# Patient Record
Sex: Male | Born: 1935 | Race: White | Hispanic: No | Smoking: Never smoker
Health system: Southern US, Community
[De-identification: ages and names within clinical notes are randomized; demographics above are authoritative.]

## PROBLEM LIST (undated history)

## (undated) DIAGNOSIS — E785 Hyperlipidemia, unspecified: Secondary | ICD-10-CM

## (undated) DIAGNOSIS — I5033 Acute on chronic diastolic (congestive) heart failure: Secondary | ICD-10-CM

## (undated) DIAGNOSIS — I5032 Chronic diastolic (congestive) heart failure: Secondary | ICD-10-CM

## (undated) DIAGNOSIS — C4490 Unspecified malignant neoplasm of skin, unspecified: Secondary | ICD-10-CM

## (undated) DIAGNOSIS — J90 Pleural effusion, not elsewhere classified: Secondary | ICD-10-CM

## (undated) DIAGNOSIS — I4891 Unspecified atrial fibrillation: Secondary | ICD-10-CM

## (undated) DIAGNOSIS — I341 Nonrheumatic mitral (valve) prolapse: Secondary | ICD-10-CM

## (undated) DIAGNOSIS — Z951 Presence of aortocoronary bypass graft: Secondary | ICD-10-CM

## (undated) DIAGNOSIS — I2510 Atherosclerotic heart disease of native coronary artery without angina pectoris: Secondary | ICD-10-CM

## (undated) DIAGNOSIS — I639 Cerebral infarction, unspecified: Secondary | ICD-10-CM

## (undated) DIAGNOSIS — Z9889 Other specified postprocedural states: Secondary | ICD-10-CM

## (undated) DIAGNOSIS — I340 Nonrheumatic mitral (valve) insufficiency: Secondary | ICD-10-CM

## (undated) HISTORY — DX: Unspecified atrial fibrillation: I48.91

## (undated) HISTORY — DX: Hyperlipidemia, unspecified: E78.5

## (undated) HISTORY — DX: Unspecified malignant neoplasm of skin, unspecified: C44.90

## (undated) HISTORY — PX: SKIN CANCER EXCISION: SHX779

## (undated) HISTORY — DX: Nonrheumatic mitral (valve) prolapse: I34.1

## (undated) HISTORY — DX: Atherosclerotic heart disease of native coronary artery without angina pectoris: I25.10

## (undated) HISTORY — DX: Presence of aortocoronary bypass graft: Z95.1

## (undated) HISTORY — DX: Cerebral infarction, unspecified: I63.9

## (undated) HISTORY — PX: LITHOTRIPSY: SUR834

## (undated) HISTORY — DX: Pleural effusion, not elsewhere classified: J90

---

## 1999-05-23 ENCOUNTER — Encounter: Payer: Self-pay | Admitting: Urology

## 1999-05-23 ENCOUNTER — Ambulatory Visit (HOSPITAL_COMMUNITY): Admission: RE | Admit: 1999-05-23 | Discharge: 1999-05-23 | Payer: Self-pay | Admitting: Urology

## 1999-06-27 ENCOUNTER — Ambulatory Visit (HOSPITAL_COMMUNITY): Admission: RE | Admit: 1999-06-27 | Discharge: 1999-06-27 | Payer: Self-pay | Admitting: Gastroenterology

## 2001-07-09 ENCOUNTER — Ambulatory Visit (HOSPITAL_COMMUNITY): Admission: RE | Admit: 2001-07-09 | Discharge: 2001-07-09 | Payer: Self-pay | Admitting: Emergency Medicine

## 2004-05-31 ENCOUNTER — Ambulatory Visit (HOSPITAL_COMMUNITY): Admission: RE | Admit: 2004-05-31 | Discharge: 2004-05-31 | Payer: Self-pay | Admitting: Gastroenterology

## 2005-09-20 ENCOUNTER — Encounter: Admission: RE | Admit: 2005-09-20 | Discharge: 2005-09-20 | Payer: Self-pay | Admitting: Emergency Medicine

## 2005-09-28 ENCOUNTER — Encounter: Admission: RE | Admit: 2005-09-28 | Discharge: 2005-09-28 | Payer: Self-pay | Admitting: Emergency Medicine

## 2006-09-28 ENCOUNTER — Encounter: Admission: RE | Admit: 2006-09-28 | Discharge: 2006-09-28 | Payer: Self-pay | Admitting: Emergency Medicine

## 2008-06-23 ENCOUNTER — Encounter: Admission: RE | Admit: 2008-06-23 | Discharge: 2008-06-23 | Payer: Self-pay | Admitting: Emergency Medicine

## 2009-07-21 ENCOUNTER — Ambulatory Visit: Payer: Self-pay | Admitting: Vascular Surgery

## 2009-12-25 HISTORY — PX: CORONARY STENT PLACEMENT: SHX1402

## 2010-07-07 ENCOUNTER — Encounter (INDEPENDENT_AMBULATORY_CARE_PROVIDER_SITE_OTHER): Payer: Self-pay | Admitting: Emergency Medicine

## 2010-07-07 ENCOUNTER — Ambulatory Visit (HOSPITAL_COMMUNITY): Admission: RE | Admit: 2010-07-07 | Discharge: 2010-07-07 | Payer: Self-pay | Admitting: Emergency Medicine

## 2010-07-26 ENCOUNTER — Encounter: Payer: Self-pay | Admitting: Cardiovascular Disease

## 2010-07-29 ENCOUNTER — Encounter: Payer: Self-pay | Admitting: Cardiovascular Disease

## 2010-07-29 ENCOUNTER — Encounter: Admission: RE | Admit: 2010-07-29 | Discharge: 2010-07-29 | Payer: Self-pay | Admitting: Cardiology

## 2010-08-02 ENCOUNTER — Ambulatory Visit: Admission: RE | Admit: 2010-08-02 | Payer: Self-pay | Source: Home / Self Care | Admitting: Cardiology

## 2010-08-04 ENCOUNTER — Ambulatory Visit: Payer: Self-pay | Admitting: Cardiovascular Disease

## 2010-08-04 DIAGNOSIS — I2510 Atherosclerotic heart disease of native coronary artery without angina pectoris: Secondary | ICD-10-CM

## 2010-08-04 DIAGNOSIS — I25810 Atherosclerosis of coronary artery bypass graft(s) without angina pectoris: Secondary | ICD-10-CM

## 2010-08-04 HISTORY — DX: Atherosclerotic heart disease of native coronary artery without angina pectoris: I25.10

## 2010-08-04 HISTORY — DX: Atherosclerosis of coronary artery bypass graft(s) without angina pectoris: I25.810

## 2010-08-09 ENCOUNTER — Ambulatory Visit: Payer: Self-pay | Admitting: Cardiovascular Disease

## 2010-08-09 ENCOUNTER — Ambulatory Visit (HOSPITAL_COMMUNITY): Admission: RE | Admit: 2010-08-09 | Discharge: 2010-08-10 | Payer: Self-pay | Admitting: Cardiovascular Disease

## 2011-01-14 ENCOUNTER — Encounter: Payer: Self-pay | Admitting: Emergency Medicine

## 2011-01-26 NOTE — Letter (Signed)
Summary: Cardiac Catheterization Instructions- Main Lab  [employer], Main Office  [address]   [place], [place] [address]   Phone: [phone]  Fax: [phone]     08/04/2010 MRN: [id number]  [facility] [address] [place], [place]  [address]  Dear Mr. Jaffe,   You are scheduled for Cardiac Catheterization on Tuesday August 09, 2010             with Dr. Excell Seltzer.  Please arrive at the [facility] at 7:00      a.m. on the day of your procedure.  1. DIET     _X___ Nothing to eat or drink after midnight except your medications with a sip of water.  2. MAKE SURE YOU TAKE YOUR ASPIRIN AND PLAVIX.  3. _X___ YOU MAY TAKE ALL of your remaining medications with a small amount of water.       4. Plan for one night stay - bring personal belongings (i.e. toothpaste, toothbrush, etc.)  5. Bring a current list of your medications and current insurance cards.  6. Must have a responsible person to drive you home.   7. Someone must be with you for the first 24 hours after you arrive home.  8. Please wear clothes that are easy to get on and off and wear slip-on shoes.  *Special note: Every effort is made to have your procedure done on time.  Occasionally there are emergencies that present themselves at the hospital that may cause delays.  Please be patient if a delay does occur.  If you have any questions after you get home, please call the office at the number listed above.  Julieta Gutting, RN, BSN

## 2011-01-26 NOTE — Letter (Signed)
Summary: [facility]s Office visit Note  [facility] Office visit Note   Imported By: Roderic Ovens 08/30/2010 16:30:59  _____________________________________________________________________  External Attachment:    Type:   Image     Comment:   External Document

## 2011-01-26 NOTE — Cardiovascular Report (Signed)
Summary: Pre-Cath Orders   Pre-Cath Orders   Imported By: Marylou Mccoy 10/05/2010 07:40:29  _____________________________________________________________________  External Attachment:    Type:   Image     Comment:   External Document

## 2011-01-26 NOTE — Assessment & Plan Note (Signed)
Summary: discuss angioplasty   Visit Type:  Initial Consult Referring Hans Rusher:  Viann Fish, M.D. Primary Sky Primo:  Leslee Home, M.D.  CC:  Discuss angioplasty.  History of Present Illness: This is a 77 year-old gentleman referred by Dr Donnie Aho for consideration of PCI. The patient has stable exertional angina, and was recently evaluated by Dr Donnie Aho after an abnormal LV wall motion of a 2D echocardiogram. He underwent exercise stress testing demonstrating anterior ST elevation and inferior ST depression. He also had angina on the treadmill. Diagnostic catheterization was performed and this showed severe stenosis of the proximal LAD/diagonal bifurcation, with the most significant disease in the LAD.  The patient otherwise had nonobstructive disease of the LCx and RCA.  The patient reports exertional chest tightness, which he initially minimized, but now clearly admits to. His symptoms feel like chest tightness and resolve with rest. He denies dyspnea, palitations, lightheadedness, or syncope. He complains of mild leg swelling, worse with the hot temperature.   He has no history of bleeding abnormality or thromboembolism. He does have a longstanding history of mitral valve prolapse but no other past cardiac problems.  Current Medications (verified): 1)  Actonel 35 Mg Tabs (Risedronate Sodium) .... Once A Week 2)  Aspirin 81 Mg Tbec (Aspirin) .... Take 1 Tablet By Mouth Two Times A Day 3)  Multivitamins  Tabs (Multiple Vitamin) .... Take 1 Tablet By Mouth Once A Day 4)  Vitamin D3 1000 Unit Tabs (Cholecalciferol) .... Take 1 Tablet By Mouth Once A Day 5)  Metoprolol Tartrate 25 Mg Tabs (Metoprolol Tartrate) .... Take One Tablet By Mouth Twice A Day 6)  Caltrate 600+d Plus 600-400 Mg-Unit Tabs (Calcium Carbonate-Vit D-Min) .... Take 1 Tablet By Mouth Two Times A Day 7)  Singulair 10 Mg Tabs (Montelukast Sodium) .... Take 1 Tablet By Mouth Once A Day 8)  Crestor Dose ? Marland Kitchen... Take One Tablet  By Mouth Daily. 9)  Plavix 75 Mg Tabs (Clopidogrel Bisulfate) .... Take One Tablet By Mouth Daily  Allergies (verified): No Known Drug Allergies  Past History:  Past medical, surgical, family and social histories (including risk factors) reviewed, and no changes noted (except as noted below).  Past Medical History: CAD with stable angina, severe LAD stenosis at cath August 2011 MVP with MR Hypercholesterolemia Osteopenia    Past Surgical History: Lithotripsy Skin cancer removal  Family History: Reviewed history from 08/03/2010 and no changes required.   Negative for premature atherosclerotic disease.   Social History: Reviewed history from 08/03/2010 and no changes required.  He is widowed.  He has 2 children.  He is retired from the [employer].  He does not smoke cigarettes. Social Etoh. He reports regular exercise.   Review of Systems       Negative except as per HPI   Vital Signs:  Patient profile:   77 year old male Height:      70 inches Weight:      161 pounds BMI:     23.18 Pulse rate:   68 / minute Pulse rhythm:   regular Resp:     18 per minute BP sitting:   118 / 64  (left arm) Cuff size:   large  Vitals Entered By: Vikki Ports (August 04, 2010 3:41 PM)  Physical Exam  General:  Pt is well-developed, alert and oriented, no acute distress HEENT: normal Neck: no thyromegaly           JVP normal, carotid upstrokes normal without bruits Lungs: CTA Chest:  equal expansion  CV: Apical impulse nondisplaced, RRR with 2/6 late systolic murmur at apex Abd: soft, NT, positive BS, no HSM, no bruit Back: no CVA tenderness Ext: trace bilateral pretibial edema        femoral pulses 2+ without bruits        pedal pulses 2+ and equal        telangectasias/varicosities both legs Skin: warm, dry, no rash Neuro: CNII-XII intact,strength 5/5 = b/l    Impression & Recommendations:  Problem # 1:  CORONARY ATHEROSCLEROSIS NATIVE CORONARY ARTERY  (ICD-414.01) I reviewed Mr Holle's catheterization films in detail and discussed his case with Dr Donnie Aho. With exertional angina and significant exercise-induced ischemic EKG changes I think he has a strong indication for revascularization. With single vessel CAD and preserved LVEF in a nondiabetic patient, PCI is certainly appropriate. He does carry a higher risk of periprocedural infarct because of the bifurcational nature of his CAD and he understands this. His diagonal stenosis is not severe and I suspect it will remain patent even with stenting across it.  Will plan on a drug-eluting stent platform and I discussed the need for long-term (at least 12 months) dual antiplatelet therapy with ASA and Plavix. Dr Donnie Aho has already started the patient on all appropriate therapies for his CAD and these will be continued through his interventional procedure. Risks, indication, and alternatives to PCI were reviewed with the patient in detail and he agrees to proceed. His procedure will be scheduled for next week.  Will plan on a transradial approach.  His updated medication list for this problem includes:    Aspirin 81 Mg Tbec (Aspirin) .Marland Kitchen... Take 1 tablet by mouth two times a day    Metoprolol Tartrate 25 Mg Tabs (Metoprolol tartrate) .Marland Kitchen... Take one tablet by mouth twice a day    Plavix 75 Mg Tabs (Clopidogrel bisulfate) .Marland Kitchen... Take one tablet by mouth daily  Orders: Cardiac Catheterization (Cardiac Cath)  Patient Instructions: 1)  Your physician has requested that you have a cardiac catheterization.  Cardiac catheterization is used to diagnose and/or treat various heart conditions. Doctors may recommend this procedure for a number of different reasons. The most common reason is to evaluate chest pain. Chest pain can be a symptom of coronary artery disease (CAD), and cardiac catheterization can show whether plaque is narrowing or blocking your heart's arteries. This procedure is also used to evaluate the  valves, as well as measure the blood flow and oxygen levels in different parts of your heart.  For further information please visit [email or web address]  Please follow instruction sheet, as given.

## 2011-01-26 NOTE — Cardiovascular Report (Signed)
Summary: Pre-Cath Orders  Pre-Cath Orders   Imported By: Marylou Mccoy 10/05/2010 07:44:42  _____________________________________________________________________  External Attachment:    Type:   Image     Comment:   External Document

## 2011-03-10 LAB — BASIC METABOLIC PANEL
BUN: 15 mg/dL (ref 6–23)
CO2: 29 mEq/L (ref 19–32)
GFR calc Af Amer: >60 mL/min (ref >60)
Glucose, Bld: 82 mg/dL (ref 70–99)

## 2011-03-10 LAB — BASIC METABOLIC PANEL WITH GFR
BUN: 19 mg/dL (ref 6–23)
CO2: 30 meq/L (ref 19–32)
Calcium: 9.3 mg/dL (ref 8.4–10.5)
Chloride: 105 meq/L (ref 96–112)
Creatinine, Ser: 1.02 mg/dL (ref 0.4–1.5)
GFR calc non Af Amer: >60 mL/min
Glucose, Bld: 87 mg/dL (ref 70–99)
Potassium: 4 meq/L (ref 3.5–5.1)
Sodium: 141 meq/L (ref 135–145)

## 2011-03-10 LAB — CBC
HCT: 39.6 % (ref 39.0–52.0)
Hemoglobin: 11.7 g/dL — ABNORMAL LOW (ref 13.0–17.0)
Hemoglobin: 11.7 g/dL — ABNORMAL LOW (ref 13.0–17.0)
MCH: 30.8 pg (ref 26.0–34.0)
MCHC: 32.1 g/dL (ref 30.0–36.0)
MCHC: 32.3 g/dL (ref 30.0–36.0)
MCV: 96.1 fL (ref 78.0–100.0)
MCV: 96.1 fL (ref 78.0–100.0)
Platelets: 143 10*3/uL — ABNORMAL LOW (ref 150–400)
Platelets: 145 10*3/uL — ABNORMAL LOW (ref 150–400)
RBC: 3.8 MIL/uL — ABNORMAL LOW (ref 4.22–5.81)
RBC: 4.12 MIL/uL — ABNORMAL LOW (ref 4.22–5.81)
RDW: 12.3 % (ref 11.5–15.5)
RDW: 12.5 % (ref 11.5–15.5)
WBC: 5.5 10*3/uL (ref 4.0–10.5)
WBC: 5.6 10*3/uL (ref 4.0–10.5)
WBC: 6.2 10*3/uL (ref 4.0–10.5)

## 2011-03-10 LAB — PROTIME-INR: Prothrombin Time: 14.1 seconds (ref 11.6–15.2)

## 2011-05-09 NOTE — Procedures (Signed)
LOWER EXTREMITY VENOUS REFLUX EXAM   INDICATION:  Bilateral lower extremity spider veins.   EXAM:  Using color-flow imaging and pulse Doppler spectral analysis, the  right and left common femoral, superficial femoral, popliteal, posterior  tibial, greater and lesser saphenous veins are evaluated.  There is no  evidence suggesting deep venous insufficiency in the right and left  lower extremity.   The right and left saphenofemoral junctions are competent.  The right  and left GSVs are competent with the caliber as described below.   The right and left proximal short saphenous veins demonstrates  competency.   GSV Diameter (used if found to be incompetent only)                                            Right    Left  Proximal Greater Saphenous Vein           cm       cm  Proximal-to-mid-thigh                     cm       cm  Mid thigh                                 cm       cm  Mid-distal thigh                          cm       cm  Distal thigh                              cm       cm  Knee                                      cm       cm   IMPRESSION:  1. The right and left greater saphenous veins demonstrate competency.  2. The right and left greater saphenous veins are not aneurysmal.  3. The right and left greater saphenous veins are not tortuous.  4. The deep venous system is competent.  5. The right and left lesser saphenous veins are competent.  6. There is no evidence of deep vein thrombosis in lower extremity.   ___________________________________________  Larina Earthly, M.D.   AC/MEDQ  D:  07/21/2009  T:  07/22/2009  Job:  [id number]

## 2011-05-09 NOTE — Consult Note (Signed)
NEW PATIENT CONSULTATION   Jack James, Jack James  DOB:  March 05, 1934                                       07/21/2009  [id number]#:[id number]   The patient presents today for evaluation of venous pathology.  He is a  very pleasant, healthy 77 year old gentleman concerned regarding  telangiectasia and reticular veins in his lower extremities.  He reports  a chronic aching sensation in his calves which can occur with exercise  and with rest and was concerned that these may be related.  He does not  have any claudication-type symptoms.  He does not have any history of  deep venous thrombosis.   His history is negative for hypertension, elevated cholesterol or  cardiac disease.   FAMILY HISTORY:  Negative for premature atherosclerotic disease.   SOCIAL HISTORY:  He is widowed.  He has 2 children.  He is retired.  He  does not smoke or drink alcohol.   REVIEW OF SYSTEMS:  His weight is 155 pounds.  He is 5 feet 11 inches  tall.  He does have history of a heart murmur and leg discomfort.  Otherwise, review of systems is totally negative.   MEDICATION ALLERGIES:  None.   MEDICATIONS:  Actonel 35 mg weekly, and he is on a daily aspirin and  vitamin supplements.   PHYSICAL EXAMINATION:  His blood pressure is 140/74, pulse 64,  respirations 18.  His radial and posterior tibial pulses are 2+  bilaterally.  He does have multiple scattered telangiectasia over his  medial and lateral thighs and medial and lateral calves bilaterally.  On  the left he does have some reticular veins over the medial calf  extending to his knee.  He does not have any swelling or changes of  venous hypertension.   He underwent noninvasive vascular laboratory studies in our office, and  this reveals totally normal deep system.  Superficial system has no  evidence of valvular incompetence in his great or small saphenous veins.   I discussed this at length with the patient.  I explained that he should  not need to curtail his activity and I do not feel that he has any  venous or arterial pathology that could explain his calf soreness.  He  is concerned regarding the appearance of his telangiectasia, and I did  discuss a treatment option with sclerotherapy and the estimated out-of-  pocket expense related to this.  He will consider this and notify us if  he wishes to proceed with treatment.  Otherwise, he will see me on an as-  needed basis.   Larina Earthly, M.D.  Electronically Signed   TFE/MEDQ  D:  07/21/2009  T:  07/22/2009  Job:  [id number]   cc:   Reuben Likes, M.D.

## 2011-05-12 NOTE — Op Note (Signed)
NAME:  Jack James, Jack James                         ACCOUNT NO.:  [id number]   MEDICAL RECORD NO.:  [id number]                   PATIENT TYPE:  AMB   LOCATION:  ENDO                                 FACILITY:  [facility]   PHYSICIAN:  Petra Kuba, M.D.                 DATE OF BIRTH:  May 09, 1934   DATE OF PROCEDURE:  05/31/2004  DATE OF DISCHARGE:                                 OPERATIVE REPORT   PROCEDURE:  Colonoscopy.   INDICATION:  The patient with a history of colon polyps due for repeat  screening.  Consent was signed after risks, benefits, methods, options  thoroughly discussed multiple times in the past.   MEDICINES USED:  1. Demerol 70.  2. Versed 7.   DESCRIPTION OF PROCEDURE:  Rectal inspection was pertinent for external  hemorrhoids, small.  Digital exam was negative.  Video colonoscope was  inserted, fairly easily advanced around the colon to the cecum.  This did  require some abdominal pressure but no position changes.  No abnormality was  seen on insertion.  The cecum was identified by the appendiceal orifice and  the ileocecal valve.  The prep was adequate.  There was some liquid stool  that required washing and suctioning.  On slow withdrawal through the colon,  no polyps, tumors, masses, or even signs of diverticula were seen as we  slowly withdrew back to the rectum.  Anorectal pull-through and retroflexion  confirmed some small hemorrhoids.  The scope was reinserted a short ways up  the left side of the colon; air was suctioned, scope removed.  The patient  tolerated the procedure well.  There was no obvious immediate complication.   ENDOSCOPIC DIAGNOSES:  1. Internal-external small hemorrhoids.  2. Otherwise, within normal limits to the cecum.   PLAN:  1. Happy to see back p.r.n..  2. Recheck colonoscopy in 5 years if doing well medically.  3. Yearly rectals and guaiacs per Dr. Lorenz Coaster.                                               Petra Kuba, M.D.    MEM/MEDQ  D:  05/31/2004  T:  05/31/2004  Job:  [id number]   cc:   Reuben Likes, M.D.  [address]  [place]  [place] [address]  Fax: [phone]

## 2013-04-21 ENCOUNTER — Encounter (INDEPENDENT_AMBULATORY_CARE_PROVIDER_SITE_OTHER): Payer: Medicare Other | Admitting: *Deleted

## 2013-04-21 DIAGNOSIS — R609 Edema, unspecified: Secondary | ICD-10-CM

## 2013-06-19 ENCOUNTER — Other Ambulatory Visit: Payer: Self-pay | Admitting: Family Medicine

## 2013-06-19 DIAGNOSIS — E049 Nontoxic goiter, unspecified: Secondary | ICD-10-CM

## 2013-06-30 ENCOUNTER — Other Ambulatory Visit (HOSPITAL_COMMUNITY): Payer: Self-pay | Admitting: Family Medicine

## 2013-06-30 DIAGNOSIS — R011 Cardiac murmur, unspecified: Secondary | ICD-10-CM

## 2013-07-01 ENCOUNTER — Ambulatory Visit (HOSPITAL_COMMUNITY): Payer: Medicare Other | Attending: Cardiology | Admitting: Radiology

## 2013-07-01 ENCOUNTER — Ambulatory Visit
Admission: RE | Admit: 2013-07-01 | Discharge: 2013-07-01 | Disposition: A | Discharge status: Home / Self Care | Payer: Medicare Other | Source: Ambulatory Visit | Attending: Family Medicine | Admitting: Family Medicine

## 2013-07-01 DIAGNOSIS — I2510 Atherosclerotic heart disease of native coronary artery without angina pectoris: Secondary | ICD-10-CM | POA: Insufficient documentation

## 2013-07-01 DIAGNOSIS — E049 Nontoxic goiter, unspecified: Secondary | ICD-10-CM

## 2013-07-01 DIAGNOSIS — R011 Cardiac murmur, unspecified: Secondary | ICD-10-CM | POA: Insufficient documentation

## 2013-07-01 DIAGNOSIS — I059 Rheumatic mitral valve disease, unspecified: Secondary | ICD-10-CM | POA: Insufficient documentation

## 2013-07-01 NOTE — Progress Notes (Signed)
Echocardiogram performed.  

## 2013-07-02 ENCOUNTER — Other Ambulatory Visit: Payer: Self-pay | Admitting: Family Medicine

## 2013-07-02 DIAGNOSIS — E041 Nontoxic single thyroid nodule: Secondary | ICD-10-CM

## 2013-07-08 ENCOUNTER — Other Ambulatory Visit: Payer: Medicare Other

## 2013-07-09 ENCOUNTER — Telehealth: Payer: Self-pay | Admitting: Cardiovascular Disease

## 2013-07-09 NOTE — Telephone Encounter (Deleted)
error 

## 2013-07-15 ENCOUNTER — Other Ambulatory Visit (HOSPITAL_COMMUNITY)
Admission: RE | Admit: 2013-07-15 | Discharge: 2013-07-15 | Disposition: A | Discharge status: Home / Self Care | Payer: Medicare Other | Source: Ambulatory Visit | Attending: Interventional Radiology | Admitting: Interventional Radiology

## 2013-07-15 ENCOUNTER — Ambulatory Visit
Admission: RE | Admit: 2013-07-15 | Discharge: 2013-07-15 | Disposition: A | Discharge status: Home / Self Care | Payer: Medicare Other | Source: Ambulatory Visit | Attending: Family Medicine | Admitting: Family Medicine

## 2013-07-15 DIAGNOSIS — E041 Nontoxic single thyroid nodule: Secondary | ICD-10-CM

## 2013-07-15 DIAGNOSIS — E049 Nontoxic goiter, unspecified: Secondary | ICD-10-CM | POA: Insufficient documentation

## 2014-01-26 ENCOUNTER — Other Ambulatory Visit (HOSPITAL_COMMUNITY): Payer: Self-pay | Admitting: Family Medicine

## 2014-01-26 ENCOUNTER — Ambulatory Visit (HOSPITAL_COMMUNITY)
Admission: RE | Admit: 2014-01-26 | Discharge: 2014-01-26 | Disposition: A | Discharge status: Home / Self Care | Payer: Medicare Other | Source: Ambulatory Visit | Attending: Surgery | Admitting: Surgery

## 2014-01-26 DIAGNOSIS — M7989 Other specified soft tissue disorders: Secondary | ICD-10-CM

## 2014-01-26 DIAGNOSIS — M7120 Synovial cyst of popliteal space [Baker], unspecified knee: Secondary | ICD-10-CM | POA: Insufficient documentation

## 2014-01-27 ENCOUNTER — Other Ambulatory Visit: Payer: Self-pay | Admitting: Family Medicine

## 2014-01-27 ENCOUNTER — Other Ambulatory Visit (HOSPITAL_COMMUNITY): Payer: Self-pay | Admitting: Interventional Radiology

## 2014-01-27 DIAGNOSIS — I872 Venous insufficiency (chronic) (peripheral): Secondary | ICD-10-CM

## 2014-02-04 ENCOUNTER — Ambulatory Visit
Admission: RE | Admit: 2014-02-04 | Discharge: 2014-02-04 | Disposition: A | Discharge status: Home / Self Care | Payer: Medicare Other | Source: Ambulatory Visit | Attending: Family Medicine | Admitting: Family Medicine

## 2014-02-04 DIAGNOSIS — I872 Venous insufficiency (chronic) (peripheral): Secondary | ICD-10-CM

## 2014-02-11 ENCOUNTER — Other Ambulatory Visit (HOSPITAL_COMMUNITY): Payer: Self-pay | Admitting: Interventional Radiology

## 2014-02-11 ENCOUNTER — Other Ambulatory Visit: Payer: Self-pay | Admitting: Family Medicine

## 2014-02-11 DIAGNOSIS — M7120 Synovial cyst of popliteal space [Baker], unspecified knee: Secondary | ICD-10-CM

## 2014-02-11 DIAGNOSIS — I83812 Varicose veins of left lower extremities with pain: Secondary | ICD-10-CM

## 2014-02-26 ENCOUNTER — Ambulatory Visit
Admission: RE | Admit: 2014-02-26 | Discharge: 2014-02-26 | Disposition: A | Discharge status: Home / Self Care | Payer: Medicare Other | Source: Ambulatory Visit | Attending: Family Medicine | Admitting: Family Medicine

## 2014-02-26 ENCOUNTER — Ambulatory Visit
Admission: RE | Admit: 2014-02-26 | Discharge: 2014-02-26 | Disposition: A | Discharge status: Home / Self Care | Payer: Medicare Other | Source: Ambulatory Visit | Attending: Interventional Radiology | Admitting: Interventional Radiology

## 2014-02-26 ENCOUNTER — Other Ambulatory Visit (HOSPITAL_COMMUNITY): Payer: Self-pay | Admitting: Interventional Radiology

## 2014-02-26 DIAGNOSIS — I83812 Varicose veins of left lower extremities with pain: Secondary | ICD-10-CM

## 2014-02-26 DIAGNOSIS — M7120 Synovial cyst of popliteal space [Baker], unspecified knee: Secondary | ICD-10-CM

## 2014-04-07 ENCOUNTER — Ambulatory Visit
Admission: RE | Admit: 2014-04-07 | Discharge: 2014-04-07 | Disposition: A | Discharge status: Home / Self Care | Payer: Medicare Other | Source: Ambulatory Visit | Attending: Interventional Radiology | Admitting: Interventional Radiology

## 2014-04-07 ENCOUNTER — Other Ambulatory Visit (HOSPITAL_COMMUNITY): Payer: Self-pay | Admitting: Interventional Radiology

## 2014-04-07 DIAGNOSIS — I83812 Varicose veins of left lower extremities with pain: Secondary | ICD-10-CM

## 2014-04-29 ENCOUNTER — Ambulatory Visit
Admission: RE | Admit: 2014-04-29 | Discharge: 2014-04-29 | Disposition: A | Discharge status: Home / Self Care | Payer: Medicare Other | Source: Ambulatory Visit | Attending: Interventional Radiology | Admitting: Interventional Radiology

## 2014-04-29 ENCOUNTER — Other Ambulatory Visit: Payer: Self-pay | Admitting: Family Medicine

## 2014-04-29 ENCOUNTER — Other Ambulatory Visit (HOSPITAL_COMMUNITY): Payer: Self-pay | Admitting: Interventional Radiology

## 2014-04-29 DIAGNOSIS — M25569 Pain in unspecified knee: Secondary | ICD-10-CM

## 2014-04-29 DIAGNOSIS — I83812 Varicose veins of left lower extremities with pain: Secondary | ICD-10-CM

## 2014-06-24 ENCOUNTER — Other Ambulatory Visit: Payer: Self-pay | Admitting: Family Medicine

## 2014-06-24 DIAGNOSIS — E042 Nontoxic multinodular goiter: Secondary | ICD-10-CM

## 2014-07-08 ENCOUNTER — Other Ambulatory Visit: Payer: Self-pay | Admitting: Family Medicine

## 2014-07-08 DIAGNOSIS — M7122 Synovial cyst of popliteal space [Baker], left knee: Secondary | ICD-10-CM

## 2014-07-14 ENCOUNTER — Other Ambulatory Visit: Payer: Medicare Other

## 2014-07-21 ENCOUNTER — Ambulatory Visit
Admission: RE | Admit: 2014-07-21 | Discharge: 2014-07-21 | Disposition: A | Discharge status: Home / Self Care | Payer: Medicare Other | Source: Ambulatory Visit | Attending: Family Medicine | Admitting: Family Medicine

## 2014-07-21 DIAGNOSIS — E042 Nontoxic multinodular goiter: Secondary | ICD-10-CM

## 2014-07-21 DIAGNOSIS — M7122 Synovial cyst of popliteal space [Baker], left knee: Secondary | ICD-10-CM

## 2014-08-05 ENCOUNTER — Other Ambulatory Visit: Payer: Self-pay | Admitting: Gastroenterology

## 2015-11-18 DIAGNOSIS — J90 Pleural effusion, not elsewhere classified: Secondary | ICD-10-CM

## 2015-11-18 HISTORY — DX: Pleural effusion, not elsewhere classified: J90

## 2015-11-19 ENCOUNTER — Encounter (HOSPITAL_COMMUNITY)
Admission: EM | Disposition: A | Discharge status: Home / Self Care | Payer: Medicare Other | Source: Home / Self Care | Attending: Cardiovascular Disease

## 2015-11-19 ENCOUNTER — Telehealth: Payer: Self-pay | Admitting: Student

## 2015-11-19 ENCOUNTER — Inpatient Hospital Stay (HOSPITAL_COMMUNITY)
Admission: EM | Admit: 2015-11-19 | Discharge: 2015-11-24 | DRG: 286 | Disposition: A | Discharge status: Home / Self Care | Payer: Medicare Other | Attending: Cardiology | Admitting: Cardiology

## 2015-11-19 ENCOUNTER — Encounter (HOSPITAL_COMMUNITY): Payer: Self-pay | Admitting: Emergency Medicine

## 2015-11-19 ENCOUNTER — Emergency Department (HOSPITAL_COMMUNITY): Payer: Medicare Other

## 2015-11-19 DIAGNOSIS — I2510 Atherosclerotic heart disease of native coronary artery without angina pectoris: Secondary | ICD-10-CM | POA: Diagnosis present

## 2015-11-19 DIAGNOSIS — I25110 Atherosclerotic heart disease of native coronary artery with unstable angina pectoris: Secondary | ICD-10-CM

## 2015-11-19 DIAGNOSIS — I110 Hypertensive heart disease with heart failure: Secondary | ICD-10-CM | POA: Diagnosis not present

## 2015-11-19 DIAGNOSIS — I25810 Atherosclerosis of coronary artery bypass graft(s) without angina pectoris: Secondary | ICD-10-CM | POA: Diagnosis present

## 2015-11-19 DIAGNOSIS — D649 Anemia, unspecified: Secondary | ICD-10-CM | POA: Diagnosis present

## 2015-11-19 DIAGNOSIS — I200 Unstable angina: Secondary | ICD-10-CM

## 2015-11-19 DIAGNOSIS — I341 Nonrheumatic mitral (valve) prolapse: Secondary | ICD-10-CM

## 2015-11-19 DIAGNOSIS — Z7982 Long term (current) use of aspirin: Secondary | ICD-10-CM

## 2015-11-19 DIAGNOSIS — I272 Other secondary pulmonary hypertension: Secondary | ICD-10-CM | POA: Diagnosis present

## 2015-11-19 DIAGNOSIS — I5033 Acute on chronic diastolic (congestive) heart failure: Secondary | ICD-10-CM

## 2015-11-19 DIAGNOSIS — I340 Nonrheumatic mitral (valve) insufficiency: Secondary | ICD-10-CM | POA: Insufficient documentation

## 2015-11-19 DIAGNOSIS — E785 Hyperlipidemia, unspecified: Secondary | ICD-10-CM | POA: Diagnosis present

## 2015-11-19 DIAGNOSIS — J449 Chronic obstructive pulmonary disease, unspecified: Secondary | ICD-10-CM | POA: Diagnosis present

## 2015-11-19 DIAGNOSIS — I872 Venous insufficiency (chronic) (peripheral): Secondary | ICD-10-CM | POA: Diagnosis present

## 2015-11-19 DIAGNOSIS — I5032 Chronic diastolic (congestive) heart failure: Secondary | ICD-10-CM | POA: Diagnosis present

## 2015-11-19 DIAGNOSIS — I511 Rupture of chordae tendineae, not elsewhere classified: Secondary | ICD-10-CM | POA: Diagnosis present

## 2015-11-19 DIAGNOSIS — I2584 Coronary atherosclerosis due to calcified coronary lesion: Secondary | ICD-10-CM | POA: Diagnosis present

## 2015-11-19 DIAGNOSIS — Z8249 Family history of ischemic heart disease and other diseases of the circulatory system: Secondary | ICD-10-CM

## 2015-11-19 DIAGNOSIS — Z955 Presence of coronary angioplasty implant and graft: Secondary | ICD-10-CM

## 2015-11-19 DIAGNOSIS — R072 Precordial pain: Secondary | ICD-10-CM | POA: Diagnosis not present

## 2015-11-19 DIAGNOSIS — I509 Heart failure, unspecified: Secondary | ICD-10-CM

## 2015-11-19 HISTORY — DX: Hyperlipidemia, unspecified: E78.5

## 2015-11-19 HISTORY — DX: Nonrheumatic mitral (valve) prolapse: I34.1

## 2015-11-19 HISTORY — DX: Nonrheumatic mitral (valve) insufficiency: I34.0

## 2015-11-19 HISTORY — DX: Acute on chronic diastolic (congestive) heart failure: I50.33

## 2015-11-19 HISTORY — DX: Unstable angina: I20.0

## 2015-11-19 HISTORY — DX: Atherosclerotic heart disease of native coronary artery without angina pectoris: I25.10

## 2015-11-19 HISTORY — PX: CARDIAC CATHETERIZATION: SHX172

## 2015-11-19 HISTORY — DX: Chronic diastolic (congestive) heart failure: I50.32

## 2015-11-19 LAB — CBC
HEMATOCRIT: 40.5 % (ref 39.0–52.0)
HEMOGLOBIN: 12.8 g/dL — AB (ref 13.0–17.0)
MCH: 30.9 pg (ref 26.0–34.0)
MCHC: 31.6 g/dL (ref 30.0–36.0)
MCV: 97.8 fL (ref 78.0–100.0)
Platelets: 178 10*3/uL (ref 150–400)
RBC: 4.14 MIL/uL — ABNORMAL LOW (ref 4.22–5.81)
RDW: 13 % (ref 11.5–15.5)
WBC: 8.5 10*3/uL (ref 4.0–10.5)

## 2015-11-19 LAB — PROTIME-INR
INR: 1.25 (ref 0.00–1.49)
PROTHROMBIN TIME: 15.9 s — AB (ref 11.6–15.2)

## 2015-11-19 LAB — BASIC METABOLIC PANEL
ANION GAP: 5 (ref 5–15)
BUN: 24 mg/dL — AB (ref 6–20)
CHLORIDE: 107 mmol/L (ref 101–111)
CO2: 25 mmol/L (ref 22–32)
Calcium: 8.6 mg/dL — ABNORMAL LOW (ref 8.9–10.3)
Creatinine, Ser: 1.03 mg/dL (ref 0.61–1.24)
GFR calc Af Amer: >60 mL/min (ref >60)
GLUCOSE: 119 mg/dL — AB (ref 65–99)
POTASSIUM: 3.9 mmol/L (ref 3.5–5.1)
Sodium: 137 mmol/L (ref 135–145)

## 2015-11-19 LAB — TROPONIN I
Troponin I: <0.03 ng/mL (ref <0.031)
Troponin I: 0.03 ng/mL (ref <0.031)

## 2015-11-19 LAB — I-STAT TROPONIN, ED: Troponin i, poc: 0.02 ng/mL (ref 0.00–0.08)

## 2015-11-19 LAB — BRAIN NATRIURETIC PEPTIDE: B NATRIURETIC PEPTIDE 5: 188.4 pg/mL — AB (ref 0.0–100.0)

## 2015-11-19 SURGERY — LEFT HEART CATH AND CORONARY ANGIOGRAPHY
Anesthesia: LOCAL

## 2015-11-19 MED ORDER — HEPARIN (PORCINE) IN NACL 100-0.45 UNIT/ML-% IJ SOLN
850.0000 [IU]/h | INTRAMUSCULAR | Status: DC
  Administered 2015-11-19: 850 [IU]/h via INTRAVENOUS
  Filled 2015-11-19: qty 250

## 2015-11-19 MED ORDER — SODIUM CHLORIDE 0.9 % IJ SOLN
3.0000 mL | Freq: Two times a day (BID) | INTRAMUSCULAR | Status: DC
  Administered 2015-11-19 – 2015-11-24 (×10): 3 mL via INTRAVENOUS

## 2015-11-19 MED ORDER — NITROGLYCERIN 0.4 MG SL SUBL
0.4000 mg | SUBLINGUAL_TABLET | SUBLINGUAL | Status: DC | PRN
  Administered 2015-11-19 (×2): 0.4 mg via SUBLINGUAL
  Filled 2015-11-19: qty 1

## 2015-11-19 MED ORDER — MIDAZOLAM HCL 2 MG/2ML IJ SOLN
INTRAMUSCULAR | Status: DC | PRN
  Administered 2015-11-19 (×2): 1 mg via INTRAVENOUS

## 2015-11-19 MED ORDER — ACETAMINOPHEN 325 MG PO TABS
650.0000 mg | ORAL_TABLET | ORAL | Status: DC | PRN
Start: 2015-11-19 — End: 2015-11-24
  Administered 2015-11-19 – 2015-11-22 (×2): 650 mg via ORAL
  Filled 2015-11-19 (×2): qty 2

## 2015-11-19 MED ORDER — LIDOCAINE HCL (PF) 1 % IJ SOLN
INTRAMUSCULAR | Status: DC | PRN
  Administered 2015-11-19: 15:00:00

## 2015-11-19 MED ORDER — HEPARIN SODIUM (PORCINE) 1000 UNIT/ML IJ SOLN
INTRAMUSCULAR | Status: AC
  Filled 2015-11-19: qty 1

## 2015-11-19 MED ORDER — FENTANYL CITRATE (PF) 100 MCG/2ML IJ SOLN
INTRAMUSCULAR | Status: DC | PRN
  Administered 2015-11-19 (×2): 25 ug via INTRAVENOUS

## 2015-11-19 MED ORDER — SODIUM CHLORIDE 0.9 % IV SOLN
INTRAVENOUS | Status: AC
  Administered 2015-11-19: 16:00:00 via INTRAVENOUS

## 2015-11-19 MED ORDER — LIDOCAINE HCL (PF) 1 % IJ SOLN
INTRAMUSCULAR | Status: AC
  Filled 2015-11-19: qty 30

## 2015-11-19 MED ORDER — FENTANYL CITRATE (PF) 100 MCG/2ML IJ SOLN
INTRAMUSCULAR | Status: AC
  Filled 2015-11-19: qty 2

## 2015-11-19 MED ORDER — FAMOTIDINE 20 MG PO TABS: 10.0000 mg | ORAL_TABLET | Freq: Two times a day (BID) | ORAL | Status: DC | PRN

## 2015-11-19 MED ORDER — SODIUM CHLORIDE 0.9 % IJ SOLN: 3.0000 mL | INTRAMUSCULAR | Status: DC | PRN

## 2015-11-19 MED ORDER — MONTELUKAST SODIUM 10 MG PO TABS
10.0000 mg | ORAL_TABLET | Freq: Every day | ORAL | Status: DC
  Administered 2015-11-19 – 2015-11-23 (×5): 10 mg via ORAL
  Filled 2015-11-19 (×5): qty 1

## 2015-11-19 MED ORDER — ASPIRIN EC 81 MG PO TBEC
81.0000 mg | DELAYED_RELEASE_TABLET | Freq: Every day | ORAL | Status: DC
  Administered 2015-11-20 – 2015-11-24 (×4): 81 mg via ORAL
  Filled 2015-11-19 (×4): qty 1

## 2015-11-19 MED ORDER — VERAPAMIL HCL 2.5 MG/ML IV SOLN
INTRAVENOUS | Status: AC
Start: 2015-11-19 — End: 2015-11-19
  Filled 2015-11-19: qty 2

## 2015-11-19 MED ORDER — IOHEXOL 350 MG/ML SOLN
INTRAVENOUS | Status: DC | PRN
  Administered 2015-11-19: 60 mL via INTRA_ARTERIAL

## 2015-11-19 MED ORDER — SODIUM CHLORIDE 0.9 % IV SOLN: Freq: Once | INTRAVENOUS | Status: DC

## 2015-11-19 MED ORDER — FUROSEMIDE 10 MG/ML IJ SOLN
20.0000 mg | Freq: Once | INTRAMUSCULAR | Status: AC
  Administered 2015-11-19: 20 mg via INTRAVENOUS
  Filled 2015-11-19: qty 2

## 2015-11-19 MED ORDER — ONDANSETRON HCL 4 MG/2ML IJ SOLN: 4.0000 mg | Freq: Four times a day (QID) | INTRAMUSCULAR | Status: DC | PRN

## 2015-11-19 MED ORDER — HEPARIN SODIUM (PORCINE) 1000 UNIT/ML IJ SOLN
INTRAMUSCULAR | Status: DC | PRN
  Administered 2015-11-19: 3500 [IU] via INTRAVENOUS

## 2015-11-19 MED ORDER — ATORVASTATIN CALCIUM 20 MG PO TABS
20.0000 mg | ORAL_TABLET | Freq: Every day | ORAL | Status: DC
  Administered 2015-11-19 – 2015-11-23 (×5): 20 mg via ORAL
  Filled 2015-11-19 (×5): qty 1

## 2015-11-19 MED ORDER — SODIUM CHLORIDE 0.9 % IV SOLN: 250.0000 mL | INTRAVENOUS | Status: DC | PRN

## 2015-11-19 MED ORDER — MIDAZOLAM HCL 2 MG/2ML IJ SOLN
INTRAMUSCULAR | Status: AC
  Filled 2015-11-19: qty 2

## 2015-11-19 MED ORDER — ASPIRIN 81 MG PO CHEW
324.0000 mg | CHEWABLE_TABLET | Freq: Once | ORAL | Status: AC
  Administered 2015-11-19: 162 mg via ORAL
  Filled 2015-11-19: qty 4

## 2015-11-19 MED ORDER — HEPARIN BOLUS VIA INFUSION
4000.0000 [IU] | Freq: Once | INTRAVENOUS | Status: AC
  Administered 2015-11-19: 4000 [IU] via INTRAVENOUS
  Filled 2015-11-19: qty 4000

## 2015-11-19 MED ORDER — NITROGLYCERIN 0.4 MG SL SUBL: 0.4000 mg | SUBLINGUAL_TABLET | SUBLINGUAL | Status: DC | PRN

## 2015-11-19 MED ORDER — SODIUM CHLORIDE 0.9 % IJ SOLN: 3.0000 mL | Freq: Two times a day (BID) | INTRAMUSCULAR | Status: DC

## 2015-11-19 MED ORDER — HEPARIN (PORCINE) IN NACL 2-0.9 UNIT/ML-% IJ SOLN
INTRAMUSCULAR | Status: AC
  Filled 2015-11-19: qty 1000

## 2015-11-19 SURGICAL SUPPLY — 13 items
CATH INFINITI 5 FR JL3.5 (CATHETERS) ×2 IMPLANT
CATH INFINITI 5FR ANG PIGTAIL (CATHETERS) ×2 IMPLANT
CATH INFINITI JR4 5F (CATHETERS) ×2 IMPLANT
CATH VISTA GUIDE 6FR XBLAD3.5 (CATHETERS) ×2 IMPLANT
DEVICE RAD COMP TR BAND LRG (VASCULAR PRODUCTS) ×2 IMPLANT
GLIDESHEATH SLEND SS 6F .021 (SHEATH) ×2 IMPLANT
KIT HEART LEFT (KITS) ×2 IMPLANT
PACK CARDIAC CATHETERIZATION (CUSTOM PROCEDURE TRAY) ×2 IMPLANT
SYR MEDRAD MARK V 150ML (SYRINGE) ×2 IMPLANT
TRANSDUCER W/STOPCOCK (MISCELLANEOUS) ×2 IMPLANT
TUBING CIL FLEX 10 FLL-RA (TUBING) ×2 IMPLANT
WIRE HI TORQ VERSACORE-J 145CM (WIRE) ×2 IMPLANT
WIRE SAFE-T 1.5MM-J .035X260CM (WIRE) ×2 IMPLANT

## 2015-11-19 NOTE — ED Notes (Signed)
Pt.; stated, I started having chest pain and SOB on Wednesday and just waiting. It feels the sam.  I have a stent.  I had a Nitrostat this morning and a ASA.  I couldn't tell any difference.

## 2015-11-19 NOTE — ED Provider Notes (Signed)
CSN: [id number]     Arrival date & time 11/19/15  0804 History   First MD Initiated Contact with Patient 11/19/15 [phone]     Chief Complaint  Patient presents with  . Chest Pain  . Shortness of Breath     (Consider location/radiation/quality/duration/timing/severity/associated sxs/prior Treatment) HPI Comments: Wednesday chest tightness SOB with activity Constant tightness, waxes and wanes, in center of chest, pain the bilateral shoulders Tightness increases a little with exertion No nausea/diaphoresis Dry mouth Had stent placed in past but no CP prior to that, Aug 2011, Dr. Wynonia Lawman Cardiologist Tried old nitro from 2011, however it did not help CAD and cholesterol, borderline htn   Patient is a 81 y.o. male presenting with chest pain and shortness of breath.  Chest Pain Associated symptoms: fatigue, headache (on and off) and shortness of breath   Associated symptoms: no abdominal pain, no back pain, no fever, no nausea and not vomiting   Shortness of Breath Associated symptoms: chest pain and headaches (on and off)   Associated symptoms: no abdominal pain, no fever, no rash, no sore throat and no vomiting     Past Medical History  Diagnosis Date  . Coronary artery disease     a. 2011: DES to proximal LAD  . HLD (hyperlipidemia)   . Mitral valve prolapse   . Chronic diastolic CHF (congestive heart failure) [facility])    Past Surgical History  Procedure Laterality Date  . Coronary stent placement  2011    DES to proximal LAD   Family History  Problem Relation Age of Onset  . Heart failure Mother   . Stroke Sister    Social History  Substance Use Topics  . Smoking status: Never Smoker   . Smokeless tobacco: Never Used  . Alcohol Use: 2.5 - 3.0 oz/week    5-6 Standard drinks or equivalent per week     Comment: wine    Review of Systems  Constitutional: Positive for fatigue. Negative for fever.  HENT: Negative for sore throat.   Eyes: Negative for visual disturbance.   Respiratory: Positive for shortness of breath.   Cardiovascular: Positive for chest pain.  Gastrointestinal: Negative for nausea, vomiting, abdominal pain, diarrhea and constipation.  Genitourinary: Negative for difficulty urinating.  Musculoskeletal: Negative for back pain and neck stiffness.  Skin: Negative for rash.  Neurological: Positive for headaches (on and off). Negative for syncope.      Allergies  Review of patient's allergies indicates not on file.  Home Medications   Prior to Admission medications   Medication Sig Start Date End Date Taking? Authorizing Provider  aspirin EC 81 MG tablet Take 81 mg by mouth daily.   Yes Historical Provider, MD  atorvastatin (LIPITOR) 20 MG tablet Take 20 mg by mouth daily.   Yes Historical Provider, MD  cholecalciferol (VITAMIN D) 1000 UNITS tablet Take 2,000 Units by mouth daily.   Yes Historical Provider, MD  famotidine (PEPCID) 10 MG tablet Take 10 mg by mouth as needed for heartburn or indigestion.   Yes Historical Provider, MD  hyoscyamine (LEVSIN, ANASPAZ) 0.125 MG tablet Take 0.125 mg by mouth every 4 (four) hours as needed for cramping.   Yes Historical Provider, MD  ibuprofen (ADVIL,MOTRIN) 200 MG tablet Take 200 mg by mouth every 6 (six) hours as needed (pain).   Yes Historical Provider, MD  montelukast (SINGULAIR) 10 MG tablet Take 10 mg by mouth at bedtime.   Yes Historical Provider, MD  nitroGLYCERIN (NITROSTAT) 0.4 MG SL tablet Place  0.4 mg under the tongue every 5 (five) minutes as needed for chest pain.   Yes Historical Provider, MD   BP 114/76 mmHg  Pulse 95  Temp(Src) 98.1 F (36.7 C) (Oral)  Resp 18  Ht 5\' 11"  (1.803 m)  Wt 160 lb 8 oz (72.802 kg)  BMI 22.40 kg/m2  SpO2 95% Physical Exam  Constitutional: He is oriented to person, place, and time. He appears well-developed and well-nourished. No distress.  HENT:  Head: Normocephalic and atraumatic.  Eyes: Conjunctivae and EOM are normal.  Neck: Normal range of  motion.  Cardiovascular: Normal rate, regular rhythm and intact distal pulses.  Exam reveals no gallop and no friction rub.   Murmur heard. Pulmonary/Chest: Effort normal and breath sounds normal. No respiratory distress. He has no wheezes. He has no rales.  Abdominal: Soft. He exhibits no distension. There is no tenderness. There is no guarding.  Musculoskeletal: He exhibits no edema.  Neurological: He is alert and oriented to person, place, and time.  Skin: Skin is warm and dry. He is not diaphoretic.  Nursing note and vitals reviewed.   ED Course  Procedures (including critical care time) Labs Review Labs Reviewed  BASIC METABOLIC PANEL - Abnormal; Notable for the following:    Glucose, Bld 119 (*)    BUN 24 (*)    Calcium 8.6 (*)    All other components within normal limits  CBC - Abnormal; Notable for the following:    RBC 4.14 (*)    Hemoglobin 12.8 (*)    All other components within normal limits  BRAIN NATRIURETIC PEPTIDE - Abnormal; Notable for the following:    B Natriuretic Peptide 188.4 (*)    All other components within normal limits  PROTIME-INR - Abnormal; Notable for the following:    Prothrombin Time 15.9 (*)    All other components within normal limits  TROPONIN I  TROPONIN I  TROPONIN I  I-STAT TROPOININ, ED    Imaging Review Dg Chest 2 View  11/19/2015  CLINICAL DATA:  Chest pain and shortness of breath. EXAM: CHEST  2 VIEW COMPARISON:  07/29/2010 FINDINGS: The patient has not moderate bilateral pleural effusions and interstitial pulmonary edema as well as pulmonary vascular congestion superimposed on emphysema. Chronic bilateral apical pleural thickening. Overall heart size is normal. Chronic slight accentuation of the thoracic kyphosis. IMPRESSION: Congestive heart failure superimposed on COPD. Interstitial pulmonary edema and moderate effusions. Electronically Signed   By: Lorriane Shire M.D.   On: 11/19/2015 08:42   I have personally reviewed and  evaluated these images and lab results as part of my medical decision-making.   EKG Interpretation   Date/Time:  Friday November 19 2015 08:10:58 EST Ventricular Rate:  105 PR Interval:  181 QRS Duration: 87 QT Interval:  347 QTC Calculation: 459 R Axis:   82 Text Interpretation:  Sinus tachycardia Borderline right axis deviation  Nonspecific TW changes Other than increase in heart rate, no significant  changes since prior ECG Confirmed by [facility] MD, Watts ([id number]) on  11/19/2015 8:16:41 AM      MDM   Final diagnoses:  Unstable angina ([place])   81 year old male with history CAD, hlpd presents with concern of chest pain. Differential diagnosis for chest pain includes pulmonary embolus, dissection, pneumothorax, pneumonia, ACS, myocarditis, pericarditis.  EKG was done and evaluate by me and showed no acute ST changes and no signs of pericarditis. Chest x-ray was done and evaluated by me and radiology and showed no sign of  pneumonia or pneumothorax however signs of CHF.  Troponin negative.  Low suspicion for PE, dissection, by history and physical exam and feel history of chest tightness worse with exertion most concerning for angina or unstable angina.  [facility]. Pt given ASA/nitro with improvement in pain.  Will be admitted to Cardiology for concern of unstable angina and CHF.     Gareth Morgan, MD 11/19/15 [phone]

## 2015-11-19 NOTE — Interval H&P Note (Signed)
History and Physical Interval Note:  11/19/2015 2:37 PM  Jack James  has presented today for cardiac cath with the diagnosis of unstable angina. The various methods of treatment have been discussed with the patient and family. After consideration of risks, benefits and other options for treatment, the patient has consented to  Procedure(s): Left Heart Cath and Coronary Angiography (N/A) as a surgical intervention .  The patient's history has been reviewed, patient examined, no change in status, stable for surgery.  I have reviewed the patient's chart and labs.  Questions were answered to the patient's satisfaction.    Cath Lab Visit (complete for each Cath Lab visit)  Clinical Evaluation Leading to the Procedure:   ACS: No.  Non-ACS:    Anginal Classification: CCS III  Anti-ischemic medical therapy: No Therapy  Non-Invasive Test Results: No non-invasive testing performed  Prior CABG: No previous CABG        Jack James

## 2015-11-19 NOTE — Plan of Care (Signed)
Problem: Pain Managment: Goal: General experience of comfort will improve Outcome: Completed/Met Date Met:  11/19/15 Pt educated on pain scale and interventions. Pt verbalized understanding. No complaints of pain at this time.

## 2015-11-19 NOTE — ED Notes (Signed)
Pt has history of one cardiac stent and states chest pain started on Wednesday.  Describes as constant and tight.  Pt reports pain 8/10 now.  NSR on monitor.  Took ASA this am.

## 2015-11-19 NOTE — Progress Notes (Signed)
TR band removed at 17:15. No bruising or hematoma present. VSS. No complaints of pain at this time. Will continue to monitor.   Ruben Reason, RN

## 2015-11-19 NOTE — H&P (Addendum)
Patient ID: Jack James MRN: [id number], DOB/AGE: [birth year]   Admit date: 11/19/2015   Primary Physician: Marylene Land, MD Primary Cardiologist: Dr. Wynonia Lawman   [id number] B Hill is a 81 y.o. male with past medical history of CAD (s/p DES to proximal LAD in 2011), chronic diastolic CHF, MVP, and HLD who presents to [facility] ED on 11/19/2015 for evaluation of chest pain.  The patient reports he has been experiencing episodes of sternal chest pressure since Wednesday (11/17/2015), which is worse with exertion. He reports it is accompanied by shortness of breath and radiating pain to his left shoulder. He reports the dyspnea comes on with walking short distances, such as to his mail box, which did not produce symptoms prior to Wednesday. He also endorses having a dry mouth. Denies any nausea, vomiting, or diaphoresis.   He took expired SL NTG this morning and says it provided minimal relief. He has since received ASA and SL NTG while in the ED and says his pain has improved.   His initial troponin value is negative. BNP is elevated to 188. Hgb at 12.8 (history of chronic anemia). His EKG shows NSR without acute ST or T-wave changes. His CXR shows CHF superimposed on COPD along with interstitial pulmonary edema and moderate effusions. He denies having ever been told he has COPD, yet "Severe COPD" was noted on a CXR in 2011.  He underwent previous cardiac catheterization by Dr. Wynonia Lawman in August 2011 following an abnormal stress test which showed 95% stenosis in the mid-LAD and 40-50% stenosis in the circumflex. He later underwent intervention by Dr. Burt Knack with placement of Ion DES to the proximal LAD later that month.  His last echocardiogram was in 06/2013 and showed an EF of 55-60%. He had Grade 2 Diastolic Dysfunction noted at that time as well.  Home Medications Prior to Admission medications   Medication Sig Start Date End Date Taking? Authorizing Provider  aspirin EC 81 MG  tablet Take 81 mg by mouth daily.   Yes Historical Provider, MD  atorvastatin (LIPITOR) 20 MG tablet Take 20 mg by mouth daily.   Yes Historical Provider, MD  cholecalciferol (VITAMIN D) 1000 UNITS tablet Take 2,000 Units by mouth daily.   Yes Historical Provider, MD  famotidine (PEPCID) 10 MG tablet Take 10 mg by mouth as needed for heartburn or indigestion.   Yes Historical Provider, MD  hyoscyamine (LEVSIN, ANASPAZ) 0.125 MG tablet Take 0.125 mg by mouth every 4 (four) hours as needed for cramping.   Yes Historical Provider, MD  ibuprofen (ADVIL,MOTRIN) 200 MG tablet Take 200 mg by mouth every 6 (six) hours as needed (pain).   Yes Historical Provider, MD  montelukast (SINGULAIR) 10 MG tablet Take 10 mg by mouth at bedtime.   Yes Historical Provider, MD  nitroGLYCERIN (NITROSTAT) 0.4 MG SL tablet Place 0.4 mg under the tongue every 5 (five) minutes as needed for chest pain.   Yes Historical Provider, MD    Allergies Not on File  Past Medical History Past Medical History  Diagnosis Date  . Coronary artery disease     a. 2011: DES to proximal LAD  . HLD (hyperlipidemia)      Surgical History Past Surgical History  Procedure Laterality Date  . Coronary stent placement  2011    DES to proximal LAD     Family History Family History  Problem Relation Age of Onset  . Heart failure Mother   . Stroke Sister  Social History Social History   Social History  . Marital Status: Widowed    Spouse Name: N/A  . Number of Children: N/A  . Years of Education: N/A   Occupational History  . Not on file.   Social History Main Topics  . Smoking status: Never Smoker   . Smokeless tobacco: Never Used  . Alcohol Use: 2.5 - 3.0 oz/week    5-6 Standard drinks or equivalent per week     Comment: wine  . Drug Use: No  . Sexual Activity: Not on file   Other Topics Concern  . Not on file   Social History Narrative     Review of Systems General:  No chills, fever, night sweats or  weight changes.  Cardiovascular:  No edema, orthopnea, palpitations, paroxysmal nocturnal dyspnea. Positive for chest pain and dyspnea on exertion. Dermatological: No rash, lesions/masses Respiratory: No cough, Positive for dyspnea Urologic: No hematuria, dysuria Abdominal:   No nausea, vomiting, diarrhea, bright red blood per rectum, melena, or hematemesis Neurologic:  No visual changes, wkns, changes in mental status. All other systems reviewed and are otherwise negative except as noted above.   Physical Exam: Blood pressure 114/65, pulse 81, temperature 97.7 F (36.5 C), temperature source Oral, resp. rate 22, height 5\' 11"  (1.803 m), weight 155 lb (70.308 kg), SpO2 92 %.  General: Well developed, well nourished,male in no acute distress. Head: Normocephalic, atraumatic, sclera non-icteric, no xanthomas, nares are without discharge. Dentition:  Neck: No carotid bruits. JVD not elevated.  Lungs: Respirations regular and unlabored, without wheezes or rales.  Heart: Regular rate and rhythm. No S3 or S4.  3/6 holosystolic murmur at the apex, no rubs, or gallops appreciated. Abdomen: Soft, non-tender, non-distended with normoactive bowel sounds. No hepatomegaly. No rebound/guarding. No obvious abdominal masses. Msk:  Strength and tone appear normal for age. No joint deformities or effusions. Extremities: No clubbing or cyanosis. No edema.  Distal pedal pulses are 2+ bilaterally. Neuro: Alert and oriented X 3. Moves all extremities spontaneously. No focal deficits noted. Psych:  Responds to questions appropriately with a normal affect. Skin: No rashes or lesions noted   Labs Lab Results  Component Value Date   WBC 8.5 11/19/2015   HGB 12.8* 11/19/2015   HCT 40.5 11/19/2015   MCV 97.8 11/19/2015   PLT 178 11/19/2015     Recent Labs Lab 11/19/15 0832  NA 137  K 3.9  CL 107  CO2 25  BUN 24*  CREATININE 1.03  CALCIUM 8.6*  GLUCOSE 119*   Troponin (Point of Care  Test)  Recent Labs  11/19/15 0833  TROPIPOC 0.02    B NATRIURETIC PEPTIDE  Date/Time Value Ref Range Status  11/19/2015 08:32 AM 188.4* 0.0 - 100.0 pg/mL Final    ECG: Sinus tachycardiac with rate at 105. No ST or T-wave changes noted.  Echo: 06/2013 Study Conclusions - Left ventricle: The cavity size was normal. Wall thickness was normal. Systolic function was normal. The estimated ejection fraction was in the range of 55% to 60%. Wall motion was normal; there were no regional wall motion abnormalities. Features are consistent with a pseudonormal left ventricular filling pattern, with concomitant abnormal relaxation and increased filling pressure (grade 2 diastolic dysfunction). - Mitral valve: Moderate prolapse, involving the anterior leaflet and the posterior leaflet. Mild to moderate regurgitation directed posteriorly. - Left atrium: The atrium was mildly dilated.   Radiology/Studies: Dg Chest 2 View: 11/19/2015  CLINICAL DATA:  Chest pain and shortness of breath. EXAM:  CHEST  2 VIEW COMPARISON:  07/29/2010 FINDINGS: The patient has not moderate bilateral pleural effusions and interstitial pulmonary edema as well as pulmonary vascular congestion superimposed on emphysema. Chronic bilateral apical pleural thickening. Overall heart size is normal. Chronic slight accentuation of the thoracic kyphosis. IMPRESSION: Congestive heart failure superimposed on COPD. Interstitial pulmonary edema and moderate effusions. Electronically Signed   By: Lorriane Shire M.D.   On: 11/19/2015 08:42   Cardiac Catheterization: 07/2010   ASSESSMENT AND PLAN:   1. Unstable Angina - having sternal chest pressure for two days, accompanied by dyspnea on exertion and radiating pain to his left shoulder. Relieved with SL NTG. - initial troponin negative. Will cycle cardiac enzymes.  - will add-on for LHC today with Dr. Angelena Form. Had donut this morning at 0630. Nothing since. Will  keep NPO until after procedure. - continue ASA and statin. Will start heparin. Consider addition of ACE-I and BB following cath results.  2. History of CAD - s/p DES to proximal LAD in 2011 - continue ASA and statin  3. HLD (hyperlipidemia) - will check lipid panel - continue statin  4. Acute Diastolic HF - Echo in [id number] showed EF of 55-60% with Grade 2 Diastolic Dysfunction - BNP elevated to 188. - give lasix 20 mg IV x 1 - Check 2D echo to assess for change in LV function or mitral valve pathology  5. Mitral Valve Prolapse - will obtain repeat echocardiogram  Signed, Erma Heritage, PA-C 11/19/2015, 10:45 AM Pager: [phone]  Patient seen, examined. Available data reviewed. Agree with findings, assessment, and plan as outlined by Bernerd Pho, PA-C. The patient is independently interviewed and examined. Changes made above where appropriate. He is known to me from a previous PCI procedure in 2011. Exam reveals an alert, oriented, somewhat anxious male in no distress. Lung fields are clear. Heart is regular rate and rhythm with a 3/6 holosystolic murmur at the apex. Abdomen is soft and nontender. There is no peripheral edema. Chest x-ray shows changes consistent with congestive heart failure with interstitial edema and bilateral effusions. Initial cardiac markers are negative, but BNP is elevated.  The patient's symptoms are primarily consistent with unstable angina pectoris. With his known CAD and crescendo pattern of symptoms, I have recommended cardiac catheterization and possible PCI. I have reviewed the risks, indications, and alternatives to cardiac catheterization with the patient. Risks include but are not limited to bleeding, infection, vascular injury, stroke, myocardial infection, arrhythmia, kidney injury, radiation-related injury in the case of prolonged fluoroscopy use, emergency cardiac surgery, and death. The patient understands the risks of serious complication  is low (123456).   The patient's elevated BNP and findings on chest x-ray are concerning for acute on chronic diastolic heart failure. 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 His murmur is impressive on examination. It's possible that he has has had chordal rupture or some other mechanism for acutely worsening mitral regurgitation leading to these findings. Will repeat an echocardiogram and give him a dose of IV Lasix.  Further disposition pending the results of the patient's heart catheterization and echocardiogram.  Sherren Mocha, M.D. 11/19/2015 11:21 AM

## 2015-11-19 NOTE — Progress Notes (Signed)
ANTICOAGULATION CONSULT NOTE - Initial Consult  Pharmacy Consult for heparin Indication: chest pain/ACS  Not on File  Patient Measurements: Height: 5\' 11"  (180.3 cm) Weight: 155 lb (70.308 kg) IBW/kg (Calculated) : 75.3 Heparin Dosing Weight: 70.3kg  Vital Signs: Temp: 97.7 F (36.5 C) (11/25 0812) Temp Source: Oral (11/25 0812) BP: 117/67 mmHg (11/25 1100) Pulse Rate: 92 (11/25 1100)  Labs:  Recent Labs  11/19/15 0832  HGB 12.8*  HCT 40.5  PLT 178  CREATININE 1.03    Estimated Creatinine Clearance: 58.8 mL/min (by C-G formula based on Cr of 1.03).   Medical History: Past Medical History  Diagnosis Date  . Coronary artery disease     a. 2011: DES to proximal LAD  . HLD (hyperlipidemia)   . Mitral valve prolapse   . Chronic diastolic CHF (congestive heart failure) (HCC)     Assessment: 81 yom with CAD hx s/p DES in 2011 presents CP/SOB x 1 day on admit. Pharmacy consulted to dose heparin for CP/ACS (no anticoag pta). Hg 12.8, plt wnl. No bleed documented. LHC this afternoon  Goal of Therapy:  Heparin level 0.3-0.7 units/ml Monitor platelets by anticoagulation protocol: Yes   Plan:  Heparin 4000 unit bolus Heparin @ 850 units/h 8h HL Daily HL/CBC Mon s/sx bleeding Cath this afternoon  Elicia Lamp, PharmD Clinical Pharmacist Pager [phone] 11/19/2015 11:27 AM

## 2015-11-19 NOTE — ED Notes (Signed)
Patient transported to X-ray 

## 2015-11-19 NOTE — Plan of Care (Signed)
Problem: Safety: Goal: Ability to remain free from injury will improve Outcome: Completed/Met Date Met:  11/19/15 Pt educated on safety measures put into place. Pt verbalized understanding. Call bell within reach.

## 2015-11-19 NOTE — Telephone Encounter (Addendum)
Patient called saying he has been having episodes of chest pressure since Wednesday which have increased in severity. Associated with dyspnea on exertion and accompanied by pain radiating to his left shoulder.  Is a patient of Dr. Wynonia Lawman and says he has a cardiac history with most recent stent placed in 2011.   Patient's daughter Claiborne Billings) is at home with him and she will bring him to [facility] ED this morning. They live in [place] and will be here within the next hour.  He is not having active pain at this time and his daughter feels comfortable driving him. Instructed if his pain represents itself or his symptoms become worse, then they need to consider calling EMS for transport. Has SL NTG and instructed him to take it (every 5 minutes up to 3 times) if his pain comes back.   Signed, Erma Heritage, PA-C 11/19/2015, 6:49 AM Pager: [phone]

## 2015-11-20 ENCOUNTER — Observation Stay (HOSPITAL_BASED_OUTPATIENT_CLINIC_OR_DEPARTMENT_OTHER): Payer: Medicare Other

## 2015-11-20 ENCOUNTER — Observation Stay (HOSPITAL_COMMUNITY): Payer: Medicare Other

## 2015-11-20 DIAGNOSIS — I2510 Atherosclerotic heart disease of native coronary artery without angina pectoris: Secondary | ICD-10-CM

## 2015-11-20 DIAGNOSIS — E785 Hyperlipidemia, unspecified: Secondary | ICD-10-CM | POA: Diagnosis present

## 2015-11-20 DIAGNOSIS — I340 Nonrheumatic mitral (valve) insufficiency: Secondary | ICD-10-CM

## 2015-11-20 DIAGNOSIS — I511 Rupture of chordae tendineae, not elsewhere classified: Secondary | ICD-10-CM | POA: Diagnosis present

## 2015-11-20 DIAGNOSIS — I5032 Chronic diastolic (congestive) heart failure: Secondary | ICD-10-CM | POA: Diagnosis not present

## 2015-11-20 DIAGNOSIS — I272 Other secondary pulmonary hypertension: Secondary | ICD-10-CM | POA: Diagnosis present

## 2015-11-20 DIAGNOSIS — I5033 Acute on chronic diastolic (congestive) heart failure: Secondary | ICD-10-CM | POA: Diagnosis present

## 2015-11-20 DIAGNOSIS — Z7982 Long term (current) use of aspirin: Secondary | ICD-10-CM | POA: Diagnosis not present

## 2015-11-20 DIAGNOSIS — D649 Anemia, unspecified: Secondary | ICD-10-CM | POA: Diagnosis present

## 2015-11-20 DIAGNOSIS — J449 Chronic obstructive pulmonary disease, unspecified: Secondary | ICD-10-CM | POA: Diagnosis present

## 2015-11-20 DIAGNOSIS — Z955 Presence of coronary angioplasty implant and graft: Secondary | ICD-10-CM | POA: Diagnosis not present

## 2015-11-20 DIAGNOSIS — I2584 Coronary atherosclerosis due to calcified coronary lesion: Secondary | ICD-10-CM | POA: Diagnosis present

## 2015-11-20 DIAGNOSIS — R072 Precordial pain: Secondary | ICD-10-CM | POA: Diagnosis present

## 2015-11-20 DIAGNOSIS — I341 Nonrheumatic mitral (valve) prolapse: Secondary | ICD-10-CM

## 2015-11-20 DIAGNOSIS — Z8249 Family history of ischemic heart disease and other diseases of the circulatory system: Secondary | ICD-10-CM | POA: Diagnosis not present

## 2015-11-20 DIAGNOSIS — I351 Nonrheumatic aortic (valve) insufficiency: Secondary | ICD-10-CM | POA: Diagnosis not present

## 2015-11-20 DIAGNOSIS — I110 Hypertensive heart disease with heart failure: Secondary | ICD-10-CM | POA: Diagnosis present

## 2015-11-20 DIAGNOSIS — I872 Venous insufficiency (chronic) (peripheral): Secondary | ICD-10-CM | POA: Diagnosis present

## 2015-11-20 DIAGNOSIS — I25110 Atherosclerotic heart disease of native coronary artery with unstable angina pectoris: Secondary | ICD-10-CM | POA: Diagnosis present

## 2015-11-20 LAB — BASIC METABOLIC PANEL
ANION GAP: 6 (ref 5–15)
BUN: 23 mg/dL — ABNORMAL HIGH (ref 6–20)
CALCIUM: 8.1 mg/dL — AB (ref 8.9–10.3)
CO2: 25 mmol/L (ref 22–32)
Chloride: 107 mmol/L (ref 101–111)
Creatinine, Ser: 1.15 mg/dL (ref 0.61–1.24)
GFR, EST NON AFRICAN AMERICAN: 59 mL/min — AB (ref >60)
GLUCOSE: 101 mg/dL — AB (ref 65–99)
Potassium: 3.8 mmol/L (ref 3.5–5.1)
Sodium: 138 mmol/L (ref 135–145)

## 2015-11-20 LAB — CBC
HEMATOCRIT: 37.3 % — AB (ref 39.0–52.0)
Hemoglobin: 11.9 g/dL — ABNORMAL LOW (ref 13.0–17.0)
MCH: 30.9 pg (ref 26.0–34.0)
MCHC: 31.9 g/dL (ref 30.0–36.0)
MCV: 96.9 fL (ref 78.0–100.0)
Platelets: 184 10*3/uL (ref 150–400)
RBC: 3.85 MIL/uL — ABNORMAL LOW (ref 4.22–5.81)
RDW: 13 % (ref 11.5–15.5)
WBC: 7 10*3/uL (ref 4.0–10.5)

## 2015-11-20 LAB — LIPID PANEL
Cholesterol: 102 mg/dL (ref 0–200)
HDL: 48 mg/dL (ref >40)
LDL Cholesterol: 47 mg/dL (ref 0–99)
Total CHOL/HDL Ratio: 2.1 RATIO
Triglycerides: 36 mg/dL (ref <150)
VLDL: 7 mg/dL (ref 0–40)

## 2015-11-20 LAB — TROPONIN I: TROPONIN I: 0.06 ng/mL — AB (ref <0.031)

## 2015-11-20 MED ORDER — SODIUM CHLORIDE 0.9 % IV SOLN: INTRAVENOUS | Status: DC

## 2015-11-20 NOTE — Progress Notes (Signed)
  Echocardiogram 2D Echocardiogram has been performed.  Jack James 11/20/2015, 11:45 AM

## 2015-11-20 NOTE — Progress Notes (Signed)
Patient ID: Jack James, male   DOB: [birth year], 81 y.o.   MRN: [id number]    Subjective:  Still with dyspnea and tightness in chest   Objective:  Filed Vitals:   11/19/15 1706 11/19/15 1735 11/19/15 2100 11/20/15 0500  BP: 99/82 106/60 132/67 109/62  Pulse: 106 95 106 95  Temp:   97.7 F (36.5 C) 99.8 F (37.7 C)  TempSrc:   Oral Oral  Resp: 24 15 20 18   Height:      Weight:    71.986 kg (158 lb 11.2 oz)  SpO2: 92% 94% 94% 90%    Intake/Output from previous day:  Intake/Output Summary (Last 24 hours) at 11/20/15 [id number] Last data filed at 11/20/15 [id number]  Gross per 24 hour  Intake 142.56 ml  Output   1440 ml  Net -1297.44 ml    Physical Exam: Affect appropriate Healthy:  appears stated age HEENT: normal Neck supple with no adenopathy JVP normal bilateral carotid  bruits no thyromegaly Lungs clear with no wheezing and good diaphragmatic motion Heart:  S1/S2 loud MR  murmur, no rub, gallop or click PMI normal Abdomen: benighn, BS positve, no tenderness, no AAA no bruit.  No HSM or HJR Distal pulses intact with no bruits No edema Neuro non-focal Skin warm and dry No muscular weakness   Lab Results: Basic Metabolic Panel:  Recent Labs  11/19/15 0832 11/20/15 0030  NA 137 138  K 3.9 3.8  CL 107 107  CO2 25 25  GLUCOSE 119* 101*  BUN 24* 23*  CREATININE 1.03 1.15  CALCIUM 8.6* 8.1*   CBC:  Recent Labs  11/19/15 0832 11/20/15 0030  WBC 8.5 7.0  HGB 12.8* 11.9*  HCT 40.5 37.3*  MCV 97.8 96.9  PLT 178 184   Cardiac Enzymes:  Recent Labs  11/19/15 1230 11/19/15 1827 11/20/15 0030  TROPONINI <0.03 0.03 0.06*   Fasting Lipid Panel:  Recent Labs  11/20/15 0030  CHOL 102  HDL 48  LDLCALC 47  TRIG 36  CHOLHDL 2.1    Imaging: Dg Chest 2 View  11/19/2015  CLINICAL DATA:  Chest pain and shortness of breath. EXAM: CHEST  2 VIEW COMPARISON:  07/29/2010 FINDINGS: The patient has not moderate bilateral pleural effusions and interstitial  pulmonary edema as well as pulmonary vascular congestion superimposed on emphysema. Chronic bilateral apical pleural thickening. Overall heart size is normal. Chronic slight accentuation of the thoracic kyphosis. IMPRESSION: Congestive heart failure superimposed on COPD. Interstitial pulmonary edema and moderate effusions. Electronically Signed   By: Lorriane Shire M.D.   On: 11/19/2015 08:42    Cardiac Studies:  ECG:  Orders placed or performed during the hospital encounter of 11/19/15  . EKG 12-Lead  . EKG 12-Lead  . EKG 12-lead  . EKG 12-Lead  . EKG 12-Lead     Telemetry:  NSR 11/20/2015   Echo: pending  Medications:   . aspirin EC  81 mg Oral Daily  . atorvastatin  20 mg Oral q1800  . montelukast  10 mg Oral QHS  . sodium chloride  3 mL Intravenous Q12H       Assessment/Plan:  CAD:  Patent stent to LAD by cath yesterday with no new obstructive disease.  R/O  Continue ASA MR:  Loud murmur with signs of CHF  Lasix x1 today echo pending if severe will need TEE MOnday Chol:  On statin    Jenkins Rouge 11/20/2015, 9:26 AM

## 2015-11-20 NOTE — Progress Notes (Signed)
Patient ID: Jack James, male   DOB: [birth year], 81 y.o.   MRN: [id number] Reviewed TTE Has likely flail segment to posterior leaflet With CHF presentation will likely need MV repair Have left message with Dr Roxy Manns Set up TEE for Monday with Dr. Debara Pickett Discussed at length with patient and daughter They are in agreement  Jenkins Rouge

## 2015-11-21 LAB — CBC
HEMATOCRIT: 38.8 % — AB (ref 39.0–52.0)
HEMOGLOBIN: 12.7 g/dL — AB (ref 13.0–17.0)
MCH: 31.8 pg (ref 26.0–34.0)
MCHC: 32.7 g/dL (ref 30.0–36.0)
MCV: 97 fL (ref 78.0–100.0)
Platelets: 191 10*3/uL (ref 150–400)
RBC: 4 MIL/uL — ABNORMAL LOW (ref 4.22–5.81)
RDW: 13 % (ref 11.5–15.5)
WBC: 8 10*3/uL (ref 4.0–10.5)

## 2015-11-21 NOTE — Progress Notes (Signed)
Patient ID: Jack James, male   DOB: 11-05-1934, 81 y.o.   MRN: [id number]    Subjective:  Ambulating less dyspnea   Objective:  Filed Vitals:   11/20/15 0500 11/20/15 1305 11/20/15 2100 11/21/15 0500  BP: 109/62 116/55 116/60 98/53  Pulse: 95 101 95 89  Temp: 99.8 F (37.7 C) 98.4 F (36.9 C) 98.9 F (37.2 C) 99.6 F (37.6 C)  TempSrc: Oral Oral Oral Oral  Resp: 18 16 16 16   Height:      Weight: 71.986 kg (158 lb 11.2 oz)   71.94 kg (158 lb 9.6 oz)  SpO2: 90% 93% 94% 90%    Intake/Output from previous day:  Intake/Output Summary (Last 24 hours) at 11/21/15 [id number] Last data filed at 11/21/15 0825  Gross per 24 hour  Intake    720 ml  Output    650 ml  Net     70 ml    Physical Exam: Affect appropriate Healthy:  appears stated age HEENT: normal Neck supple with no adenopathy JVP normal bilateral carotid  bruits no thyromegaly Lungs clear with no wheezing and good diaphragmatic motion Heart:  S1/S2 loud MR  murmur, no rub, gallop or click PMI normal Abdomen: benighn, BS positve, no tenderness, no AAA no bruit.  No HSM or HJR Distal pulses intact with no bruits No edema Neuro non-focal Skin warm and dry No muscular weakness   Lab Results: Basic Metabolic Panel:  Recent Labs  11/19/15 0832 11/20/15 0030  NA 137 138  K 3.9 3.8  CL 107 107  CO2 25 25  GLUCOSE 119* 101*  BUN 24* 23*  CREATININE 1.03 1.15  CALCIUM 8.6* 8.1*   CBC:  Recent Labs  11/20/15 0030 11/21/15 0342  WBC 7.0 8.0  HGB 11.9* 12.7*  HCT 37.3* 38.8*  MCV 96.9 97.0  PLT 184 191   Cardiac Enzymes:  Recent Labs  11/19/15 1230 11/19/15 1827 11/20/15 0030  TROPONINI <0.03 0.03 0.06*   Fasting Lipid Panel:  Recent Labs  11/20/15 0030  CHOL 102  HDL 48  LDLCALC 47  TRIG 36  CHOLHDL 2.1    Imaging: No results found.  Cardiac Studies:  ECG:    Telemetry:  NSR 11/21/2015   Echo: Severe MR with likely flail posterior leaflet  Medications:   . aspirin EC   81 mg Oral Daily  . atorvastatin  20 mg Oral q1800  . montelukast  10 mg Oral QHS  . sodium chloride  3 mL Intravenous Q12H       Assessment/Plan:  CAD:  Patent stent to LAD by cath  with no new obstructive disease.  R/O  Continue ASA MR:  Likely flail posterior leaflet  TEE with Dr Debara Pickett tomorrow.  Have contacted Dr Roxy Manns He is in office tomorrow but will see patient latter in afternoon after TEE Chol:  On statin   Discussed at length with patient and daughter yesterday son today   Jenkins Rouge 11/21/2015, 8:52 AM

## 2015-11-22 ENCOUNTER — Encounter (HOSPITAL_COMMUNITY)
Admission: EM | Disposition: A | Discharge status: Home / Self Care | Payer: Self-pay | Source: Home / Self Care | Attending: Cardiovascular Disease

## 2015-11-22 ENCOUNTER — Other Ambulatory Visit: Payer: Self-pay | Admitting: *Deleted

## 2015-11-22 ENCOUNTER — Encounter (HOSPITAL_COMMUNITY): Payer: Self-pay | Admitting: Cardiovascular Disease

## 2015-11-22 ENCOUNTER — Inpatient Hospital Stay (HOSPITAL_COMMUNITY): Payer: Medicare Other

## 2015-11-22 DIAGNOSIS — I2510 Atherosclerotic heart disease of native coronary artery without angina pectoris: Secondary | ICD-10-CM

## 2015-11-22 DIAGNOSIS — I340 Nonrheumatic mitral (valve) insufficiency: Secondary | ICD-10-CM

## 2015-11-22 DIAGNOSIS — I351 Nonrheumatic aortic (valve) insufficiency: Secondary | ICD-10-CM

## 2015-11-22 HISTORY — PX: TEE WITHOUT CARDIOVERSION: SHX5443

## 2015-11-22 LAB — CBC
HCT: 39.3 % (ref 39.0–52.0)
Hemoglobin: 13 g/dL (ref 13.0–17.0)
MCH: 31.9 pg (ref 26.0–34.0)
MCHC: 33.1 g/dL (ref 30.0–36.0)
MCV: 96.3 fL (ref 78.0–100.0)
Platelets: 200 K/uL (ref 150–400)
RBC: 4.08 MIL/uL — ABNORMAL LOW (ref 4.22–5.81)
RDW: 13 % (ref 11.5–15.5)
WBC: 9.2 K/uL (ref 4.0–10.5)

## 2015-11-22 SURGERY — ECHOCARDIOGRAM, TRANSESOPHAGEAL
Anesthesia: Moderate Sedation

## 2015-11-22 MED ORDER — FENTANYL CITRATE (PF) 100 MCG/2ML IJ SOLN
INTRAMUSCULAR | Status: DC | PRN
Start: 2015-11-22 — End: 2015-11-22
  Administered 2015-11-22 (×2): 25 ug via INTRAVENOUS

## 2015-11-22 MED ORDER — LIDOCAINE VISCOUS 2 % MT SOLN
OROMUCOSAL | Status: AC
  Filled 2015-11-22: qty 15

## 2015-11-22 MED ORDER — MIDAZOLAM HCL 5 MG/ML IJ SOLN
INTRAMUSCULAR | Status: AC
  Filled 2015-11-22: qty 2

## 2015-11-22 MED ORDER — FENTANYL CITRATE (PF) 100 MCG/2ML IJ SOLN
INTRAMUSCULAR | Status: AC
  Filled 2015-11-22: qty 2

## 2015-11-22 MED ORDER — MIDAZOLAM HCL 10 MG/2ML IJ SOLN
INTRAMUSCULAR | Status: DC | PRN
  Administered 2015-11-22: 1 mg via INTRAVENOUS
  Administered 2015-11-22: 2 mg via INTRAVENOUS
  Administered 2015-11-22: 1 mg via INTRAVENOUS
  Administered 2015-11-22: 2 mg via INTRAVENOUS

## 2015-11-22 MED ORDER — HYDROCORTISONE 1 % EX CREA
TOPICAL_CREAM | CUTANEOUS | Status: DC | PRN
  Administered 2015-11-22: 1 via TOPICAL
  Filled 2015-11-22: qty 28

## 2015-11-22 MED ORDER — LIDOCAINE VISCOUS 2 % MT SOLN
OROMUCOSAL | Status: DC | PRN
  Administered 2015-11-22: 5 mL via OROMUCOSAL

## 2015-11-22 MED ORDER — HYDROCORTISONE 0.5 % EX CREA: TOPICAL_CREAM | CUTANEOUS | Status: DC | PRN

## 2015-11-22 MED ORDER — DIPHENHYDRAMINE HCL 50 MG/ML IJ SOLN
INTRAMUSCULAR | Status: AC
  Filled 2015-11-22: qty 1

## 2015-11-22 MED ORDER — BUTAMBEN-TETRACAINE-BENZOCAINE 2-2-14 % EX AERO
INHALATION_SPRAY | CUTANEOUS | Status: DC | PRN
  Administered 2015-11-22: 2 via TOPICAL

## 2015-11-22 MED FILL — Verapamil HCl IV Soln 2.5 MG/ML: INTRAVENOUS | Qty: 2 | Status: AC

## 2015-11-22 NOTE — Consult Note (Signed)
[address]       [place] [address]             [phone]          CARDIOTHORACIC SURGERY CONSULTATION REPORT  PCP is Marylene Land, MD Referring Provider is Josue Hector, MD Primary Cardiologist is Jacolyn Reedy, MD   Reason for consultation:  Severe mitral regurgitation and coronary artery disease  HPI:  Patient is a 81 year old male with history of mitral valve prolapse, coronary artery disease, and chronic diastolic congestive heart failure who was admitted to the hospital 11/19/2015 with symptoms consistent with unstable angina and acute exacerbation of chronic diastolic congestive heart failure and has been referred for surgical consultation to discuss treatment options for management of mitral regurgitation and coronary artery disease. The patient states that he has been told that he had a heart murmur for many years, as long as he can remember. In 2011 the patient was noted to report occasional symptoms of substernal chest tightness with exertion and his primary care physician noted a change in the character of his murmur and he was referred for cardiology consultation.  A stress test was performed demonstrating findings consistent with anterior wall ischemia, and he subsequently underwent diagnostic cardiac catheterization followed by PCI and stenting of the proximal left anterior descending coronary artery using a drug-eluting stent. He has done well clinically since that time and has been followed intermittently by Dr. Wynonia Lawman.  Early last week the patient began to experience worsening symptoms of exertional chest tightness and shortness of breath. Symptoms progressed over several days, prompting presentation to the hospital on 11/19/2015.  Serial troponin levels remained minimally elevated to a peak troponin I level of 0.06. BNP level was mildly elevated to 188 on admission and Chest x-ray revealed interstitial pulmonary edema and moderate effusions  consistent with acute exacerbation of congestive heart failure superimposed on COPD.  The patient underwent left heart catheterization by Dr. Angelena Form revealing moderate multivessel coronary artery disease including patent stent in the proximal left anterior descending coronary artery with 50% stenosis of the mid left anterior descending coronary artery beyond the distal end of the stent, 65% proximal stenosis of a large left circumflex coronary artery, and 40-50% stenosis of the mid right coronary artery. Right heart catheterization was not performed.  Transthoracic echocardiogram performed 11/20/2015 revealed bileaflet prolapse of the mitral valve with a likely flail segment of the posterior leaflet and severe mitral regurgitation. Transesophageal echocardiogram has been scheduled and cardia thoracic surgical consultation was requested.  The patient is a widower and lives alone in [place], [place].  He has a daughter who lives in [place] and is at the bedside for consultation at this time.  The patient has been physically active and functionally independent for all of his life. He has been retired for more than 20 years, having previously worked in the [employer].  He drives an automobile and cares for himself without any physical limitations. He states that he used to walk regular for exercise, although this has been somewhat limited over the past year since he underwent surgery for a Baker's cyst on the left knee. He admits to a long-standing history of intermittent exertional chest tightness that he describes as "very mild". He has been quite active physically, including as recently as 2 weeks ago he went on a trip with his family to [facility] and felt quite well. During that trip he and his entire family  did a great deal of walking, and the patient experienced no problems whatsoever.  Over the past week he developed progressive symptoms of exertional chest tightness and shortness of  breath. Symptoms would be mildly improved with rest or sublingual nitroglycerin, but the patient states that his tightness never completely goes away. The chest tightness and shortness of breath is made worse by lying flat in bed or on his left side. He has not had palpitations. He has had very mild dizziness without syncope.  He denies any fevers, chills, or productive cough. Appetite is been good. Bowel function is normal.  Past Medical History  Diagnosis Date  . Coronary artery disease     a. 2011: DES to proximal LAD  . HLD (hyperlipidemia)   . Mitral valve prolapse   . Chronic diastolic CHF (congestive heart failure) ([place])   . Acute on chronic diastolic heart failure ([place]) 11/19/2015  . Mitral regurgitation 11/19/2015    Past Surgical History  Procedure Laterality Date  . Coronary stent placement  2011    DES to proximal LAD  . Cardiac catheterization N/A 11/19/2015    Procedure: Left Heart Cath and Coronary Angiography;  Surgeon: Burnell Blanks, MD;  Location: [facility];  Service: Cardiovascular;  Laterality: N/A;    Family History  Problem Relation Age of Onset  . Heart failure Mother   . Stroke Sister     Social History   Social History  . Marital Status: Widowed    Spouse Name: N/A  . Number of Children: N/A  . Years of Education: N/A   Occupational History  . Not on file.   Social History Main Topics  . Smoking status: Never Smoker   . Smokeless tobacco: Never Used  . Alcohol Use: 2.5 - 3.0 oz/week    5-6 Standard drinks or equivalent per week     Comment: wine  . Drug Use: No  . Sexual Activity: Not on file   Other Topics Concern  . Not on file   Social History Narrative    Prior to Admission medications   Medication Sig Start Date End Date Taking? Authorizing Provider  aspirin EC 81 MG tablet Take 81 mg by mouth daily.   Yes Historical Provider, MD  atorvastatin (LIPITOR) 20 MG tablet Take 20 mg by mouth daily.   Yes Historical  Provider, MD  cholecalciferol (VITAMIN D) 1000 UNITS tablet Take 2,000 Units by mouth daily.   Yes Historical Provider, MD  famotidine (PEPCID) 10 MG tablet Take 10 mg by mouth as needed for heartburn or indigestion.   Yes Historical Provider, MD  hyoscyamine (LEVSIN, ANASPAZ) 0.125 MG tablet Take 0.125 mg by mouth every 4 (four) hours as needed for cramping.   Yes Historical Provider, MD  ibuprofen (ADVIL,MOTRIN) 200 MG tablet Take 200 mg by mouth every 6 (six) hours as needed (pain).   Yes Historical Provider, MD  montelukast (SINGULAIR) 10 MG tablet Take 10 mg by mouth at bedtime.   Yes Historical Provider, MD  nitroGLYCERIN (NITROSTAT) 0.4 MG SL tablet Place 0.4 mg under the tongue every 5 (five) minutes as needed for chest pain.   Yes Historical Provider, MD    Current Facility-Administered Medications  Medication Dose Route Frequency Provider Last Rate Last Dose  . 0.9 %  sodium chloride infusion  250 mL Intravenous PRN Burnell Blanks, MD      . acetaminophen (TYLENOL) tablet 650 mg  650 mg Oral Q4H PRN Erma Heritage, [place]  650 mg at 11/19/15 1123  . aspirin EC tablet 81 mg  81 mg Oral Daily Erma Heritage, [place]   81 mg at 11/21/15 0810  . atorvastatin (LIPITOR) tablet 20 mg  20 mg Oral q1800 Erma Heritage, [place]   20 mg at 11/21/15 1742  . famotidine (PEPCID) tablet 10 mg  10 mg Oral BID PRN Erma Heritage, PA      . montelukast (SINGULAIR) tablet 10 mg  10 mg Oral QHS Erma Heritage, [place]   10 mg at 11/21/15 2141  . nitroGLYCERIN (NITROSTAT) SL tablet 0.4 mg  0.4 mg Sublingual Q5 min PRN Erma Heritage, PA      . ondansetron [facility]) injection 4 mg  4 mg Intravenous Q6H PRN Erma Heritage, [place]      . sodium chloride 0.9 % injection 3 mL  3 mL Intravenous Q12H Burnell Blanks, MD   3 mL at 11/21/15 2200  . sodium chloride 0.9 % injection 3 mL  3 mL Intravenous PRN Burnell Blanks, MD        Not on File    Review of  Systems:   General:  normal appetite, normal energy, no weight gain, no weight loss, no fever  Cardiac:  + chest pain with exertion, + chest pain at rest, + SOB with exertion, + resting SOB, NO PND, + orthopnea, no palpitations, no arrhythmia, no atrial fibrillation, no LE edema, + dizzy spells, no syncope  Respiratory:  + shortness of breath, no home oxygen, no productive cough, no dry cough, no bronchitis, no wheezing, no hemoptysis, no asthma, no pain with inspiration or cough, no sleep apnea, no CPAP at night  GI:   no difficulty swallowing, no reflux, no frequent heartburn, no hiatal hernia, no abdominal pain, no constipation, no diarrhea, no hematochezia, no hematemesis, no melena  GU:   no dysuria,  no frequency, no urinary tract infection, no hematuria, no enlarged prostate, no kidney stones, no kidney disease  Vascular:  no pain suggestive of claudication, no pain in feet, no leg cramps, no varicose veins, no DVT, no non-healing foot ulcer  Neuro:   no stroke, no TIA's, no seizures, no headaches, no temporary blindness one eye,  no slurred speech, no peripheral neuropathy, no chronic pain, no instability of gait, no memory/cognitive dysfunction  Musculoskeletal: no arthritis other than left knee, no joint swelling, no myalgias, no difficulty walking, normal mobility   Skin:   no rash, no itching, no skin infections, no pressure sores or ulcerations  Psych:   no anxiety, no depression, no nervousness, no unusual recent stress  Eyes:   no blurry vision, no floaters, no recent vision changes, + wears glasses or contacts  ENT:   no hearing loss, no loose or painful teeth, no dentures, last saw dentist within the past year  Hematologic:  no easy bruising, no abnormal bleeding, no clotting disorder, no frequent epistaxis  Endocrine:  no diabetes, does not check CBG's at home     Physical Exam:   BP 106/64 mmHg  Pulse 96  Temp(Src) 98.6 F (37 C) (Oral)  Resp 16  Ht 5\' 11"  (1.803 m)  Wt  72.258 kg (159 lb 4.8 oz)  BMI 22.23 kg/m2  SpO2 94%  General:  Thin,  well-appearing  HEENT:  Unremarkable   Neck:   no JVD, no bruits, no adenopathy   Chest:   Mild bibasilar inspiratory crackles, symmetrical breath sounds, no wheezes, no rhonchi  CV:   RRR, grade IV/VI holosystolic murmur   Abdomen:  soft, non-tender, no masses   Extremities:  warm, well-perfused, pulses diminished but palpable, no lower extremity edema  Rectal/GU  Deferred  Neuro:   Grossly non-focal and symmetrical throughout  Skin:   Clean and dry, no rashes, no breakdown  Diagnostic Tests:  CARDIAC CATHETERIZATION  Procedures    Left Heart Cath and Coronary Angiography    Conclusion    1. Triple vessel CAD  2. Patent stent mid LAD with minimal restenosis.  3. Moderate stenosis proximal Circumflex that does not appear to be flow limiting.  4. Moderate stenosis mid LAD beyond the old stented segment 5. Mild to Moderate stenosis mid RCA 6. 3+ Mitral regurgitation 7. Normal LV systolic function  Recommendations: His clinical presentation includes acute CHF with pleuritic chest pain and dyspnea. There are no severe flow limiting lesions noted on cath although he does have moderate CAD. He is also noted to have 3+ MR on cath. Echo is pending. At this time, would follow results of echo and continue diuresis with IV Lasix. Medical management of CAD at this time but may need revascularization if mitral valve is repaired/replaced.     Indications    Coronary artery disease involving native coronary artery of native heart with unstable angina pectoris ([place]) [I25.110 (ICD-10-CM)]    Technique and Indications    Estimated blood loss <50 mL. Indication: 81 yo male with history of CAD with DES placement mid LAD in 2011 at which time he also had moderate proximal Circumflex and moderate mid LAD stenosis. Followed by Dr. Wynonia Lawman for mitral valve prolapse. Now presenting with acute CHF, pleuritic chest pain and  dyspnea. Chest x-ray with bilateral pleural effusions. Cardiac cath to exclude progression of CAD.   Procedure: The risks, benefits, complications, treatment options, and expected outcomes were discussed with the patient. The patient and/or family concurred with the proposed plan, giving informed consent. The patient was brought to the cath lab after IV hydration was begun and oral premedication was given. The patient was further sedated with Versed and Fentanyl. The right wrist was assessed with a modified Allens test which was positive. The right wrist was prepped and draped in a sterile fashion. 1% lidocaine was used for local anesthesia. Using the modified Seldinger access technique, a 5 French sheath was placed in the right radial artery. 3 mg Verapamil was given through the sheath. 3500 units IV heparin was given. Standard diagnostic catheters were used to perform selective coronary angiography. A pigtail catheter was used to perform a left ventricular angiogram. The sheath was removed from the right radial artery and a Terumo hemostasis band was applied at the arteriotomy site on the right wrist.  No complications.    Coronary Findings    Dominance: Right   Left Anterior Descending   . Ost LAD to Prox LAD lesion, 40% stenosed. Calcified diffuse.   . Prox LAD to Mid LAD lesion, 10% stenosed. Diffuse. The lesion was previously treated with a drug-eluting stent greater than two years ago.   . Mid LAD lesion, 50% stenosed. Diffuse.   . First Diagonal Branch   . Ost 1st Diag to 1st Diag lesion, 50% stenosed. Discrete.   . Second Diagonal Branch   The vessel is small in size.     Left Circumflex  . Vessel is large.   Colon Flattery Cx to Prox Cx lesion, 65% stenosed. Discrete.   . First Obtuse Marginal Branch   The  vessel is moderate in size.   Marland Kitchen Second Obtuse Marginal Branch   The vessel is moderate in size.     Right Coronary Artery  . Vessel is large.   . Prox RCA lesion, 20% stenosed.  Calcified diffuse.   . Mid RCA lesion, 40% stenosed. Calcified diffuse.   . Right Posterior Descending Artery   The vessel is small in size.      Wall Motion                 Left Heart    Left Ventricle The left ventricular size is normal. The left ventricular systolic function is normal. There are no wall motion abnormalities in the left ventricle.   Mitral Valve There is moderate (3+) mitral regurgitation.    Coronary Diagrams    Diagnostic Diagram            Implants    Name ID Temporary Type Supply   No information to display    PACS Images    Show images for Cardiac catheterization     Link to Procedure Log    Procedure Log      Hemo Data       Most Recent Value   AO Systolic Pressure  85 mmHg   AO Diastolic Pressure  52 mmHg   AO Mean  68 mmHg   LV Systolic Pressure  96 mmHg   LV Diastolic Pressure  4 mmHg   LV EDP  12 mmHg   Arterial Occlusion Pressure Extended Systolic Pressure  96 mmHg   Arterial Occlusion Pressure Extended Diastolic Pressure  52 mmHg   Arterial Occlusion Pressure Extended Mean Pressure  73 mmHg   Left Ventricular Apex Extended Systolic Pressure  97 mmHg   Left Ventricular Apex Extended Diastolic Pressure  0 mmHg   Left Ventricular Apex Extended EDP Pressure  15 mmHg     Transthoracic Echocardiography  Patient:  [place], [place] MR #:    [id number] Study Date: 11/20/2015 Gender:   M Age:    60 Height:   180.3 cm Weight:   71.7 kg BSA:    1.89 m^2 Pt. Status: Room:    3W20C  Jetty Duhamel, M.D. REFERRING  Jenkins Rouge, M.D. ADMITTING  Sherren Mocha, MD ATTENDING  Gareth Morgan PERFORMING  [facility], Inpatient SONOGRAPHER Roseanna Rainbow  cc:  ------------------------------------------------------------------- LV EF: 55% -  60%  ------------------------------------------------------------------- Indications:   424.0 Mitral valve  disease.  ------------------------------------------------------------------- History:  PMH:  Coronary artery disease. Angina pectoris. Congestive heart failure. Risk factors: Hypertension.  ------------------------------------------------------------------- Study Conclusions  - Left ventricle: The cavity size was normal. Wall thickness was normal. Systolic function was normal. The estimated ejection fraction was in the range of 55% to 60%. Doppler parameters are consistent with elevated ventricular end-diastolic filling pressure. - Mitral valve: Bileaflet prolapse with likely flail segment to posterior leaflet and severe MR. Patient will have TEE arranged for Monday and will need surgical consult given presentation with CHF. Valve area by continuity equation (using LVOT flow): 1.52 cm^2. - Left atrium: The atrium was mildly dilated. - Atrial septum: No defect or patent foramen ovale was identified. - Pulmonary arteries: PA peak pressure: 48 mm Hg (S). - Pericardium, extracardiac: There was a left pleural effusion.  Transthoracic echocardiography. M-mode, complete 2D, spectral Doppler, and color Doppler. Birthdate: Patient birthdate: [birth year]. Age: Patient is 81 yr old. Sex: Gender: male. BMI: 22 kg/m^2. Blood pressure:   109/62 Patient status: Inpatient. Study date: Study date:  11/20/2015. Study time: 10:45 AM. Location: Bedside.  -------------------------------------------------------------------  ------------------------------------------------------------------- Left ventricle: The cavity size was normal. Wall thickness was normal. Systolic function was normal. The estimated ejection fraction was in the range of 55% to 60%. Doppler parameters are consistent with elevated ventricular end-diastolic filling pressure.  ------------------------------------------------------------------- Aortic valve:  Structurally normal valve.  Cusp  separation was normal. Doppler: Transvalvular velocity was within the normal range. There was no stenosis. There was no regurgitation.  ------------------------------------------------------------------- Aorta: The aorta was normal, not dilated, and non-diseased.  ------------------------------------------------------------------- Mitral valve: Bileaflet prolapse with likely flail segment to posterior leaflet and severe MR. Patient will have TEE arranged for Monday and will need surgical consult given presentation with CHF. Doppler: Valve area by continuity equation (using LVOT flow): 1.52 cm^2. Indexed valve area by continuity equation (using LVOT flow): 0.8 cm^2/m^2.  Mean gradient (D): 5 mm Hg.  ------------------------------------------------------------------- Left atrium: The atrium was mildly dilated.  ------------------------------------------------------------------- Atrial septum: No defect or patent foramen ovale was identified.  ------------------------------------------------------------------- Right ventricle: The cavity size was normal. Wall thickness was normal. Systolic function was normal.  ------------------------------------------------------------------- Pulmonic valve:  Doppler: There was mild regurgitation.  ------------------------------------------------------------------- Tricuspid valve:  Doppler: There was mild regurgitation.  ------------------------------------------------------------------- Right atrium: The atrium was normal in size.  ------------------------------------------------------------------- Systemic veins: Inferior vena cava: The vessel was dilated. The respirophasic diameter changes were blunted (< 50%), consistent with elevated central venous pressure.  ------------------------------------------------------------------- Pleura: There was a left pleural  effusion.  ------------------------------------------------------------------- Post procedure conclusions Ascending Aorta:  - The aorta was normal, not dilated, and non-diseased.  ------------------------------------------------------------------- Measurements  Left ventricle              Value     Reference LV ID, ED, PLAX chordal          49  mm    43 - 52 LV ID, ES, PLAX chordal          34  mm    23 - 38 LV fx shortening, PLAX chordal      31  %    >=29 LV PW thickness, ED            9   mm    --------- IVS/LV PW ratio, ED            1       <=1.3 Stroke volume, 2D             77  ml    --------- Stroke volume/bsa, 2D           41  ml/m^2  --------- LV ejection fraction, 1-p A4C       61  %    --------- LV end-diastolic volume, 2-p       134  ml    --------- LV end-systolic volume, 2-p        59  ml    --------- LV ejection fraction, 2-p         56  %    --------- Stroke volume, 2-p            75  ml    --------- LV end-diastolic volume/bsa, 2-p     71  ml/m^2  --------- LV end-systolic volume/bsa, 2-p      31  ml/m^2  --------- Stroke volume/bsa, 2-p          39.6 ml/m^2  --------- LV e&', lateral              10.6 cm/s   ---------  LV e&', medial               6.15 cm/s   --------- LV e&', average              8.38 cm/s   ---------  Ventricular septum            Value     Reference IVS thickness, ED             9   mm    ---------  LVOT                   Value     Reference LVOT ID, S                21  mm    --------- LVOT area                 3.46 cm^2   --------- LVOT peak velocity, S            119  cm/s   --------- LVOT mean velocity, S           72  cm/s   --------- LVOT VTI, S                22.2 cm    --------- LVOT peak gradient, S           6   mm Hg  ---------  Aorta                   Value     Reference Aortic root ID, ED            31  mm    ---------  Left atrium                Value     Reference LA ID, A-P, ES              39  mm    --------- LA ID/bsa, A-P              2.06 cm/m^2  <=2.2 LA volume, S               101  ml    --------- LA volume/bsa, S             53.4 ml/m^2  --------- LA volume, ES, 1-p A4C          94  ml    --------- LA volume/bsa, ES, 1-p A4C        49.7 ml/m^2  --------- LA volume, ES, 1-p A2C          92.7 ml    --------- LA volume/bsa, ES, 1-p A2C        49  ml/m^2  ---------  Mitral valve               Value     Reference Mitral mean velocity, D          99.7 cm/s   --------- Mitral deceleration time         194  ms    150 - 230 Mitral mean gradient, D          5   mm Hg  --------- Mitral E/A ratio, peak          2       --------- Mitral valve area, LVOT          1.52 cm^2   --------- continuity  Mitral valve area/bsa, LVOT        0.8  cm^2/m^2 --------- continuity Mitral annulus VTI, D           50.7 cm    --------- Mitral maximal regurg velocity,      435  cm/s   --------- PISA Mitral regurg VTI, PISA          121  cm    --------- Mitral ERO, PISA             0.56 cm^2   --------- Mitral regurg volume, PISA        68  ml    ---------  Pulmonary arteries            Value      Reference PA pressure, S, DP        (H)   48  mm Hg  <=30  Tricuspid valve              Value     Reference Tricuspid regurg peak velocity      289  cm/s   --------- Tricuspid peak RV-RA gradient       33  mm Hg  ---------  Systemic veins              Value     Reference Estimated CVP               15  mm Hg  ---------  Right ventricle              Value     Reference TAPSE                   33  mm    --------- RV pressure, S, DP        (H)   48  mm Hg  <=30 RV s&', lateral, S             14  cm/s   ---------  Legend: (L) and (H) mark values outside specified reference range.  ------------------------------------------------------------------- Prepared and Electronically Authenticated by  Jenkins Rouge, M.D. 2016-11-26T11:46:32    Impression:  Patient has stage D severe symptomatic primary mitral regurgitation and multivessel coronary artery disease. He presents with a relatively acute onset of exacerbation of symptoms of angina pectoris and congestive heart failure. I have personally reviewed the patient's transthoracic echocardiogram and diagnostic cardiac catheterization. Transesophageal echocardiogram remains pending at this time. The patient clearly has myxomatous disease of the mitral valve with what appears to be a flail segment of the posterior leaflet and severe mitral regurgitation. There is moderate multivessel coronary artery disease without any high-grade stenosis. I suspect the patient's relatively acute onset of symptoms likely corresponds to the development of ruptured chordae tendineae with a sudden increase in the severity of mitral regurgitation.    Plan:  Clinically the patient seems to be doing quite well on medical therapy. I discussed the implications of his diagnosis at length with the  patient and his daughter at the bedside. The indications for surgical intervention have been discussed.  We await results of transesophageal echocardiogram that has been scheduled for later today.  Depending upon findings we will discuss surgical options, the likelihood that his valve may be repairable, expectations for his postoperative convalescence, and possibilities for the timing of surgical intervention. All of their questions have been addressed.   I spent in excess of 120 minutes during the conduct of this hospital consultation and >50% of this time involved direct  face-to-face encounter for counseling and/or coordination of the patient's care.    Valentina Gu. Roxy Manns, MD 11/22/2015 9:35 AM

## 2015-11-22 NOTE — H&P (Signed)
     INTERVAL PROCEDURE H&P  History and Physical Interval Note:  11/22/2015 9:48 AM  Jack James has presented today for their planned procedure. The various methods of treatment have been discussed with the patient and family. After consideration of risks, benefits and other options for treatment, the patient has consented to the procedure.  The patients' outpatient history has been reviewed, patient examined, and no change in status from most recent office note within the past 30 days. I have reviewed the patients' chart and labs and will proceed as planned. Questions were answered to the patient's satisfaction.   [name], MD, [facility] Attending Cardiologist [place] [facility] Rhodie Cienfuegos 11/22/2015, 9:48 AM

## 2015-11-22 NOTE — CV Procedure (Signed)
TRANSESOPHAGEAL ECHOCARDIOGRAM (TEE) NOTE  INDICATIONS: myxomatous mitral valve disease, severe mitral regurgitation pre-op  PROCEDURE:   Informed consent was obtained prior to the procedure. The risks, benefits and alternatives for the procedure were discussed and the patient comprehended these risks.  Risks include, but are not limited to, cough, sore throat, vomiting, nausea, somnolence, esophageal and stomach trauma or perforation, bleeding, low blood pressure, aspiration, pneumonia, infection, trauma to the teeth and death.    After a procedural time-out, the patient was given 5 mg versed and 50 mcg fentanyl for moderate sedation.  The oropharynx was anesthetized 10 cc of topical 1% viscous lidocaine and 2 cetacaine sprays.  The transesophageal probe was inserted in the esophagus and stomach without difficulty and multiple views were obtained.  The patient was kept under observation until the patient left the procedure room.  The patient left the procedure room in stable condition.   Agitated microbubble saline contrast was not administered.  COMPLICATIONS:    There were no immediate complications.  Findings:  1. LEFT VENTRICLE: The left ventricular wall thickness is normal.  The left ventricular cavity is normal in size. Wall motion is normal.  LVEF is 60-65%.  2. RIGHT VENTRICLE:  The right ventricle is normal in structure and function without any thrombus or masses.    3. LEFT ATRIUM:  The left atrium is dilated in size without any thrombus or masses.  There is not spontaneous echo contrast ("smoke") in the left atrium consistent with a low flow state.  4. LEFT ATRIAL APPENDAGE:  The left atrial appendage is free of any thrombus or masses. The appendage has single lobes. Pulse doppler indicates moderate flow in the appendage.  5. ATRIAL SEPTUM:  The atrial septum appears intact and is free of thrombus and/or masses.  There is no evidence for interatrial shunting by color  doppler.  6. RIGHT ATRIUM:  The right atrium is normal in size and function without any thrombus or masses.  7. MITRAL VALVE:  Well visualized in 2D and 3D views. The mitral valve is myxomatous with significant thickening of the AMVL. This leaflet is noted to prolapse. There is a flail chord attached to the PMVL (mostly P2), however, the entire leaflet appears flail. There is associated severe MR with a RVol >149mL and a vena contracta of 1.4 cm. There is reversal of flow in the pulmonary veins. Findings consistent with Severe centro-posteriorly directed regurgitation.  There were no vegetations or stenosis.  8. AORTIC VALVE:  The aortic valve is trileaflet, normal in structure and function with no regurgitation.  There were no vegetations or stenosis  9. TRICUSPID VALVE:  The tricuspid valve is normal in structure and function with Mild regurgitation.  There were no vegetations or stenosis  10.  PULMONIC VALVE:  The pulmonic valve is normal in structure and function with trivial regurgitation.  There were no vegetations or stenosis.   11. AORTIC ARCH, ASCENDING AND DESCENDING AORTA:  There was no Ron Parker et. Al, 1992) atherosclerosis of the ascending aorta, aortic arch, or proximal descending aorta.  12. PULMONARY VEINS: Anomalous pulmonary venous return was not noted. Reversal of flow in the LUPV was noted.  13. PERICARDIUM: The pericardium appeared normal and non-thickened.  There is no pericardial effusion.  IMPRESSION:   1. Severe acute MR secondary to flail/ruptured posterior leaflet chord - it appears most if not all of the posterior leaflet prolapses, there is also thickening and mild prolapse of the anterior leaflet 2. No LAA  thrombus 3. No PFO by color doppler 4. LVEF 60-65% with normal wall motion  RECOMMENDATIONS:    1.  Surgical evaluation is pending for possible mitral valve repair versus replacement.  Time Spent Directly with the Patient:  45 minutes   [name],  MD, [facility] Attending Cardiologist [facility]  11/22/2015, 11:39 AM

## 2015-11-22 NOTE — Progress Notes (Signed)
  Echocardiogram Echocardiogram Transesophageal has been performed.  Jack James M 11/22/2015, 11:53 AM

## 2015-11-23 ENCOUNTER — Inpatient Hospital Stay (HOSPITAL_COMMUNITY): Payer: Medicare Other

## 2015-11-23 ENCOUNTER — Encounter (HOSPITAL_COMMUNITY): Payer: Self-pay | Admitting: Internal Medicine

## 2015-11-23 LAB — URINALYSIS, ROUTINE W REFLEX MICROSCOPIC
Bilirubin Urine: NEGATIVE
Glucose, UA: NEGATIVE mg/dL
HGB URINE DIPSTICK: NEGATIVE
Ketones, ur: NEGATIVE mg/dL
LEUKOCYTES UA: NEGATIVE
NITRITE: NEGATIVE
PROTEIN: NEGATIVE mg/dL
SPECIFIC GRAVITY, URINE: 1.022 (ref 1.005–1.030)
pH: 6.5 (ref 5.0–8.0)

## 2015-11-23 LAB — PULMONARY FUNCTION TEST
DL/VA % PRED: 70 %
DL/VA: 3.28 ml/min/mmHg/L
DLCO COR % PRED: 45 %
DLCO COR: 15.31 ml/min/mmHg
DLCO UNC % PRED: 42 %
DLCO UNC: 14.27 ml/min/mmHg
FEF 25-75 Post: 1.1 L/sec
FEF 25-75 Pre: 1.11 L/sec
FEF2575-%Change-Post: -1 %
FEF2575-%PRED-POST: 51 %
FEF2575-%PRED-PRE: 52 %
FEV1-%CHANGE-POST: 0 %
FEV1-%Pred-Post: 72 %
FEV1-%Pred-Pre: 71 %
FEV1-POST: 2.2 L
FEV1-Pre: 2.18 L
FEV1FVC-%CHANGE-POST: 1 %
FEV1FVC-%Pred-Pre: 94 %
FEV6-%Change-Post: -2 %
FEV6-%PRED-PRE: 77 %
FEV6-%Pred-Post: 75 %
FEV6-PRE: 3.07 L
FEV6-Post: 3 L
FEV6FVC-%Change-Post: -1 %
FEV6FVC-%PRED-PRE: 101 %
FEV6FVC-%Pred-Post: 99 %
FVC-%CHANGE-POST: 0 %
FVC-%PRED-POST: 75 %
FVC-%PRED-PRE: 76 %
FVC-PRE: 3.23 L
FVC-Post: 3.21 L
POST FEV1/FVC RATIO: 68 %
POST FEV6/FVC RATIO: 93 %
PRE FEV6/FVC RATIO: 95 %
Pre FEV1/FVC ratio: 68 %
RV % PRED: 64 %
RV: 1.73 L
TLC % pred: 67 %
TLC: 4.9 L

## 2015-11-23 LAB — BLOOD GAS, ARTERIAL
ACID-BASE EXCESS: 0.9 mmol/L (ref 0.0–2.0)
Bicarbonate: 23.9 mEq/L (ref 20.0–24.0)
DRAWN BY: 44166
O2 Saturation: 86.7 %
PATIENT TEMPERATURE: 98.6
PCO2 ART: 31.6 mmHg — AB (ref 35.0–45.0)
PH ART: 7.491 — AB (ref 7.350–7.450)
TCO2: 24.9 mmol/L (ref 0–100)
pO2, Arterial: 50.8 mmHg — ABNORMAL LOW (ref 80.0–100.0)

## 2015-11-23 LAB — COMPREHENSIVE METABOLIC PANEL
ALT: 17 U/L (ref 17–63)
AST: 21 U/L (ref 15–41)
Albumin: 2.7 g/dL — ABNORMAL LOW (ref 3.5–5.0)
Alkaline Phosphatase: 50 U/L (ref 38–126)
Anion gap: 6 (ref 5–15)
BUN: 17 mg/dL (ref 6–20)
CHLORIDE: 109 mmol/L (ref 101–111)
CO2: 24 mmol/L (ref 22–32)
CREATININE: 1.07 mg/dL (ref 0.61–1.24)
Calcium: 8.4 mg/dL — ABNORMAL LOW (ref 8.9–10.3)
GFR calc non Af Amer: >60 mL/min (ref >60)
Glucose, Bld: 102 mg/dL — ABNORMAL HIGH (ref 65–99)
POTASSIUM: 4.1 mmol/L (ref 3.5–5.1)
SODIUM: 139 mmol/L (ref 135–145)
Total Bilirubin: 0.8 mg/dL (ref 0.3–1.2)
Total Protein: 5.1 g/dL — ABNORMAL LOW (ref 6.5–8.1)

## 2015-11-23 LAB — CBC
HCT: 38.5 % — ABNORMAL LOW (ref 39.0–52.0)
HEMOGLOBIN: 12.4 g/dL — AB (ref 13.0–17.0)
MCH: 31.2 pg (ref 26.0–34.0)
MCHC: 32.2 g/dL (ref 30.0–36.0)
MCV: 96.7 fL (ref 78.0–100.0)
Platelets: 226 10*3/uL (ref 150–400)
RBC: 3.98 MIL/uL — AB (ref 4.22–5.81)
RDW: 13 % (ref 11.5–15.5)
WBC: 8.8 10*3/uL (ref 4.0–10.5)

## 2015-11-23 LAB — PREALBUMIN: Prealbumin: 10 mg/dL — ABNORMAL LOW (ref 18–38)

## 2015-11-23 LAB — TSH: TSH: 1.078 u[IU]/mL (ref 0.350–4.500)

## 2015-11-23 MED ORDER — FUROSEMIDE 20 MG PO TABS
20.0000 mg | ORAL_TABLET | Freq: Two times a day (BID) | ORAL | Status: DC
  Administered 2015-11-23 – 2015-11-24 (×2): 20 mg via ORAL
  Filled 2015-11-23 (×2): qty 1

## 2015-11-23 MED ORDER — ALBUTEROL SULFATE (2.5 MG/3ML) 0.083% IN NEBU
2.5000 mg | INHALATION_SOLUTION | Freq: Once | RESPIRATORY_TRACT | Status: AC
  Administered 2015-11-23: 2.5 mg via RESPIRATORY_TRACT

## 2015-11-23 MED ORDER — AMIODARONE HCL 200 MG PO TABS
200.0000 mg | ORAL_TABLET | Freq: Two times a day (BID) | ORAL | Status: DC
  Administered 2015-11-23 – 2015-11-24 (×3): 200 mg via ORAL
  Filled 2015-11-23 (×3): qty 1

## 2015-11-23 MED ORDER — FUROSEMIDE 10 MG/ML IJ SOLN
40.0000 mg | Freq: Once | INTRAMUSCULAR | Status: AC
  Administered 2015-11-23: 40 mg via INTRAVENOUS
  Filled 2015-11-23: qty 4

## 2015-11-23 NOTE — Progress Notes (Signed)
CARDIAC REHAB PHASE I   PRE:  Rate/Rhythm: 95 SR  BP:  Supine: 116/52  Sitting:   Standing:    SaO2: 97%RA  MODE:  Ambulation: 550 ft   POST:  Rate/Rhythm: 116 ST  BP:  Supine: 130/60  Sitting:   Standing:    SaO2: 95%RA 1104-1144 Pt walked 550 ft on RA with steady gait. No CP but slightly SOB. Pre op ed completed with pt and daughter who voiced understanding. Pt had IS and can get to [phone] now. Stated he got to 1750 ml earlier. Gave [facility] booklet and care guide. Wrote down how to view pre op video as they did not want to watch right now. Daughter stated pt will be staying with her for about month after surgery. Asked about Rehab after surgery and pt is interested in  CRP 2 in [facility] at [facility]. Will refer after surgery. Discussed importance of mobility and IS after surgery. Discussed sternal precautions and how to get up and down without pressure on arms.   Graylon Good, RN BSN  11/23/2015 11:39 AM

## 2015-11-23 NOTE — Care Management Important Message (Signed)
Important Message  Patient Details  Name: Jack James MRN: [id number] Date of Birth: December 10, 1934   Medicare Important Message Given:  Yes    Nathen May 11/23/2015, 12:13 PM

## 2015-11-23 NOTE — Progress Notes (Signed)
[address]       [name] [address]             [phone]     CARDIOTHORACIC SURGERY PROGRESS NOTE  1 Day Post-Op  S/P Procedure(s) (LRB): TRANSESOPHAGEAL ECHOCARDIOGRAM (TEE) (N/A)  Subjective: Patient reports feeling well.  Some dyspnea with ambulation but overall feels better  Objective: Vital signs in last 24 hours: Temp:  [97.9 F (36.6 C)-99.1 F (37.3 C)] 98.9 F (37.2 C) (11/29 1430) Pulse Rate:  [94-96] 94 (11/29 1430) Cardiac Rhythm:  [-] Normal sinus rhythm (11/29 1041) Resp:  [17-18] 17 (11/29 1430) BP: (102-121)/(52-59) 113/59 mmHg (11/29 1430) SpO2:  [94 %-96 %] 94 % (11/29 1430) Weight:  [72.576 kg (160 lb)] 72.576 kg (160 lb) (11/29 0500)  Physical Exam:  Rhythm:   sinus  Breath sounds: Few bibasilar crackles R>L  Heart sounds:  RRR w/ holosystolic murmur  Incisions:  n/a  Abdomen:  soft  Extremities:  Warm, no edema   Intake/Output from previous day: 11/28 0701 - 11/29 0700 In: 1415 [P.O.:962; I.V.:453] Out: 1050 [Urine:1050] Intake/Output this shift: Total I/O In: 720 [P.O.:720] Out: 1650 [Urine:1650]  Lab Results:  Recent Labs  11/22/15 0546 11/23/15 0420  WBC 9.2 8.8  HGB 13.0 12.4*  HCT 39.3 38.5*  PLT 200 226   BMET:  Recent Labs  11/23/15 0420  NA 139  K 4.1  CL 109  CO2 24  GLUCOSE 102*  BUN 17  CREATININE 1.07  CALCIUM 8.4*    CBG (last 3)  No results for input(s): GLUCAP in the last 72 hours. PT/INR:  No results for input(s): LABPROT, INR in the last 72 hours.  CXR:  CHEST 2 VIEW  COMPARISON: November 19, 2015  FINDINGS: Underlying COPD is again noted. There are pleural effusions and interstitial edema. There is pulmonary venous hypertension. Heart is upper normal in size. No adenopathy appreciable. No bone lesions.  IMPRESSION: Evidence of congestive heart failure superimposed on COPD. Appearance is stable compared to recent prior study.   Electronically Signed  By:  Lowella Grip III M.D.  On: 11/23/2015 08:09    Transesophageal Echocardiography  Patient:  Jack James, Jack James MR #:    [id number] Study Date: 11/22/2015 Gender:   M Age:    81 Height:   177.8 cm Weight:   72.3 kg BSA:    1.89 m^2 Pt. Status: Room:    3W20C  Jetty Duhamel, M.D. REFERRING  Jenkins Rouge, M.D. ADMITTING  Sherren Mocha, MD PERFORMING  Lyman Bishop MD ATTENDING  Jack James SONOGRAPHER Darlina Sicilian, RDCS  cc:  ------------------------------------------------------------------- LV EF: 60% -  65%  ------------------------------------------------------------------- Indications:   Mitral regurgitation 424.0.  ------------------------------------------------------------------- History:  PMH: Mitral Valve Prolapse. EKG Change. Coronary artery disease. PMH: Chronic Anemia. Risk factors: Dyslipidemia.  ------------------------------------------------------------------- Study Conclusions  - Left ventricle: The cavity size was normal. There was mild concentric hypertrophy. Systolic function was normal. The estimated ejection fraction was in the range of 60% to 65%. Wall motion was normal; there were no regional wall motion abnormalities. - Aortic valve: No evidence of vegetation. - Mitral valve: Myxomatous degeneration of the valve with thickening of the AMVL and mild prolapse. There is a flail posterior chord and P2 (pan leaflet prolapse) with severe centro-posteriorly directed mitral regurgitation. - Left atrium: The atrium was dilated. No evidence of thrombus in the atrial cavity or appendage. - Right atrium: The atrium was dilated. - Atrial septum: No  defect or patent foramen ovale was identified. - Pulmonic valve: No evidence of vegetation.  Impressions:  - Myxomatous degeneration of the mitral valve with AMVL thickening and a flail PMVL chord with pan-leaflet  prolpase and severe centro-posteriorly directed regurgitation.  Diagnostic transesophageal echocardiography. 2D and color Doppler. Birthdate: Patient birthdate: [birth year]. Age: Patient is 81 yr old. Sex: Gender: male.  BMI: 22.9 kg/m^2. Blood pressure: 121/62 Patient status: Inpatient. Study date: Study date: 11/22/2015. Study time: 10:48 AM. Location: Endoscopy.  -------------------------------------------------------------------  ------------------------------------------------------------------- Left ventricle: The cavity size was normal. There was mild concentric hypertrophy. Systolic function was normal. The estimated ejection fraction was in the range of 60% to 65%. Wall motion was normal; there were no regional wall motion abnormalities.  ------------------------------------------------------------------- Aortic valve:  Structurally normal valve. Trileaflet. Cusp separation was normal. No evidence of vegetation. Doppler: There was no regurgitation.  ------------------------------------------------------------------- Aorta: The aorta was normal, not dilated, and non-diseased.  ------------------------------------------------------------------- Mitral valve: Myxomatous degeneration of the valve with thickening of the AMVL and mild prolapse. There is a flail posterior chord and P2 (pan leaflet prolapse) with severe centro-posteriorly directed mitral regurgitation.  ------------------------------------------------------------------- Left atrium: The atrium was dilated. No evidence of thrombus in the atrial cavity or appendage.  ------------------------------------------------------------------- Atrial septum: No defect or patent foramen ovale was identified.  ------------------------------------------------------------------- Right ventricle: The cavity size was normal. Wall thickness was normal. Systolic function was  normal.  ------------------------------------------------------------------- Pulmonic valve:  Structurally normal valve.  Cusp separation was normal. No evidence of vegetation.  ------------------------------------------------------------------- Tricuspid valve:  Doppler: There was mild regurgitation.  ------------------------------------------------------------------- Right atrium: The atrium was dilated.  ------------------------------------------------------------------- Pericardium: There was no pericardial effusion.  ------------------------------------------------------------------- Post procedure conclusions Ascending Aorta:  - The aorta was normal, not dilated, and non-diseased.  ------------------------------------------------------------------- Measurements  Mitral valve          Value Mitral regurg VTI, PISA     130  cm Mitral ERO, PISA        0.88 cm^2 Mitral regurg volume, PISA   114  ml  Legend: (L) and (H) mark values outside specified reference range.  ------------------------------------------------------------------- Prepared and Electronically Authenticated by  Lyman Bishop MD 2016-11-28T17:33:44   Assessment/Plan:  Patient has stage D severe symptomatic primary mitral regurgitation and multivessel coronary artery disease. He presents with a relatively acute onset of exacerbation of symptoms of angina pectoris and congestive heart failure. I have personally reviewed the patient's transthoracic echocardiogram, diagnostic cardiac catheterization, and transesophageal echocardiogram.  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  LV systolic function remains normal.  He has moderate multivessel coronary artery disease without any high-grade stenosis. I suspect the patient's relatively  acute onset of symptoms likely corresponds to the development of ruptured chordae tendineae with a sudden increase in the severity of mitral regurgitation.  The patient and his daughter were counseled at length regarding the indications, risks and potential benefits of mitral valve repair.  The rationale for elective surgery has been explained, including a comparison between surgery and continued medical therapy with close follow-up.  The likelihood of successful and durable valve repair has been discussed with particular reference to the findings of their recent echocardiogram.  Based upon these findings and previous experience, I have quoted them a greater than 80 percent likelihood of successful valve repair.  In the unlikely event that their valve cannot be successfully repaired, we discussed the possibility of replacing the  mitral valve using a mechanical prosthesis with the attendant need for long-term anticoagulation versus the alternative of replacing it using a bioprosthetic tissue valve with its potential for late structural valve deterioration and failure, depending upon the patient's longevity.  The patient specifically requests that if the mitral valve must be replaced that it be done using a bioprosthetic tissue valve.   The rationale for concomitant coronary artery bypass grafting has been discussed.  The patient understands and accepts all potential risks of surgery including but not limited to risk of death, stroke or other neurologic complication, myocardial infarction, congestive heart failure, respiratory failure, renal failure, bleeding requiring transfusion and/or reexploration, arrhythmia, infection or other wound complications, pneumonia, pleural and/or pericardial effusion, pulmonary embolus, aortic dissection or other major vascular complication, or delayed complications related to valve repair or replacement including but not limited to structural valve deterioration and failure,  thrombosis, embolization, endocarditis, paravalvular leak, or late recurrence of symptomatic ischemic heart disease.  Expectations for the patient's postoperative convalescence have been discussed.  All of their questions have been answered.  We tentatively plan for surgery on Tuesday November 30, 2015.  If the patient is felt to be stable enough for discharge home during the interim period of time we will make arrangements for him to come to Short Stay and have repeat CBC, BMET, type and screen and CXR on Monday November 29, 2015.   I spent in excess of 30 minutes during the conduct of this hospital encounter and >50% of this time involved direct face-to-face encounter with the patient for counseling and/or coordination of their care.   Rexene Alberts, MD 11/23/2015 5:31 PM

## 2015-11-23 NOTE — Progress Notes (Signed)
UR Completed Jenavie Stanczak Graves-Bigelow, RN,BSN [phone]  

## 2015-11-23 NOTE — Progress Notes (Signed)
TCTS BRIEF PROGRESS NOTE   Stopped by to see patient and discuss results of TEE.  He was off floor having tests performed.  Will follow up later today  Rexene Alberts, MD 11/23/2015 8:52 AM

## 2015-11-23 NOTE — Progress Notes (Signed)
Subjective:  I was notified of the patient's admission this morning by Dr. Burt Knack.  The patient admitted last week with progressive shortness of breath and had severe mitral regurgitation that had progressed since I last saw him.  TEE done yesterday shows a ruptured cord with a partially flail leaflet.  He wants to go home prior to surgery.  He is able to walk in the hall but becomes dyspneic when he comes to the nursing station.  Has not had furosemide since admission.   Objective:  Vital Signs in the last 24 hours: BP 102/52 mmHg  Pulse 95  Temp(Src) 99.1 F (37.3 C) (Oral)  Resp 18  Ht 5\' 11"  (1.803 m)  Wt 72.576 kg (160 lb)  BMI 22.33 kg/m2  SpO2 94%  Physical Exam: Pleasant male in no acute distress  Lungs: Minimal crackles at base  Cardiac:  Regular rhythm, normal S1 and S2, no S3, Holosystolic 3 to 4/6 murmur at apex radiating across the precordium  Abdomen:  Soft, nontender, no masses Extremities:  No edema present  Intake/Output from previous day: 11/28 0701 - 11/29 0700 In: 1415 [P.O.:962; I.V.:453] Out: 1050 [Urine:1050] Weight Filed Weights   11/21/15 0500 11/22/15 0458 11/23/15 0500  Weight: 71.94 kg (158 lb 9.6 oz) 72.258 kg (159 lb 4.8 oz) 72.576 kg (160 lb)    Lab Results: Basic Metabolic Panel:  Recent Labs  11/23/15 0420  NA 139  K 4.1  CL 109  CO2 24  GLUCOSE 102*  BUN 17  CREATININE 1.07    CBC:  Recent Labs  11/22/15 0546 11/23/15 0420  WBC 9.2 8.8  HGB 13.0 12.4*  HCT 39.3 38.5*  MCV 96.3 96.7  PLT 200 226    BNP    Component Value Date/Time   BNP 188.4* 11/19/2015 0832   Telemetry: Sinus tachycardia  Assessment/Plan:  1.  Severe mitral regurgitation 2.  Coronary artery disease moderate with patent stents 3.  Venous insufficiency  Recommendations:  Discussed with Dr. Burt Knack, all data reviewed and also discussed with Dr. Roxy Manns.  His pulmonary functions are adequate but his chest x-ray shows residual effusions.  I spoke  with the patient and daughter and they would like to go home prior to surgery.  After speaking with Dr. Roxy Manns will initiate amiodarone 200 mg twice daily for prophylaxis for atrial fibrillation prior to mitral valve surgery.  I will give a dose of intravenous Lasix today and likely will need to go home on a small dose of furosemide.  Will reevaluate in the morning for stability for discharge tomorrow.  Dr. Roxy Manns will see later on this afternoon.      Kerry Hough  MD [facility]  11/23/2015, 1:34 PM

## 2015-11-23 NOTE — Progress Notes (Signed)
ABG collected  

## 2015-11-24 ENCOUNTER — Other Ambulatory Visit: Payer: Self-pay | Admitting: *Deleted

## 2015-11-24 ENCOUNTER — Inpatient Hospital Stay (HOSPITAL_COMMUNITY): Payer: Medicare Other

## 2015-11-24 DIAGNOSIS — I340 Nonrheumatic mitral (valve) insufficiency: Secondary | ICD-10-CM

## 2015-11-24 DIAGNOSIS — I2510 Atherosclerotic heart disease of native coronary artery without angina pectoris: Secondary | ICD-10-CM

## 2015-11-24 LAB — BASIC METABOLIC PANEL
ANION GAP: 7 (ref 5–15)
BUN: 19 mg/dL (ref 6–20)
CALCIUM: 8.4 mg/dL — AB (ref 8.9–10.3)
CO2: 25 mmol/L (ref 22–32)
Chloride: 106 mmol/L (ref 101–111)
Creatinine, Ser: 1.09 mg/dL (ref 0.61–1.24)
GFR calc Af Amer: >60 mL/min (ref >60)
Glucose, Bld: 97 mg/dL (ref 65–99)
POTASSIUM: 3.9 mmol/L (ref 3.5–5.1)
SODIUM: 138 mmol/L (ref 135–145)

## 2015-11-24 LAB — CBC
HEMATOCRIT: 39.4 % (ref 39.0–52.0)
HEMOGLOBIN: 12.4 g/dL — AB (ref 13.0–17.0)
MCH: 30.6 pg (ref 26.0–34.0)
MCHC: 31.5 g/dL (ref 30.0–36.0)
MCV: 97.3 fL (ref 78.0–100.0)
PLATELETS: 230 10*3/uL (ref 150–400)
RBC: 4.05 MIL/uL — ABNORMAL LOW (ref 4.22–5.81)
RDW: 13.1 % (ref 11.5–15.5)
WBC: 6.7 10*3/uL (ref 4.0–10.5)

## 2015-11-24 LAB — HEMOGLOBIN A1C
Hgb A1c MFr Bld: 5.8 % — ABNORMAL HIGH (ref 4.8–5.6)
MEAN PLASMA GLUCOSE: 120 mg/dL

## 2015-11-24 MED ORDER — POTASSIUM CHLORIDE 2 MEQ/ML IV SOLN
80.0000 meq | INTRAVENOUS | Status: DC
  Filled 2015-11-24: qty 40

## 2015-11-24 MED ORDER — CEFUROXIME SODIUM 750 MG IJ SOLR
750.0000 mg | INTRAMUSCULAR | Status: DC
  Filled 2015-11-24: qty 750

## 2015-11-24 MED ORDER — SODIUM CHLORIDE 0.9 % IV SOLN
INTRAVENOUS | Status: DC
  Filled 2015-11-24: qty 30

## 2015-11-24 MED ORDER — DEXTROSE 5 % IV SOLN
1.5000 g | INTRAVENOUS | Status: AC
  Administered 2015-11-25: 1.5 g via INTRAVENOUS
  Administered 2015-11-25: .75 g via INTRAVENOUS
  Filled 2015-11-24: qty 1.5

## 2015-11-24 MED ORDER — PLASMA-LYTE 148 IV SOLN
INTRAVENOUS | Status: AC
  Administered 2015-11-25: 09:00:00
  Filled 2015-11-24: qty 2.5

## 2015-11-24 MED ORDER — METOPROLOL TARTRATE 12.5 MG HALF TABLET
12.5000 mg | ORAL_TABLET | Freq: Once | ORAL | Status: AC
  Administered 2015-11-25: 12.5 mg via ORAL

## 2015-11-24 MED ORDER — CHLORHEXIDINE GLUCONATE 4 % EX LIQD: 30.0000 mL | CUTANEOUS | Status: DC

## 2015-11-24 MED ORDER — DEXMEDETOMIDINE HCL IN NACL 400 MCG/100ML IV SOLN
0.1000 ug/kg/h | INTRAVENOUS | Status: AC
  Administered 2015-11-25: .2 ug/kg/h via INTRAVENOUS
  Filled 2015-11-24: qty 100

## 2015-11-24 MED ORDER — FUROSEMIDE 20 MG PO TABS: 20.0000 mg | ORAL_TABLET | Freq: Every day | ORAL | Status: DC

## 2015-11-24 MED ORDER — NITROGLYCERIN IN D5W 200-5 MCG/ML-% IV SOLN
2.0000 ug/min | INTRAVENOUS | Status: DC
  Filled 2015-11-24: qty 250

## 2015-11-24 MED ORDER — MAGNESIUM SULFATE 50 % IJ SOLN
40.0000 meq | INTRAMUSCULAR | Status: DC
  Filled 2015-11-24: qty 10

## 2015-11-24 MED ORDER — EPINEPHRINE HCL 1 MG/ML IJ SOLN
0.0000 ug/min | INTRAVENOUS | Status: DC
  Filled 2015-11-24: qty 4

## 2015-11-24 MED ORDER — SODIUM CHLORIDE 0.9 % IV SOLN
1250.0000 mg | INTRAVENOUS | Status: AC
  Administered 2015-11-25: 1250 mg via INTRAVENOUS
  Filled 2015-11-24: qty 1250

## 2015-11-24 MED ORDER — PHENYLEPHRINE HCL 10 MG/ML IJ SOLN
30.0000 ug/min | INTRAVENOUS | Status: AC
  Administered 2015-11-25: 50 ug/min via INTRAVENOUS
  Filled 2015-11-24: qty 2

## 2015-11-24 MED ORDER — SODIUM CHLORIDE 0.9 % IV SOLN
INTRAVENOUS | Status: AC
  Administered 2015-11-25: 69.8 mL/h via INTRAVENOUS
  Administered 2015-11-25: 14 mL/h via INTRAVENOUS
  Filled 2015-11-24: qty 40

## 2015-11-24 MED ORDER — SODIUM CHLORIDE 0.9 % IV SOLN
INTRAVENOUS | Status: AC
  Administered 2015-11-25: .8 [IU]/h via INTRAVENOUS
  Filled 2015-11-24: qty 2.5

## 2015-11-24 MED ORDER — VANCOMYCIN HCL 1000 MG IV SOLR
INTRAVENOUS | Status: DC
  Filled 2015-11-24: qty 1000

## 2015-11-24 MED ORDER — AMIODARONE HCL 200 MG PO TABS: 200.0000 mg | ORAL_TABLET | Freq: Two times a day (BID) | ORAL | Status: DC

## 2015-11-24 MED ORDER — DOPAMINE-DEXTROSE 3.2-5 MG/ML-% IV SOLN
0.0000 ug/kg/min | INTRAVENOUS | Status: AC
  Administered 2015-11-25: 3 ug/kg/min via INTRAVENOUS
  Filled 2015-11-24: qty 250

## 2015-11-24 MED ORDER — CHLORHEXIDINE GLUCONATE 0.12 % MT SOLN
15.0000 mL | Freq: Once | OROMUCOSAL | Status: DC
  Filled 2015-11-24: qty 15

## 2015-11-24 NOTE — Progress Notes (Signed)
Pre-op Cardiac Surgery  Carotid Findings:  Findings suggest 1-39% internal carotid artery stenosis. Vertebral arteries are patent with antegrade flow.   Upper Extremity Right Left  Brachial Pressures 125-Triphasic 116-Triphasic  Radial Waveforms Triphasic Triphasic  Ulnar Waveforms Triphasic Triphasic  Palmar Arch (Allen's Test) Signal obliterates with both radial and ulnar compression. Signal decreases 50% with      Lower  Extremity Right Left  Dorsalis Pedis 140-Triphasic 142-Triphasic  Anterior Tibial    Posterior Tibial 145-Triphasic 145-Triphasic  Ankle/Brachial Indices 1.16 1.16    Findings:   Bilateral ABIs are within normal limits.  11/24/2015 10:27 AM Maudry Mayhew, RVT, RDCS, RDMS

## 2015-11-24 NOTE — H&P (Signed)
[address]       [place] [address]             [phone]          CARDIOTHORACIC SURGERY HISTORY AND PHYSICAL EXAM  PCP is Marylene Land, MD Referring Provider is Josue Hector, MD Primary Cardiologist is Jacolyn Reedy, MD   Reason for consultation: Severe mitral regurgitation and coronary artery disease  HPI:  Patient is a 81 year old male with history of mitral valve prolapse, coronary artery disease, and chronic diastolic congestive heart failure who was admitted to the hospital 11/19/2015 with symptoms consistent with unstable angina and acute exacerbation of chronic diastolic congestive heart failure and has been referred for surgical consultation to discuss treatment options for management of mitral regurgitation and coronary artery disease. The patient states that he has been told that he had a heart murmur for many years, as long as he can remember. In 2011 the patient was noted to report occasional symptoms of substernal chest tightness with exertion and his primary care physician noted a change in the character of his murmur and he was referred for cardiology consultation. A stress test was performed demonstrating findings consistent with anterior wall ischemia, and he subsequently underwent diagnostic cardiac catheterization followed by PCI and stenting of the proximal left anterior descending coronary artery using a drug-eluting stent. He has done well clinically since that time and has been followed intermittently by Dr. Wynonia Lawman. Early last week the patient began to experience worsening symptoms of exertional chest tightness and shortness of breath. Symptoms progressed over several days, prompting presentation to the hospital on 11/19/2015. Serial troponin levels remained minimally elevated to a peak troponin I level of 0.06. BNP level was mildly elevated to 188 on admission and Chest x-ray revealed interstitial pulmonary edema and moderate  effusions consistent with acute exacerbation of congestive heart failure superimposed on COPD. The patient underwent left heart catheterization by Dr. Angelena Form revealing moderate multivessel coronary artery disease including patent stent in the proximal left anterior descending coronary artery with 50% stenosis of the mid left anterior descending coronary artery beyond the distal end of the stent, 65% proximal stenosis of a large left circumflex coronary artery, and 40-50% stenosis of the mid right coronary artery. Right heart catheterization was not performed. Transthoracic echocardiogram performed 11/20/2015 revealed bileaflet prolapse of the mitral valve with a likely flail segment of the posterior leaflet and severe mitral regurgitation. The patient was referred for surgical consultation and transesophageal echocardiogram was performed, confirming the presence of multiple ruptured chordae tendinae with severe mitral regurgitation.  The patient was seen in consultation and plans made for elective mitral valve repair and coronary artery bypass grafting.  The patient was started on amiodarone for prophylaxis of atrial fibrillation.  The patient was discharged from the hospital with plans to return for surgery within the next week.  The patient is a widower and lives alone in [place], [place]. He has a daughter who lives in [place] and is at the bedside for consultation at this time. The patient has been physically active and functionally independent for all of his life. He has been retired for more than 20 years, having previously worked in the [employer]. He drives an automobile and cares for himself without any physical limitations. He states that he used to walk regular for exercise, although this has been somewhat limited over the past year since he underwent surgery for a Baker's cyst on the  left knee. He admits to a long-standing history of intermittent exertional chest tightness that he describes  as "very mild". He has been quite active physically, including as recently as 2 weeks ago he went on a trip with his family to [facility] and felt quite well. During that trip he and his entire family did a great deal of walking, and the patient experienced no problems whatsoever. Over the past week he developed progressive symptoms of exertional chest tightness and shortness of breath. Symptoms would be mildly improved with rest or sublingual nitroglycerin, but the patient states that his tightness never completely goes away. The chest tightness and shortness of breath is made worse by lying flat in bed or on his left side. He has not had palpitations. He has had very mild dizziness without syncope. He denies any fevers, chills, or productive cough. Appetite is been good. Bowel function is normal.   Past Medical History  Diagnosis Date  . Coronary artery disease     a. 2011: DES to proximal LAD  . HLD (hyperlipidemia)   . Mitral valve prolapse   . Chronic diastolic CHF (congestive heart failure) ([place])   . Acute on chronic diastolic heart failure ([place]) 11/19/2015  . Mitral regurgitation 11/19/2015    Past Surgical History  Procedure Laterality Date  . Coronary stent placement  2011    DES to proximal LAD  . Cardiac catheterization N/A 11/19/2015    Procedure: Left Heart Cath and Coronary Angiography;  Surgeon: Burnell Blanks, MD;  Location: [facility];  Service: Cardiovascular;  Laterality: N/A;  . Tee without cardioversion N/A 11/22/2015    Procedure: TRANSESOPHAGEAL ECHOCARDIOGRAM (TEE);  Surgeon: [name], MD;  Location: [facility];  Service: Cardiovascular;  Laterality: N/A;    Family History  Problem Relation Age of Onset  . Heart failure Mother   . Stroke Sister     Social History Social History  Substance Use Topics  . Smoking status: Never Smoker   . Smokeless tobacco: Never Used  . Alcohol Use: 2.5 - 3.0 oz/week    5-6 Standard  drinks or equivalent per week     Comment: wine    Prior to Admission medications   Medication Sig Start Date End Date Taking? Authorizing Provider  amiodarone (PACERONE) 200 MG tablet Take 1 tablet (200 mg total) by mouth 2 (two) times daily. 11/24/15   Jacolyn Reedy, MD  aspirin EC 81 MG tablet Take 81 mg by mouth daily.    Historical Provider, MD  atorvastatin (LIPITOR) 20 MG tablet Take 20 mg by mouth daily.    Historical Provider, MD  cholecalciferol (VITAMIN D) 1000 UNITS tablet Take 2,000 Units by mouth daily.    Historical Provider, MD  famotidine (PEPCID) 10 MG tablet Take 10 mg by mouth as needed for heartburn or indigestion.    Historical Provider, MD  furosemide (LASIX) 20 MG tablet Take 1 tablet (20 mg total) by mouth daily. 11/24/15   Jacolyn Reedy, MD  hyoscyamine (LEVSIN, ANASPAZ) 0.125 MG tablet Take 0.125 mg by mouth every 4 (four) hours as needed for cramping.    Historical Provider, MD  montelukast (SINGULAIR) 10 MG tablet Take 10 mg by mouth at bedtime.    Historical Provider, MD  nitroGLYCERIN (NITROSTAT) 0.4 MG SL tablet Place 0.4 mg under the tongue every 5 (five) minutes as needed for chest pain.    Historical Provider, MD    Not on File    Review  of Systems:  General:normal appetite, normal energy, no weight gain, no weight loss, no fever Cardiac:+ chest pain with exertion, + chest pain at rest, + SOB with exertion, + resting SOB, NO PND, + orthopnea, no palpitations, no arrhythmia, no atrial fibrillation, no LE edema, + dizzy spells, no syncope Respiratory:+ shortness of breath, no home oxygen, no productive cough, no dry cough, no bronchitis, no wheezing, no hemoptysis, no asthma, no pain with inspiration or cough, no sleep apnea, no CPAP at night GI:no difficulty swallowing, no reflux, no frequent heartburn, no hiatal  hernia, no abdominal pain, no constipation, no diarrhea, no hematochezia, no hematemesis, no melena GU:no dysuria, no frequency, no urinary tract infection, no hematuria, no enlarged prostate, no kidney stones, no kidney disease Vascular:no pain suggestive of claudication, no pain in feet, no leg cramps, no varicose veins, no DVT, no non-healing foot ulcer Neuro:no stroke, no TIA's, no seizures, no headaches, no temporary blindness one eye, no slurred speech, no peripheral neuropathy, no chronic pain, no instability of gait, no memory/cognitive dysfunction Musculoskeletal:no arthritis other than left knee, no joint swelling, no myalgias, no difficulty walking, normal mobility  Skin:no rash, no itching, no skin infections, no pressure sores or ulcerations Psych:no anxiety, no depression, no nervousness, no unusual recent stress Eyes:no blurry vision, no floaters, no recent vision changes, + wears glasses or contacts ENT:no hearing loss, no loose or painful teeth, no dentures, last saw dentist within the past year Hematologic:no easy bruising, no abnormal bleeding, no clotting disorder, no frequent epistaxis Endocrine:no diabetes, does not check CBG's at home   Physical Exam:  BP 106/64 mmHg  Pulse 96  Temp(Src) 98.6 F (37 C) (Oral)  Resp 16  Ht 5\' 11"  (1.803 m)  Wt 72.258 kg (159 lb 4.8 oz)  BMI 22.23 kg/m2  SpO2 94% General:Thin, well-appearing HEENT:Unremarkable  Neck:no JVD, no bruits, no adenopathy   Chest:Mild bibasilar inspiratory crackles, symmetrical breath sounds, no wheezes, no rhonchi  CV:RRR, grade IV/VI holosystolic murmur  Abdomen:soft, non-tender, no masses  Extremities:warm, well-perfused, pulses diminished but palpable, no lower extremity edema Rectal/GUDeferred Neuro:Grossly non-focal and symmetrical throughout Skin:Clean and dry, no rashes, no breakdown  Diagnostic Tests:  CARDIAC CATHETERIZATION  Procedures    Left Heart Cath and Coronary Angiography    Conclusion    1. Triple vessel CAD  2. Patent stent mid LAD with minimal restenosis.  3. Moderate stenosis proximal Circumflex that does not appear to be flow limiting.  4. Moderate stenosis mid LAD beyond the old stented segment 5. Mild to Moderate stenosis mid RCA 6. 3+ Mitral regurgitation 7. Normal LV systolic function  Recommendations: His clinical presentation includes acute CHF with pleuritic chest pain and dyspnea. There are no severe flow limiting lesions noted on cath although he does have moderate CAD. He is also noted to have 3+ MR on cath. Echo is pending. At this time, would follow results of echo and continue diuresis with IV Lasix. Medical management of CAD at this time but may need revascularization if mitral valve is repaired/replaced.     Indications    Coronary artery disease involving native coronary artery of native heart with unstable angina pectoris ([place]) [I25.110 (ICD-10-CM)]    Technique and Indications    Estimated blood loss <50 mL. Indication: 81 yo male with history of CAD with DES placement mid LAD in 2011 at which time he also had moderate proximal Circumflex and moderate mid LAD stenosis. Followed by Dr.  Wynonia Lawman for mitral valve prolapse. Now presenting with  acute CHF, pleuritic chest pain and dyspnea. Chest x-ray with bilateral pleural effusions. Cardiac cath to exclude progression of CAD.   Procedure: The risks, benefits, complications, treatment options, and expected outcomes were discussed with the patient. The patient and/or family concurred with the proposed plan, giving informed consent. The patient was brought to the cath lab after IV hydration was begun and oral premedication was given. The patient was further sedated with Versed and Fentanyl. The right wrist was assessed with a modified Allens test which was positive. The right wrist was prepped and draped in a sterile fashion. 1% lidocaine was used for local anesthesia. Using the modified Seldinger access technique, a 5 French sheath was placed in the right radial artery. 3 mg Verapamil was given through the sheath. 3500 units IV heparin was given. Standard diagnostic catheters were used to perform selective coronary angiography. A pigtail catheter was used to perform a left ventricular angiogram. The sheath was removed from the right radial artery and a Terumo hemostasis band was applied at the arteriotomy site on the right wrist.  No complications.    Coronary Findings    Dominance: Right   Left Anterior Descending   . Ost LAD to Prox LAD lesion, 40% stenosed. Calcified diffuse.   . Prox LAD to Mid LAD lesion, 10% stenosed. Diffuse. The lesion was previously treated with a drug-eluting stent greater than two years ago.   . Mid LAD lesion, 50% stenosed. Diffuse.   . First Diagonal Branch   . Ost 1st Diag to 1st Diag lesion, 50% stenosed. Discrete.   . Second Diagonal Branch   The vessel is small in size.     Left Circumflex  . Vessel is large.   Colon Flattery Cx to Prox Cx lesion, 65% stenosed. Discrete.   . First Obtuse Marginal Branch   The vessel is moderate in size.   Marland Kitchen Second Obtuse Marginal  Branch   The vessel is moderate in size.     Right Coronary Artery  . Vessel is large.   . Prox RCA lesion, 20% stenosed. Calcified diffuse.   . Mid RCA lesion, 40% stenosed. Calcified diffuse.   . Right Posterior Descending Artery   The vessel is small in size.      Wall Motion                 Left Heart    Left Ventricle The left ventricular size is normal. The left ventricular systolic function is normal. There are no wall motion abnormalities in the left ventricle.   Mitral Valve There is moderate (3+) mitral regurgitation.    Coronary Diagrams    Diagnostic Diagram            Implants    Name ID Temporary Type Supply   No information to display    PACS Images    Show images for Cardiac catheterization     Link to Procedure Log    Procedure Log      Hemo Data       Most Recent Value   AO Systolic Pressure  85 mmHg   AO Diastolic Pressure  52 mmHg   AO Mean  68 mmHg   LV Systolic Pressure  96 mmHg   LV Diastolic Pressure  4 mmHg   LV EDP  12 mmHg   Arterial Occlusion Pressure Extended Systolic Pressure  96 mmHg   Arterial Occlusion Pressure Extended Diastolic Pressure  52 mmHg   Arterial Occlusion Pressure Extended Mean Pressure  73 mmHg   Left Ventricular Apex Extended Systolic Pressure  97 mmHg   Left Ventricular Apex Extended Diastolic Pressure  0 mmHg   Left Ventricular Apex Extended EDP Pressure  15 mmHg     Transthoracic Echocardiography  Patient:  Jack James, Jack James MR #:    [id number] Study Date: 11/20/2015 Gender:   M Age:    55 Height:   180.3 cm Weight:   71.7 kg BSA:    1.89 m^2 Pt. Status: Room:    [id number]  Jetty Duhamel, M.D. REFERRING  Jack James, M.D. ADMITTING  Sherren Mocha, MD ATTENDING  Gareth Morgan PERFORMING  [facility],  Inpatient SONOGRAPHER Roseanna Rainbow  cc:  ------------------------------------------------------------------- LV EF: 55% -  60%  ------------------------------------------------------------------- Indications:   424.0 Mitral valve disease.  ------------------------------------------------------------------- History:  PMH:  Coronary artery disease. Angina pectoris. Congestive heart failure. Risk factors: Hypertension.  ------------------------------------------------------------------- Study Conclusions  - Left ventricle: The cavity size was normal. Wall thickness was normal. Systolic function was normal. The estimated ejection fraction was in the range of 55% to 60%. Doppler parameters are consistent with elevated ventricular end-diastolic filling pressure. - Mitral valve: Bileaflet prolapse with likely flail segment to posterior leaflet and severe MR. Patient will have TEE arranged for Monday and will need surgical consult given presentation with CHF. Valve area by continuity equation (using LVOT flow): 1.52 cm^2. - Left atrium: The atrium was mildly dilated. - Atrial septum: No defect or patent foramen ovale was identified. - Pulmonary arteries: PA peak pressure: 48 mm Hg (S). - Pericardium, extracardiac: There was a left pleural effusion.  Transthoracic echocardiography. M-mode, complete 2D, spectral Doppler, and color Doppler. Birthdate: Patient birthdate: [birth year]. Age: Patient is 81 yr old. Sex: Gender: male. BMI: 22 kg/m^2. Blood pressure:   109/62 Patient status: Inpatient. Study date: Study date: 11/20/2015. Study time: 10:45 AM. Location: Bedside.  -------------------------------------------------------------------  ------------------------------------------------------------------- Left ventricle: The cavity size was normal. Wall thickness was normal. Systolic function was normal. The estimated ejection fraction was in  the range of 55% to 60%. Doppler parameters are consistent with elevated ventricular end-diastolic filling pressure.  ------------------------------------------------------------------- Aortic valve:  Structurally normal valve.  Cusp separation was normal. Doppler: Transvalvular velocity was within the normal range. There was no stenosis. There was no regurgitation.  ------------------------------------------------------------------- Aorta: The aorta was normal, not dilated, and non-diseased.  ------------------------------------------------------------------- Mitral valve: Bileaflet prolapse with likely flail segment to posterior leaflet and severe MR. Patient will have TEE arranged for Monday and will need surgical consult given presentation with CHF. Doppler: Valve area by continuity equation (using LVOT flow): 1.52 cm^2. Indexed valve area by continuity equation (using LVOT flow): 0.8 cm^2/m^2.  Mean gradient (D): 5 mm Hg.  ------------------------------------------------------------------- Left atrium: The atrium was mildly dilated.  ------------------------------------------------------------------- Atrial septum: No defect or patent foramen ovale was identified.  ------------------------------------------------------------------- Right ventricle: The cavity size was normal. Wall thickness was normal. Systolic function was normal.  ------------------------------------------------------------------- Pulmonic valve:  Doppler: There was mild regurgitation.  ------------------------------------------------------------------- Tricuspid valve:  Doppler: There was mild regurgitation.  ------------------------------------------------------------------- Right atrium: The atrium was normal in size.  ------------------------------------------------------------------- Systemic veins: Inferior vena cava: The vessel was dilated. The respirophasic diameter changes  were blunted (< 50%), consistent with elevated central venous pressure.  ------------------------------------------------------------------- Pleura: There was a left pleural effusion.  ------------------------------------------------------------------- Post procedure conclusions Ascending Aorta:  - The aorta was normal, not dilated, and non-diseased.  ------------------------------------------------------------------- Measurements  Left ventricle  Value     Reference LV ID, ED, PLAX chordal          49  mm    43 - 52 LV ID, ES, PLAX chordal          34  mm    23 - 38 LV fx shortening, PLAX chordal      31  %    >=29 LV PW thickness, ED            9   mm    --------- IVS/LV PW ratio, ED            1       <=1.3 Stroke volume, 2D             77  ml    --------- Stroke volume/bsa, 2D           41  ml/m^2  --------- LV ejection fraction, 1-p A4C       61  %    --------- LV end-diastolic volume, 2-p       134  ml    --------- LV end-systolic volume, 2-p        59  ml    --------- LV ejection fraction, 2-p         56  %    --------- Stroke volume, 2-p            75  ml    --------- LV end-diastolic volume/bsa, 2-p     71  ml/m^2  --------- LV end-systolic volume/bsa, 2-p      31  ml/m^2  --------- Stroke volume/bsa, 2-p          39.6 ml/m^2  --------- LV e&', lateral              10.6 cm/s   --------- LV e&', medial               6.15 cm/s   --------- LV e&', average              8.38 cm/s   ---------  Ventricular septum            Value     Reference IVS thickness, ED             9   mm    ---------  LVOT                   Value      Reference LVOT ID, S                21  mm    --------- LVOT area                 3.46 cm^2   --------- LVOT peak velocity, S           119  cm/s   --------- LVOT mean velocity, S           72  cm/s   --------- LVOT VTI, S                22.2 cm    --------- LVOT peak gradient, S           6   mm Hg  ---------  Aorta                   Value     Reference Aortic root ID, ED            31  mm    ---------  Left atrium                Value     Reference LA ID, A-P, ES              39  mm    --------- LA ID/bsa, A-P              2.06 cm/m^2  <=2.2 LA volume, S               101  ml    --------- LA volume/bsa, S             53.4 ml/m^2  --------- LA volume, ES, 1-p A4C          94  ml    --------- LA volume/bsa, ES, 1-p A4C        49.7 ml/m^2  --------- LA volume, ES, 1-p A2C          92.7 ml    --------- LA volume/bsa, ES, 1-p A2C        49  ml/m^2  ---------  Mitral valve               Value     Reference Mitral mean velocity, D          99.7 cm/s   --------- Mitral deceleration time         194  ms    150 - 230 Mitral mean gradient, D          5   mm Hg  --------- Mitral E/A ratio, peak          2       --------- Mitral valve area, LVOT          1.52 cm^2   --------- continuity Mitral valve area/bsa, LVOT        0.8  cm^2/m^2 --------- continuity Mitral annulus VTI, D           50.7 cm    --------- Mitral maximal regurg velocity,      435  cm/s   --------- PISA Mitral regurg VTI, PISA          121  cm    --------- Mitral ERO, PISA              0.56 cm^2   --------- Mitral regurg volume, PISA        68  ml    ---------  Pulmonary arteries            Value     Reference PA pressure, S, DP        (H)   48  mm Hg  <=30  Tricuspid valve              Value     Reference Tricuspid regurg peak velocity      289  cm/s   --------- Tricuspid peak RV-RA gradient       33  mm Hg  ---------  Systemic veins              Value     Reference Estimated CVP               15  mm Hg  ---------  Right ventricle              Value     Reference TAPSE  33  mm    --------- RV pressure, S, DP        (H)   48  mm Hg  <=30 RV s&', lateral, S             14  cm/s   ---------  Legend: (L) and (H) mark values outside specified reference range.  ------------------------------------------------------------------- Prepared and Electronically Authenticated by  Jack James, M.D. 2016-11-26T11:46:32   TRANSESOPHAGEAL ECHOCARDIOGRAM (TEE) NOTE  INDICATIONS: myxomatous mitral valve disease, severe mitral regurgitation pre-op  PROCEDURE:   Informed consent was obtained prior to the procedure. The risks, benefits and alternatives for the procedure were discussed and the patient comprehended these risks. Risks include, but are not limited to, cough, sore throat, vomiting, nausea, somnolence, esophageal and stomach trauma or perforation, bleeding, low blood pressure, aspiration, pneumonia, infection, trauma to the teeth and death.   After a procedural time-out, the patient was given 5 mg versed and 50 mcg fentanyl for moderate sedation. The oropharynx was anesthetized 10 cc of topical 1% viscous lidocaine and 2 cetacaine sprays. The transesophageal probe was inserted in the esophagus and stomach without difficulty and  multiple views were obtained. The patient was kept under observation until the patient left the procedure room. The patient left the procedure room in stable condition.   Agitated microbubble saline contrast was not administered.  COMPLICATIONS:   There were no immediate complications.  Findings:  1. LEFT VENTRICLE: The left ventricular wall thickness is normal. The left ventricular cavity is normal in size. Wall motion is normal. LVEF is 60-65%.  2. RIGHT VENTRICLE: The right ventricle is normal in structure and function without any thrombus or masses.  3. LEFT ATRIUM: The left atrium is dilated in size without any thrombus or masses. There is not spontaneous echo contrast ("smoke") in the left atrium consistent with a low flow state.  4. LEFT ATRIAL APPENDAGE: The left atrial appendage is free of any thrombus or masses. The appendage has single lobes. Pulse doppler indicates moderate flow in the appendage.  5. ATRIAL SEPTUM: The atrial septum appears intact and is free of thrombus and/or masses. There is no evidence for interatrial shunting by color doppler.  6. RIGHT ATRIUM: The right atrium is normal in size and function without any thrombus or masses.  7. MITRAL VALVE: Well visualized in 2D and 3D views. The mitral valve is myxomatous with significant thickening of the AMVL. This leaflet is noted to prolapse. There is a flail chord attached to the PMVL (mostly P2), however, the entire leaflet appears flail. There is associated severe MR with a RVol >158mL and a vena contracta of 1.4 cm. There is reversal of flow in the pulmonary veins. Findings consistent with Severe centro-posteriorly directed regurgitation. There were no vegetations or stenosis.  8. AORTIC VALVE: The aortic valve is trileaflet, normal in structure and function with no regurgitation. There were no vegetations or stenosis  9. TRICUSPID VALVE: The tricuspid valve is normal in structure  and function with Mild regurgitation. There were no vegetations or stenosis  10. PULMONIC VALVE: The pulmonic valve is normal in structure and function with trivial regurgitation. There were no vegetations or stenosis.  11. AORTIC ARCH, ASCENDING AND DESCENDING AORTA: There was no Ron Parker et. Al, 1992) atherosclerosis of the ascending aorta, aortic arch, or proximal descending aorta.  12. PULMONARY VEINS: Anomalous pulmonary venous return was not noted. Reversal of flow in the LUPV was noted.  13. PERICARDIUM: The pericardium appeared normal and non-thickened. There is  no pericardial effusion.  IMPRESSION:   1. Severe acute MR secondary to flail/ruptured posterior leaflet chord - it appears most if not all of the posterior leaflet prolapses, there is also thickening and mild prolapse of the anterior leaflet 2. No LAA thrombus 3. No PFO by color doppler 4. LVEF 60-65% with normal wall motion  RECOMMENDATIONS:   1. Surgical evaluation is pending for possible mitral valve repair versus replacement.  Time Spent Directly with the Patient:  45 minutes   [name], MD, [facility] Attending Cardiologist [facility]  11/22/2015, 11:39 AM        Impression:  Patient has stage D severe symptomatic primary mitral regurgitation and multivessel coronary artery disease. He presents with a relatively acute onset of exacerbation of symptoms of angina pectoris and congestive heart failure. I have personally reviewed the patient's transthoracic echocardiogram, diagnostic cardiac catheterization, and transesophageal echocardiogram. 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 LV systolic function remains normal. He has moderate multivessel coronary artery disease without any high-grade stenosis. I suspect the patient's relatively acute onset of  symptoms likely corresponds to the development of ruptured chordae tendineae with a sudden increase in the severity of mitral regurgitation.   Plan:  The patient and his daughter were counseled at length regarding the indications, risks and potential benefits of mitral valve repair. The rationale for elective surgery has been explained, including a comparison between surgery and continued medical therapy with close follow-up. The likelihood of successful and durable valve repair has been discussed with particular reference to the findings of their recent echocardiogram. Based upon these findings and previous experience, I have quoted them a greater than 80 percent likelihood of successful valve repair. In the unlikely event that their valve cannot be successfully repaired, we discussed the possibility of replacing the mitral valve using a mechanical prosthesis with the attendant need for long-term anticoagulation versus the alternative of replacing it using a bioprosthetic tissue valve with its potential for late structural valve deterioration and failure, depending upon the patient's longevity. The patient specifically requests that if the mitral valve must be replaced that it be done using a bioprosthetic tissue valve. The rationale for concomitant coronary artery bypass grafting has been discussed. The patient understands and accepts all potential risks of surgery including but not limited to risk of death, stroke or other neurologic complication, myocardial infarction, congestive heart failure, respiratory failure, renal failure, bleeding requiring transfusion and/or reexploration, arrhythmia, infection or other wound complications, pneumonia, pleural and/or pericardial effusion, pulmonary embolus, aortic dissection or other major vascular complication, or delayed complications related to valve repair or replacement including but not limited to structural valve deterioration and failure, thrombosis,  embolization, endocarditis, paravalvular leak, or late recurrence of symptomatic ischemic heart disease. Expectations for the patient's postoperative convalescence have been discussed. All of their questions have been answered.  We tentatively plan for surgery on Tuesday November 30, 2015 or sooner should OR availability arise. If the patient is felt to be stable enough for discharge home during the interim period of time we will make arrangements for him to come to Short Stay and have repeat bloodwork obtained.    Rexene Alberts, MD 11/23/2015 5:31 PM

## 2015-11-24 NOTE — Discharge Summary (Signed)
Physician Discharge Summary  Patient ID: Jack James MRN: [id number] DOB/AGE: Jun 13, 1934 81 y.o.  Admit date: 11/19/2015 Discharge date: 11/24/2015  Primary Physician:  Dr. Derinda Late  Primary Discharge Diagnosis:  1.  Acute diastolic heart failure due to mitral regurgitation  Secondary Discharge Diagnosis: 2.  Severe mitral regurgitation due to partially flail posterior leaflet of the mitral valve and mitral valve prolapse 3.  Coronary artery disease with previous stenting and moderate diffuse three-vessel disease  4.  Hyperlipidemia under treatment  Procedures:  Cardiac catheterization TEE Pulmonary function testing, venous mapping  Consults:  Dr. Geronimo Boot Course: This 81 year old male has a history of coronary artery disease with stenting in 2011 with a drug-eluting stent to the LAD proximally.  He had done well but he had a murmur develop and was found to have mitral valve prolapse with moderate mitral regurgitation. Echocardiogram in August showed moderate to severe mitral regurgitation which was asymptomatic.  He was able to travel to [place] is alert latest 2 weeks prior to admission and was asymptomatic.  He developed progressive shortness of breath and chest discomfort that started about a week prior to admission and presented to the emergency room on the 25th with progressive shortness of breath and chest pain.  He was found to have a patent stent in the mid LAD with minimal restenosis.  He had moderate stenosis in the proximal circumflex the did not appear to bili flow-limiting and moderate stenosis in the mid LAD beyond the old stented segment and mild to moderate stenosis of the mid RCA.  He had 3+ mitral regurgitation.  He was given a dose of furosemide as well as nitroglycerin on admission.  TEE was done on the 28th which showed severe mitral regurgitation due to a partially flail posterior leaflet and ruptured cord.  There is also thickening and  mild prolapse of the anterior leaflet.  No PFO and normal ejection fraction of 60-65%.  The patient was seen in consultation by Dr. Roxy Manns who felt he would need mitral valve repair/replacement.  Possible as well as possible concomitant bypass for his coronary disease.  He was given additional furosemide because of continued shortness of breath and had adequate diuresis and he and his daughter very much wished to go home prior to surgery.  He was started on amiodarone 200 mg twice daily for prophylaxis for atrial fibrillation during the time of mitral valve repair and tolerated this well.  In addition he was given a single dose of Lasix and was discharged on furosemide 20 mg daily with instructions to increase the amount of furosemide if weight goes up or if worsening shortness of breath.  He was felt to be stable prior to discharge and will return on Monday for lab work and chest x-ray prior to potential surgery scheduled for Tuesday, December 6.  He is to call if there are problems.  Discharge Exam: Blood pressure 100/63, pulse 90, temperature 99.6 F (37.6 C), temperature source Oral, resp. rate 18, height 5\' 11"  (1.803 m), weight 70.171 kg (154 lb 11.2 oz), SpO2 94 %. Weight: 70.171 kg (154 lb 123XX123 oz) Holosystolic 3 to 4/6 murmur at apex, lungs were clear on discharge, weight was 154 pounds on discharge.  Labs: CBC:   Lab Results  Component Value Date   WBC 6.7 11/24/2015   HGB 12.4* 11/24/2015   HCT 39.4 11/24/2015   MCV 97.3 11/24/2015   PLT 230 11/24/2015    CMP:  Recent Labs Lab 11/23/15  0420 11/24/15 0314  NA 139 138  K 4.1 3.9  CL 109 106  CO2 24 25  BUN 17 19  CREATININE 1.07 1.09  CALCIUM 8.4* 8.4*  PROT 5.1*  --   BILITOT 0.8  --   ALKPHOS 50  --   ALT 17  --   AST 21  --   GLUCOSE 102* 97   Lipid Panel     Component Value Date/Time   CHOL 102 11/20/2015 0030   TRIG 36 11/20/2015 0030   HDL 48 11/20/2015 0030   CHOLHDL 2.1 11/20/2015 0030   VLDL 7 11/20/2015  0030   LDLCALC 47 11/20/2015 0030   BNP (last 3 results)  Recent Labs  11/19/15 0832  BNP 188.4*   Thyroid: Lab Results  Component Value Date   TSH 1.078 11/23/2015   Hemoglobin A1C: Lab Results  Component Value Date   HGBA1C 5.8* 11/23/2015    Radiology: Bilateral pleural effusions, congestive heart failure  EKG: Sinus tachycardia  Discharge Medications:   Medication List    STOP taking these medications        ibuprofen 200 MG tablet  Commonly known as:  ADVIL,MOTRIN      TAKE these medications        amiodarone 200 MG tablet  Commonly known as:  PACERONE  Take 1 tablet (200 mg total) by mouth 2 (two) times daily.     aspirin EC 81 MG tablet  Take 81 mg by mouth daily.     atorvastatin 20 MG tablet  Commonly known as:  LIPITOR  Take 20 mg by mouth daily.     cholecalciferol 1000 UNITS tablet  Commonly known as:  VITAMIN D  Take 2,000 Units by mouth daily.     famotidine 10 MG tablet  Commonly known as:  PEPCID  Take 10 mg by mouth as needed for heartburn or indigestion.     furosemide 20 MG tablet  Commonly known as:  LASIX  Take 1 tablet (20 mg total) by mouth daily.     hyoscyamine 0.125 MG tablet  Commonly known as:  LEVSIN, ANASPAZ  Take 0.125 mg by mouth every 4 (four) hours as needed for cramping.     montelukast 10 MG tablet  Commonly known as:  SINGULAIR  Take 10 mg by mouth at bedtime.     nitroGLYCERIN 0.4 MG SL tablet  Commonly known as:  NITROSTAT  Place 0.4 mg under the tongue every 5 (five) minutes as needed for chest pain.       Followup plans and appointments: He is to go home with his daughter and is to be on limited activity.  He will return on Monday to short stay center for lab work and chest x-ray in preparation for surgery.   Time spent with patient to include physician time:  45 minutes  Signed: W. Doristine Church. MD [facility] 11/24/2015, 8:51 AM

## 2015-11-25 ENCOUNTER — Inpatient Hospital Stay (HOSPITAL_COMMUNITY): Payer: Medicare Other

## 2015-11-25 ENCOUNTER — Inpatient Hospital Stay (HOSPITAL_COMMUNITY)
Admission: RE | Admit: 2015-11-25 | Discharge: 2015-12-03 | DRG: 219 | Disposition: A | Discharge status: Home / Self Care | Payer: Medicare Other | Source: Ambulatory Visit | Attending: Thoracic Surgery (Cardiothoracic Vascular Surgery) | Admitting: Thoracic Surgery (Cardiothoracic Vascular Surgery)

## 2015-11-25 ENCOUNTER — Encounter (HOSPITAL_COMMUNITY): Payer: Self-pay | Admitting: Thoracic Surgery (Cardiothoracic Vascular Surgery)

## 2015-11-25 ENCOUNTER — Encounter (HOSPITAL_COMMUNITY)
Admission: RE | Disposition: A | Discharge status: Home / Self Care | Payer: Medicare Other | Source: Ambulatory Visit | Attending: Thoracic Surgery (Cardiothoracic Vascular Surgery)

## 2015-11-25 ENCOUNTER — Inpatient Hospital Stay (HOSPITAL_COMMUNITY): Payer: Medicare Other | Admitting: Anesthesiology

## 2015-11-25 DIAGNOSIS — I97191 Other postprocedural cardiac functional disturbances following other surgery: Secondary | ICD-10-CM | POA: Diagnosis not present

## 2015-11-25 DIAGNOSIS — Z8249 Family history of ischemic heart disease and other diseases of the circulatory system: Secondary | ICD-10-CM | POA: Diagnosis not present

## 2015-11-25 DIAGNOSIS — I340 Nonrheumatic mitral (valve) insufficiency: Principal | ICD-10-CM | POA: Diagnosis present

## 2015-11-25 DIAGNOSIS — I5033 Acute on chronic diastolic (congestive) heart failure: Secondary | ICD-10-CM | POA: Diagnosis present

## 2015-11-25 DIAGNOSIS — I25810 Atherosclerosis of coronary artery bypass graft(s) without angina pectoris: Secondary | ICD-10-CM | POA: Diagnosis present

## 2015-11-25 DIAGNOSIS — D62 Acute posthemorrhagic anemia: Secondary | ICD-10-CM | POA: Diagnosis not present

## 2015-11-25 DIAGNOSIS — Z7982 Long term (current) use of aspirin: Secondary | ICD-10-CM | POA: Diagnosis not present

## 2015-11-25 DIAGNOSIS — I4891 Unspecified atrial fibrillation: Secondary | ICD-10-CM | POA: Diagnosis not present

## 2015-11-25 DIAGNOSIS — I4892 Unspecified atrial flutter: Secondary | ICD-10-CM | POA: Diagnosis not present

## 2015-11-25 DIAGNOSIS — Z9689 Presence of other specified functional implants: Secondary | ICD-10-CM

## 2015-11-25 DIAGNOSIS — I25110 Atherosclerotic heart disease of native coronary artery with unstable angina pectoris: Secondary | ICD-10-CM | POA: Diagnosis present

## 2015-11-25 DIAGNOSIS — Z955 Presence of coronary angioplasty implant and graft: Secondary | ICD-10-CM

## 2015-11-25 DIAGNOSIS — I2510 Atherosclerotic heart disease of native coronary artery without angina pectoris: Secondary | ICD-10-CM | POA: Diagnosis not present

## 2015-11-25 DIAGNOSIS — I5032 Chronic diastolic (congestive) heart failure: Secondary | ICD-10-CM | POA: Diagnosis present

## 2015-11-25 DIAGNOSIS — J9811 Atelectasis: Secondary | ICD-10-CM

## 2015-11-25 DIAGNOSIS — D72829 Elevated white blood cell count, unspecified: Secondary | ICD-10-CM | POA: Diagnosis present

## 2015-11-25 DIAGNOSIS — E785 Hyperlipidemia, unspecified: Secondary | ICD-10-CM | POA: Diagnosis present

## 2015-11-25 DIAGNOSIS — Z951 Presence of aortocoronary bypass graft: Secondary | ICD-10-CM

## 2015-11-25 DIAGNOSIS — D6959 Other secondary thrombocytopenia: Secondary | ICD-10-CM | POA: Diagnosis present

## 2015-11-25 DIAGNOSIS — Z9889 Other specified postprocedural states: Secondary | ICD-10-CM

## 2015-11-25 DIAGNOSIS — Z79899 Other long term (current) drug therapy: Secondary | ICD-10-CM | POA: Diagnosis not present

## 2015-11-25 DIAGNOSIS — J90 Pleural effusion, not elsewhere classified: Secondary | ICD-10-CM | POA: Diagnosis present

## 2015-11-25 DIAGNOSIS — I959 Hypotension, unspecified: Secondary | ICD-10-CM | POA: Diagnosis not present

## 2015-11-25 DIAGNOSIS — I341 Nonrheumatic mitral (valve) prolapse: Secondary | ICD-10-CM | POA: Diagnosis present

## 2015-11-25 HISTORY — DX: Presence of aortocoronary bypass graft: Z95.1

## 2015-11-25 HISTORY — DX: Other specified postprocedural states: Z98.890

## 2015-11-25 HISTORY — PX: TEE WITHOUT CARDIOVERSION: SHX5443

## 2015-11-25 HISTORY — PX: MITRAL VALVE REPAIR: SHX2039

## 2015-11-25 HISTORY — PX: CLIPPING OF ATRIAL APPENDAGE: SHX5773

## 2015-11-25 HISTORY — PX: CORONARY ARTERY BYPASS GRAFT: SHX141

## 2015-11-25 LAB — POCT I-STAT, CHEM 8
BUN: 24 mg/dL — ABNORMAL HIGH (ref 6–20)
BUN: 24 mg/dL — ABNORMAL HIGH (ref 6–20)
BUN: 22 mg/dL — ABNORMAL HIGH (ref 6–20)
BUN: 23 mg/dL — ABNORMAL HIGH (ref 6–20)
BUN: 21 mg/dL — ABNORMAL HIGH (ref 6–20)
BUN: 20 mg/dL (ref 6–20)
BUN: 19 mg/dL (ref 6–20)
CALCIUM ION: 1.06 mmol/L — AB (ref 1.13–1.30)
CALCIUM ION: 1.16 mmol/L (ref 1.13–1.30)
CREATININE: 1 mg/dL (ref 0.61–1.24)
CREATININE: 0.9 mg/dL (ref 0.61–1.24)
Calcium, Ion: 1.15 mmol/L (ref 1.13–1.30)
Calcium, Ion: 1.19 mmol/L (ref 1.13–1.30)
Calcium, Ion: 1.01 mmol/L — ABNORMAL LOW (ref 1.13–1.30)
Calcium, Ion: 1.13 mmol/L (ref 1.13–1.30)
Calcium, Ion: 1.09 mmol/L — ABNORMAL LOW (ref 1.13–1.30)
Chloride: 108 mmol/L (ref 101–111)
Chloride: 102 mmol/L (ref 101–111)
Chloride: 100 mmol/L — ABNORMAL LOW (ref 101–111)
Chloride: 100 mmol/L — ABNORMAL LOW (ref 101–111)
Chloride: 103 mmol/L (ref 101–111)
Chloride: 106 mmol/L (ref 101–111)
Chloride: 106 mmol/L (ref 101–111)
Creatinine, Ser: 0.9 mg/dL (ref 0.61–1.24)
Creatinine, Ser: 0.8 mg/dL (ref 0.61–1.24)
Creatinine, Ser: 0.9 mg/dL (ref 0.61–1.24)
Creatinine, Ser: 0.9 mg/dL (ref 0.61–1.24)
Creatinine, Ser: 0.9 mg/dL (ref 0.61–1.24)
GLUCOSE: 155 mg/dL — AB (ref 65–99)
GLUCOSE: 138 mg/dL — AB (ref 65–99)
Glucose, Bld: 102 mg/dL — ABNORMAL HIGH (ref 65–99)
Glucose, Bld: 135 mg/dL — ABNORMAL HIGH (ref 65–99)
Glucose, Bld: 125 mg/dL — ABNORMAL HIGH (ref 65–99)
Glucose, Bld: 162 mg/dL — ABNORMAL HIGH (ref 65–99)
Glucose, Bld: 140 mg/dL — ABNORMAL HIGH (ref 65–99)
HCT: 38 % — ABNORMAL LOW (ref 39.0–52.0)
HCT: 29 % — ABNORMAL LOW (ref 39.0–52.0)
HCT: 26 % — ABNORMAL LOW (ref 39.0–52.0)
HCT: 22 % — ABNORMAL LOW (ref 39.0–52.0)
HCT: 26 % — ABNORMAL LOW (ref 39.0–52.0)
HEMATOCRIT: 39 % (ref 39.0–52.0)
HEMATOCRIT: 18 % — AB (ref 39.0–52.0)
HEMOGLOBIN: 13.3 g/dL (ref 13.0–17.0)
HEMOGLOBIN: 7.5 g/dL — AB (ref 13.0–17.0)
HEMOGLOBIN: 6.1 g/dL — AB (ref 13.0–17.0)
Hemoglobin: 12.9 g/dL — ABNORMAL LOW (ref 13.0–17.0)
Hemoglobin: 9.9 g/dL — ABNORMAL LOW (ref 13.0–17.0)
Hemoglobin: 8.8 g/dL — ABNORMAL LOW (ref 13.0–17.0)
Hemoglobin: 8.8 g/dL — ABNORMAL LOW (ref 13.0–17.0)
POTASSIUM: 3.7 mmol/L (ref 3.5–5.1)
Potassium: 4 mmol/L (ref 3.5–5.1)
Potassium: 3.8 mmol/L (ref 3.5–5.1)
Potassium: 4 mmol/L (ref 3.5–5.1)
Potassium: 3.9 mmol/L (ref 3.5–5.1)
Potassium: 3.3 mmol/L — ABNORMAL LOW (ref 3.5–5.1)
Potassium: 4.4 mmol/L (ref 3.5–5.1)
SODIUM: 137 mmol/L (ref 135–145)
SODIUM: 140 mmol/L (ref 135–145)
Sodium: 136 mmol/L (ref 135–145)
Sodium: 138 mmol/L (ref 135–145)
Sodium: 140 mmol/L (ref 135–145)
Sodium: 136 mmol/L (ref 135–145)
Sodium: 137 mmol/L (ref 135–145)
TCO2: 25 mmol/L (ref 0–100)
TCO2: 25 mmol/L (ref 0–100)
TCO2: 27 mmol/L (ref 0–100)
TCO2: 27 mmol/L (ref 0–100)
TCO2: 23 mmol/L (ref 0–100)
TCO2: 23 mmol/L (ref 0–100)
TCO2: 21 mmol/L (ref 0–100)

## 2015-11-25 LAB — POCT I-STAT 3, ART BLOOD GAS (G3+)
ACID-BASE EXCESS: 2 mmol/L (ref 0.0–2.0)
Acid-base deficit: 3 mmol/L — ABNORMAL HIGH (ref 0.0–2.0)
BICARBONATE: 27.8 meq/L — AB (ref 20.0–24.0)
Bicarbonate: 22.7 mEq/L (ref 20.0–24.0)
O2 SAT: 98 %
O2 Saturation: 100 %
PCO2 ART: 47.9 mmHg — AB (ref 35.0–45.0)
PCO2 ART: 43.6 mmHg (ref 35.0–45.0)
PH ART: 7.372 (ref 7.350–7.450)
TCO2: 29 mmol/L (ref 0–100)
TCO2: 24 mmol/L (ref 0–100)
pH, Arterial: 7.324 — ABNORMAL LOW (ref 7.350–7.450)
pO2, Arterial: 399 mmHg — ABNORMAL HIGH (ref 80.0–100.0)
pO2, Arterial: 115 mmHg — ABNORMAL HIGH (ref 80.0–100.0)

## 2015-11-25 LAB — PROTIME-INR
INR: 1.17 (ref 0.00–1.49)
INR: 1.99 — AB (ref 0.00–1.49)
PROTHROMBIN TIME: 15 s (ref 11.6–15.2)
Prothrombin Time: 22.5 seconds — ABNORMAL HIGH (ref 11.6–15.2)

## 2015-11-25 LAB — POCT I-STAT 4, (NA,K, GLUC, HGB,HCT)
GLUCOSE: 116 mg/dL — AB (ref 65–99)
HEMATOCRIT: 25 % — AB (ref 39.0–52.0)
Hemoglobin: 8.5 g/dL — ABNORMAL LOW (ref 13.0–17.0)
POTASSIUM: 3.4 mmol/L — AB (ref 3.5–5.1)
Sodium: 137 mmol/L (ref 135–145)

## 2015-11-25 LAB — CBC
HEMATOCRIT: 29.2 % — AB (ref 39.0–52.0)
HEMATOCRIT: 20.6 % — AB (ref 39.0–52.0)
HEMOGLOBIN: 9.6 g/dL — AB (ref 13.0–17.0)
Hemoglobin: 6.7 g/dL — CL (ref 13.0–17.0)
MCH: 31.6 pg (ref 26.0–34.0)
MCH: 31.5 pg (ref 26.0–34.0)
MCHC: 32.9 g/dL (ref 30.0–36.0)
MCHC: 32.5 g/dL (ref 30.0–36.0)
MCV: 96.1 fL (ref 78.0–100.0)
MCV: 96.7 fL (ref 78.0–100.0)
PLATELETS: 150 10*3/uL (ref 150–400)
Platelets: 120 10*3/uL — ABNORMAL LOW (ref 150–400)
RBC: 3.04 MIL/uL — ABNORMAL LOW (ref 4.22–5.81)
RBC: 2.13 MIL/uL — AB (ref 4.22–5.81)
RDW: 12.8 % (ref 11.5–15.5)
RDW: 12.8 % (ref 11.5–15.5)
WBC: 13.9 10*3/uL — ABNORMAL HIGH (ref 4.0–10.5)
WBC: 14.4 10*3/uL — AB (ref 4.0–10.5)

## 2015-11-25 LAB — GLUCOSE, CAPILLARY
GLUCOSE-CAPILLARY: 164 mg/dL — AB (ref 65–99)
GLUCOSE-CAPILLARY: 90 mg/dL (ref 65–99)
Glucose-Capillary: 99 mg/dL (ref 65–99)
Glucose-Capillary: 93 mg/dL (ref 65–99)
Glucose-Capillary: 114 mg/dL — ABNORMAL HIGH (ref 65–99)

## 2015-11-25 LAB — HEMOGLOBIN AND HEMATOCRIT, BLOOD
HEMATOCRIT: 23.4 % — AB (ref 39.0–52.0)
HEMOGLOBIN: 7.5 g/dL — AB (ref 13.0–17.0)

## 2015-11-25 LAB — SURGICAL PCR SCREEN
MRSA, PCR: NEGATIVE
Staphylococcus aureus: NEGATIVE

## 2015-11-25 LAB — APTT
APTT: 43 s — AB (ref 24–37)
aPTT: 33 seconds (ref 24–37)

## 2015-11-25 LAB — CREATININE, SERUM: Creatinine, Ser: 0.92 mg/dL (ref 0.61–1.24)

## 2015-11-25 LAB — PLATELET COUNT: Platelets: 145 10*3/uL — ABNORMAL LOW (ref 150–400)

## 2015-11-25 LAB — PREPARE RBC (CROSSMATCH)

## 2015-11-25 LAB — ABO/RH: ABO/RH(D): A POS

## 2015-11-25 LAB — MAGNESIUM: Magnesium: 2.8 mg/dL — ABNORMAL HIGH (ref 1.7–2.4)

## 2015-11-25 SURGERY — CORONARY ARTERY BYPASS GRAFTING (CABG)
Anesthesia: General | Site: Chest

## 2015-11-25 MED ORDER — PHENYLEPHRINE HCL 10 MG/ML IJ SOLN
INTRAMUSCULAR | Status: AC
  Filled 2015-11-25: qty 1

## 2015-11-25 MED ORDER — SODIUM CHLORIDE 0.45 % IV SOLN
INTRAVENOUS | Status: DC | PRN
  Administered 2015-11-25: 10 mL/h via INTRAVENOUS

## 2015-11-25 MED ORDER — DOCUSATE SODIUM 100 MG PO CAPS
200.0000 mg | ORAL_CAPSULE | Freq: Every day | ORAL | Status: DC
  Administered 2015-11-27 – 2015-12-02 (×6): 200 mg via ORAL
  Filled 2015-11-25 (×7): qty 2

## 2015-11-25 MED ORDER — NITROGLYCERIN IN D5W 200-5 MCG/ML-% IV SOLN
2.0000 ug/min | INTRAVENOUS | Status: DC
  Filled 2015-11-25: qty 250

## 2015-11-25 MED ORDER — ACETAMINOPHEN 500 MG PO TABS
1000.0000 mg | ORAL_TABLET | Freq: Four times a day (QID) | ORAL | Status: AC
  Administered 2015-11-26 – 2015-11-30 (×19): 1000 mg via ORAL
  Filled 2015-11-25 (×19): qty 2

## 2015-11-25 MED ORDER — MORPHINE SULFATE (PF) 2 MG/ML IV SOLN
1.0000 mg | INTRAVENOUS | Status: DC | PRN
  Administered 2015-11-25 – 2015-11-26 (×2): 2 mg via INTRAVENOUS
  Filled 2015-11-25 (×2): qty 1

## 2015-11-25 MED ORDER — NITROGLYCERIN IN D5W 200-5 MCG/ML-% IV SOLN: 0.0000 ug/min | INTRAVENOUS | Status: DC

## 2015-11-25 MED ORDER — SODIUM CHLORIDE 0.9 % IV SOLN
INTRAVENOUS | Status: DC
Start: 2015-11-25 — End: 2015-11-26
  Filled 2015-11-25: qty 40

## 2015-11-25 MED ORDER — PROTAMINE SULFATE 10 MG/ML IV SOLN
INTRAVENOUS | Status: AC
  Filled 2015-11-25: qty 5

## 2015-11-25 MED ORDER — ACETAMINOPHEN 160 MG/5ML PO SOLN: 1000.0000 mg | Freq: Four times a day (QID) | ORAL | Status: DC

## 2015-11-25 MED ORDER — CALCIUM CHLORIDE 10 % IV SOLN
INTRAVENOUS | Status: AC
  Filled 2015-11-25: qty 10

## 2015-11-25 MED ORDER — SODIUM CHLORIDE 0.9 % IV SOLN
INTRAVENOUS | Status: DC
  Filled 2015-11-25: qty 30

## 2015-11-25 MED ORDER — VECURONIUM BROMIDE 10 MG IV SOLR
INTRAVENOUS | Status: AC
  Filled 2015-11-25: qty 10

## 2015-11-25 MED ORDER — INSULIN REGULAR HUMAN 100 UNIT/ML IJ SOLN
INTRAMUSCULAR | Status: DC
  Filled 2015-11-25: qty 2.5

## 2015-11-25 MED ORDER — ASPIRIN 81 MG PO CHEW: 324.0000 mg | CHEWABLE_TABLET | Freq: Every day | ORAL | Status: DC

## 2015-11-25 MED ORDER — SODIUM CHLORIDE 0.9 % IJ SOLN
3.0000 mL | Freq: Two times a day (BID) | INTRAMUSCULAR | Status: DC
  Administered 2015-11-26 – 2015-11-27 (×2): 3 mL via INTRAVENOUS

## 2015-11-25 MED ORDER — METOPROLOL TARTRATE 1 MG/ML IV SOLN: 2.5000 mg | INTRAVENOUS | Status: DC | PRN

## 2015-11-25 MED ORDER — EPHEDRINE SULFATE 50 MG/ML IJ SOLN
INTRAMUSCULAR | Status: DC | PRN
  Administered 2015-11-25: 5 mg via INTRAVENOUS
  Administered 2015-11-25: 10 mg via INTRAVENOUS
  Administered 2015-11-25: 5 mg via INTRAVENOUS

## 2015-11-25 MED ORDER — SODIUM CHLORIDE 0.9 % IV SOLN
1250.0000 mg | INTRAVENOUS | Status: AC
  Filled 2015-11-25: qty 1250

## 2015-11-25 MED ORDER — DEXTROSE 5 % IV SOLN
750.0000 mg | INTRAVENOUS | Status: AC
  Filled 2015-11-25: qty 750

## 2015-11-25 MED ORDER — ACETAMINOPHEN 650 MG RE SUPP
650.0000 mg | Freq: Once | RECTAL | Status: AC
  Administered 2015-11-25: 650 mg via RECTAL

## 2015-11-25 MED ORDER — LACTATED RINGERS IV SOLN
INTRAVENOUS | Status: DC
  Administered 2015-11-25: 20 mL/h via INTRAVENOUS
  Administered 2015-11-25: 10 mL/h via INTRAVENOUS

## 2015-11-25 MED ORDER — MIDAZOLAM HCL 2 MG/2ML IJ SOLN: 2.0000 mg | INTRAMUSCULAR | Status: DC | PRN

## 2015-11-25 MED ORDER — PHENYLEPHRINE HCL 10 MG/ML IJ SOLN
30.0000 ug/min | INTRAVENOUS | Status: DC
  Filled 2015-11-25: qty 2

## 2015-11-25 MED ORDER — 0.9 % SODIUM CHLORIDE (POUR BTL) OPTIME
TOPICAL | Status: DC | PRN
  Administered 2015-11-25: 6000 mL

## 2015-11-25 MED ORDER — MAGNESIUM SULFATE 50 % IJ SOLN
40.0000 meq | INTRAMUSCULAR | Status: DC
  Filled 2015-11-25: qty 10

## 2015-11-25 MED ORDER — BISACODYL 10 MG RE SUPP
10.0000 mg | Freq: Every day | RECTAL | Status: DC
  Filled 2015-11-25: qty 1

## 2015-11-25 MED ORDER — CEFUROXIME SODIUM 1.5 G IJ SOLR
1.5000 g | Freq: Two times a day (BID) | INTRAMUSCULAR | Status: AC
  Administered 2015-11-26 – 2015-11-27 (×4): 1.5 g via INTRAVENOUS
  Filled 2015-11-25 (×4): qty 1.5

## 2015-11-25 MED ORDER — METOPROLOL TARTRATE 25 MG/10 ML ORAL SUSPENSION: 12.5000 mg | Freq: Two times a day (BID) | ORAL | Status: DC

## 2015-11-25 MED ORDER — ROCURONIUM BROMIDE 100 MG/10ML IV SOLN
INTRAVENOUS | Status: DC | PRN
  Administered 2015-11-25: 20 mg via INTRAVENOUS
  Administered 2015-11-25: 80 mg via INTRAVENOUS

## 2015-11-25 MED ORDER — PROPOFOL 10 MG/ML IV BOLUS
INTRAVENOUS | Status: DC | PRN
  Administered 2015-11-25: 50 mg via INTRAVENOUS

## 2015-11-25 MED ORDER — SODIUM CHLORIDE 0.9 % IJ SOLN: 3.0000 mL | INTRAMUSCULAR | Status: DC | PRN

## 2015-11-25 MED ORDER — SODIUM CHLORIDE 0.9 % IV SOLN
Freq: Once | INTRAVENOUS | Status: AC
  Administered 2015-11-25: 10 mL/h via INTRAVENOUS

## 2015-11-25 MED ORDER — ROCURONIUM BROMIDE 50 MG/5ML IV SOLN
INTRAVENOUS | Status: AC
  Filled 2015-11-25: qty 2

## 2015-11-25 MED ORDER — CALCIUM CHLORIDE 10 % IV SOLN
INTRAVENOUS | Status: DC | PRN
  Administered 2015-11-25 (×4): 250 mg via INTRAVENOUS

## 2015-11-25 MED ORDER — MAGNESIUM SULFATE 4 GM/100ML IV SOLN
4.0000 g | Freq: Once | INTRAVENOUS | Status: AC
  Administered 2015-11-25: 4 g via INTRAVENOUS
  Filled 2015-11-25: qty 100

## 2015-11-25 MED ORDER — ASPIRIN EC 325 MG PO TBEC
325.0000 mg | DELAYED_RELEASE_TABLET | Freq: Every day | ORAL | Status: DC
  Administered 2015-11-26 – 2015-11-27 (×2): 325 mg via ORAL
  Filled 2015-11-25 (×2): qty 1

## 2015-11-25 MED ORDER — ARTIFICIAL TEARS OP OINT
TOPICAL_OINTMENT | OPHTHALMIC | Status: DC | PRN
Start: 2015-11-25 — End: 2015-11-25
  Administered 2015-11-25: 1 via OPHTHALMIC

## 2015-11-25 MED ORDER — LACTATED RINGERS IV SOLN: 500.0000 mL | Freq: Once | INTRAVENOUS | Status: DC | PRN

## 2015-11-25 MED ORDER — HEPARIN SODIUM (PORCINE) 1000 UNIT/ML IJ SOLN
INTRAMUSCULAR | Status: AC
  Filled 2015-11-25: qty 1

## 2015-11-25 MED ORDER — SODIUM CHLORIDE 0.9 % IV SOLN
INTRAVENOUS | Status: DC
  Administered 2015-11-25: 100 mL/h via INTRAVENOUS

## 2015-11-25 MED ORDER — ONDANSETRON HCL 4 MG/2ML IJ SOLN
4.0000 mg | Freq: Four times a day (QID) | INTRAMUSCULAR | Status: DC | PRN
  Administered 2015-11-26 – 2015-11-29 (×4): 4 mg via INTRAVENOUS
  Filled 2015-11-25 (×4): qty 2

## 2015-11-25 MED ORDER — DEXMEDETOMIDINE HCL IN NACL 400 MCG/100ML IV SOLN
0.1000 ug/kg/h | INTRAVENOUS | Status: DC
  Filled 2015-11-25: qty 100

## 2015-11-25 MED ORDER — MIDAZOLAM HCL 5 MG/5ML IJ SOLN
INTRAMUSCULAR | Status: DC | PRN
  Administered 2015-11-25: 3 mg via INTRAVENOUS
  Administered 2015-11-25 (×2): 2 mg via INTRAVENOUS

## 2015-11-25 MED ORDER — VECURONIUM BROMIDE 10 MG IV SOLR: INTRAVENOUS | Status: DC | PRN

## 2015-11-25 MED ORDER — POTASSIUM CHLORIDE 10 MEQ/50ML IV SOLN
10.0000 meq | INTRAVENOUS | Status: AC
  Administered 2015-11-25 (×3): 10 meq via INTRAVENOUS

## 2015-11-25 MED ORDER — PROPOFOL 10 MG/ML IV BOLUS
INTRAVENOUS | Status: AC
  Filled 2015-11-25: qty 20

## 2015-11-25 MED ORDER — ACETAMINOPHEN 160 MG/5ML PO SOLN: 650.0000 mg | Freq: Once | ORAL | Status: AC

## 2015-11-25 MED ORDER — TRAMADOL HCL 50 MG PO TABS
50.0000 mg | ORAL_TABLET | ORAL | Status: DC | PRN
  Administered 2015-11-26 – 2015-11-29 (×7): 100 mg via ORAL
  Administered 2015-12-01: 50 mg via ORAL
  Administered 2015-12-03: 100 mg via ORAL
  Filled 2015-11-25 (×4): qty 2
  Filled 2015-11-25: qty 1
  Filled 2015-11-25 (×3): qty 2

## 2015-11-25 MED ORDER — SODIUM CHLORIDE 0.9 % IV SOLN
INTRAVENOUS | Status: DC | PRN
  Administered 2015-11-25: 14:00:00 via INTRAVENOUS

## 2015-11-25 MED ORDER — DOPAMINE-DEXTROSE 3.2-5 MG/ML-% IV SOLN: 0.0000 ug/kg/min | INTRAVENOUS | Status: DC

## 2015-11-25 MED ORDER — MIDAZOLAM HCL 10 MG/2ML IJ SOLN
INTRAMUSCULAR | Status: AC
Start: 2015-11-25 — End: 2015-11-25
  Filled 2015-11-25: qty 2

## 2015-11-25 MED ORDER — SODIUM CHLORIDE 0.9 % IV SOLN
250.0000 mL | INTRAVENOUS | Status: DC
  Administered 2015-11-26: 250 mL via INTRAVENOUS

## 2015-11-25 MED ORDER — LACTATED RINGERS IV SOLN
INTRAVENOUS | Status: DC | PRN
  Administered 2015-11-25 (×5): via INTRAVENOUS

## 2015-11-25 MED ORDER — FENTANYL CITRATE (PF) 100 MCG/2ML IJ SOLN
INTRAMUSCULAR | Status: DC | PRN
  Administered 2015-11-25 (×5): 100 ug via INTRAVENOUS
  Administered 2015-11-25 (×3): 250 ug via INTRAVENOUS

## 2015-11-25 MED ORDER — INSULIN REGULAR BOLUS VIA INFUSION
0.0000 [IU] | Freq: Three times a day (TID) | INTRAVENOUS | Status: DC
  Filled 2015-11-25: qty 10

## 2015-11-25 MED ORDER — MUPIROCIN 2 % EX OINT
1.0000 "application " | TOPICAL_OINTMENT | Freq: Once | CUTANEOUS | Status: AC
  Administered 2015-11-25: 1 via TOPICAL

## 2015-11-25 MED ORDER — DEXTROSE 5 % IV SOLN
1.5000 g | INTRAVENOUS | Status: AC
  Filled 2015-11-25: qty 1.5

## 2015-11-25 MED ORDER — PHENYLEPHRINE HCL 10 MG/ML IJ SOLN
0.0000 ug/min | INTRAVENOUS | Status: DC
  Administered 2015-11-25 (×3): 100 ug/min via INTRAVENOUS
  Administered 2015-11-26: 30 ug/min via INTRAVENOUS
  Filled 2015-11-25 (×2): qty 2

## 2015-11-25 MED ORDER — PROTAMINE SULFATE 10 MG/ML IV SOLN
INTRAVENOUS | Status: DC | PRN
  Administered 2015-11-25: 40 mg via INTRAVENOUS
  Administered 2015-11-25: 50 mg via INTRAVENOUS
  Administered 2015-11-25: 20 mg via INTRAVENOUS
  Administered 2015-11-25: 40 mg via INTRAVENOUS

## 2015-11-25 MED ORDER — SODIUM CHLORIDE 0.9 % IR SOLN
Status: DC | PRN
  Administered 2015-11-25: 3000 mL

## 2015-11-25 MED ORDER — ALBUMIN HUMAN 5 % IV SOLN
INTRAVENOUS | Status: DC | PRN
  Administered 2015-11-25 (×3): via INTRAVENOUS

## 2015-11-25 MED ORDER — BISACODYL 5 MG PO TBEC
10.0000 mg | DELAYED_RELEASE_TABLET | Freq: Every day | ORAL | Status: DC
  Administered 2015-11-27 – 2015-12-02 (×6): 10 mg via ORAL
  Filled 2015-11-25 (×7): qty 2

## 2015-11-25 MED ORDER — ALBUMIN HUMAN 5 % IV SOLN
250.0000 mL | INTRAVENOUS | Status: DC | PRN
  Administered 2015-11-25 (×3): 250 mL via INTRAVENOUS
  Filled 2015-11-25 (×2): qty 250

## 2015-11-25 MED ORDER — PANTOPRAZOLE SODIUM 40 MG PO TBEC
40.0000 mg | DELAYED_RELEASE_TABLET | Freq: Every day | ORAL | Status: DC
  Administered 2015-11-27 – 2015-12-03 (×7): 40 mg via ORAL
  Filled 2015-11-25 (×7): qty 1

## 2015-11-25 MED ORDER — METOPROLOL TARTRATE 12.5 MG HALF TABLET: 12.5000 mg | ORAL_TABLET | Freq: Two times a day (BID) | ORAL | Status: DC

## 2015-11-25 MED ORDER — MORPHINE SULFATE (PF) 2 MG/ML IV SOLN: 2.0000 mg | INTRAVENOUS | Status: DC | PRN

## 2015-11-25 MED ORDER — PLASMA-LYTE 148 IV SOLN
INTRAVENOUS | Status: DC
  Filled 2015-11-25: qty 2.5

## 2015-11-25 MED ORDER — POTASSIUM CHLORIDE 2 MEQ/ML IV SOLN
80.0000 meq | INTRAVENOUS | Status: DC
  Filled 2015-11-25: qty 40

## 2015-11-25 MED ORDER — PROTAMINE SULFATE 10 MG/ML IV SOLN
INTRAVENOUS | Status: AC
  Filled 2015-11-25: qty 25

## 2015-11-25 MED ORDER — AMINOCAPROIC ACID 250 MG/ML IV SOLN
INTRAVENOUS | Status: DC
  Filled 2015-11-25: qty 40

## 2015-11-25 MED ORDER — VANCOMYCIN HCL IN DEXTROSE 1-5 GM/200ML-% IV SOLN
1000.0000 mg | Freq: Once | INTRAVENOUS | Status: AC
  Administered 2015-11-25: 1000 mg via INTRAVENOUS
  Filled 2015-11-25: qty 200

## 2015-11-25 MED ORDER — POTASSIUM CHLORIDE 10 MEQ/50ML IV SOLN
10.0000 meq | Freq: Once | INTRAVENOUS | Status: AC
  Administered 2015-11-25: 10 meq via INTRAVENOUS

## 2015-11-25 MED ORDER — FENTANYL CITRATE (PF) 250 MCG/5ML IJ SOLN
INTRAMUSCULAR | Status: AC
Start: 2015-11-25 — End: 2015-11-25
  Filled 2015-11-25: qty 25

## 2015-11-25 MED ORDER — PHENYLEPHRINE HCL 10 MG/ML IJ SOLN
INTRAMUSCULAR | Status: DC | PRN
  Administered 2015-11-25: 80 ug via INTRAVENOUS

## 2015-11-25 MED ORDER — INSULIN ASPART 100 UNIT/ML ~~LOC~~ SOLN
0.0000 [IU] | SUBCUTANEOUS | Status: DC
  Administered 2015-11-25: 4 [IU] via SUBCUTANEOUS
  Administered 2015-11-26 – 2015-11-27 (×9): 2 [IU] via SUBCUTANEOUS

## 2015-11-25 MED ORDER — EPINEPHRINE HCL 1 MG/ML IJ SOLN
0.0000 ug/min | INTRAVENOUS | Status: DC
  Filled 2015-11-25: qty 4

## 2015-11-25 MED ORDER — SODIUM CHLORIDE 0.9 % IV SOLN: Freq: Once | INTRAVENOUS | Status: DC

## 2015-11-25 MED ORDER — SODIUM CHLORIDE 0.9 % IJ SOLN
OROMUCOSAL | Status: DC | PRN
  Administered 2015-11-25 (×5): via TOPICAL

## 2015-11-25 MED ORDER — LACTATED RINGERS IV SOLN
INTRAVENOUS | Status: DC
  Administered 2015-11-25: 10 mL/h via INTRAVENOUS

## 2015-11-25 MED ORDER — SODIUM CHLORIDE 0.9 % IV SOLN
INTRAVENOUS | Status: DC | PRN
  Administered 2015-11-25: 11:00:00

## 2015-11-25 MED ORDER — OXYCODONE HCL 5 MG PO TABS: 5.0000 mg | ORAL_TABLET | ORAL | Status: DC | PRN

## 2015-11-25 MED ORDER — HEPARIN SODIUM (PORCINE) 1000 UNIT/ML IJ SOLN
INTRAMUSCULAR | Status: DC | PRN
  Administered 2015-11-25: 30000 [IU] via INTRAVENOUS
  Administered 2015-11-25: 5000 [IU] via INTRAVENOUS

## 2015-11-25 MED ORDER — MUPIROCIN 2 % EX OINT
TOPICAL_OINTMENT | CUTANEOUS | Status: AC
  Filled 2015-11-25: qty 22

## 2015-11-25 MED ORDER — METOPROLOL TARTRATE 12.5 MG HALF TABLET
ORAL_TABLET | ORAL | Status: AC
  Filled 2015-11-25: qty 1

## 2015-11-25 MED ORDER — DEXMEDETOMIDINE HCL IN NACL 200 MCG/50ML IV SOLN
0.0000 ug/kg/h | INTRAVENOUS | Status: DC
Start: 2015-11-25 — End: 2015-11-26
  Filled 2015-11-25: qty 50

## 2015-11-25 MED ORDER — PHENYLEPHRINE 40 MCG/ML (10ML) SYRINGE FOR IV PUSH (FOR BLOOD PRESSURE SUPPORT)
PREFILLED_SYRINGE | INTRAVENOUS | Status: AC
  Filled 2015-11-25: qty 10

## 2015-11-25 MED ORDER — SODIUM CHLORIDE 0.9 % IV SOLN
INTRAVENOUS | Status: DC
  Administered 2015-11-25: 20 mL/h via INTRAVENOUS

## 2015-11-25 MED ORDER — FAMOTIDINE IN NACL 20-0.9 MG/50ML-% IV SOLN
20.0000 mg | Freq: Two times a day (BID) | INTRAVENOUS | Status: AC
  Administered 2015-11-25 (×2): 20 mg via INTRAVENOUS
  Filled 2015-11-25: qty 50

## 2015-11-25 MED ORDER — DOPAMINE-DEXTROSE 3.2-5 MG/ML-% IV SOLN
0.0000 ug/kg/min | INTRAVENOUS | Status: DC
  Filled 2015-11-25: qty 250

## 2015-11-25 MED ORDER — CHLORHEXIDINE GLUCONATE 0.12 % MT SOLN
15.0000 mL | OROMUCOSAL | Status: AC
  Administered 2015-11-25: 15 mL via OROMUCOSAL
  Filled 2015-11-25: qty 15

## 2015-11-25 MED ORDER — VECURONIUM BROMIDE 10 MG IV SOLR
INTRAVENOUS | Status: DC | PRN
  Administered 2015-11-25 (×2): 5 mg via INTRAVENOUS

## 2015-11-25 MED FILL — Potassium Chloride Inj 2 mEq/ML: INTRAVENOUS | Qty: 40 | Status: AC

## 2015-11-25 MED FILL — Heparin Sodium (Porcine) Inj 1000 Unit/ML: INTRAMUSCULAR | Qty: 30 | Status: AC

## 2015-11-25 MED FILL — Magnesium Sulfate Inj 50%: INTRAMUSCULAR | Qty: 10 | Status: AC

## 2015-11-25 SURGICAL SUPPLY — 165 items
ADAPTER CARDIO PERF ANTE/RETRO (ADAPTER) ×4 IMPLANT
APPLICATOR COTTON TIP 6IN STRL (MISCELLANEOUS) IMPLANT
APPLIER CLIP 9.375 SM OPEN (CLIP) ×4
BAG DECANTER FOR FLEXI CONT (MISCELLANEOUS) ×8 IMPLANT
BANDAGE ELASTIC 4 VELCRO ST LF (GAUZE/BANDAGES/DRESSINGS) ×4 IMPLANT
BANDAGE ELASTIC 6 VELCRO ST LF (GAUZE/BANDAGES/DRESSINGS) ×4 IMPLANT
BASKET HEART (ORDER IN 25'S) (MISCELLANEOUS) ×1
BASKET HEART (ORDER IN 25S) (MISCELLANEOUS) ×3 IMPLANT
BLADE STERNUM SYSTEM 6 (BLADE) ×4 IMPLANT
BLADE SURG 11 STRL SS (BLADE) ×8 IMPLANT
BLADE SURG ROTATE 9660 (MISCELLANEOUS) ×4 IMPLANT
BNDG GAUZE ELAST 4 BULKY (GAUZE/BANDAGES/DRESSINGS) ×4 IMPLANT
BOOT SUTURE AID YELLOW STND (SUTURE) ×4 IMPLANT
CANISTER SUCTION 2500CC (MISCELLANEOUS) ×4 IMPLANT
CANN PRFSN 3/8X14X24FR PCFC (MISCELLANEOUS)
CANN PRFSN 3/8XCNCT ST RT ANG (MISCELLANEOUS)
CANNULA EZ GLIDE AORTIC 21FR (CANNULA) ×4 IMPLANT
CANNULA FEM VENOUS REMOTE 22FR (CANNULA) ×4 IMPLANT
CANNULA GUNDRY RCSP 15FR (MISCELLANEOUS) ×4 IMPLANT
CANNULA PRFSN 3/8X14X24FR PCFC (MISCELLANEOUS) IMPLANT
CANNULA PRFSN 3/8XCNCT RT ANG (MISCELLANEOUS) IMPLANT
CANNULA VEN MTL TIP RT (MISCELLANEOUS)
CANNULA VENNOUS METAL TIP 20FR (CANNULA) ×4 IMPLANT
CATH CPB KIT OWEN (MISCELLANEOUS) ×4 IMPLANT
CATH FOLEY 2WAY SLVR  5CC 14FR (CATHETERS)
CATH FOLEY 2WAY SLVR 5CC 14FR (CATHETERS) IMPLANT
CATH THORACIC 28FR RT ANG (CATHETERS) IMPLANT
CATH THORACIC 36FR (CATHETERS) ×4 IMPLANT
CLIP APPLIE 9.375 SM OPEN (CLIP) ×3 IMPLANT
CLIP FOGARTY SPRING 6M (CLIP) IMPLANT
CLIP TI MEDIUM 24 (CLIP) IMPLANT
CLIP TI WIDE RED SMALL 24 (CLIP) IMPLANT
CONN 1/2X1/2X1/2  BEN (MISCELLANEOUS) ×1
CONN 1/2X1/2X1/2 BEN (MISCELLANEOUS) ×3 IMPLANT
CONN 3/8X1/2 ST GISH (MISCELLANEOUS) ×8 IMPLANT
CONN ST 1/4X3/8  BEN (MISCELLANEOUS) ×2
CONN ST 1/4X3/8 BEN (MISCELLANEOUS) ×6 IMPLANT
CONNECTOR 1/2X3/8X1/2 3 WAY (MISCELLANEOUS) ×1
CONNECTOR 1/2X3/8X1/2 3WAY (MISCELLANEOUS) ×3 IMPLANT
CONT SPEC 4OZ CLIKSEAL STRL BL (MISCELLANEOUS) ×4 IMPLANT
COUNTER NEEDLE 20 DBL MAG RED (NEEDLE) ×4 IMPLANT
COVER SURGICAL LIGHT HANDLE (MISCELLANEOUS) IMPLANT
CRADLE DONUT ADULT HEAD (MISCELLANEOUS) ×4 IMPLANT
DERMABOND ADVANCED (GAUZE/BANDAGES/DRESSINGS) ×1
DERMABOND ADVANCED .7 DNX12 (GAUZE/BANDAGES/DRESSINGS) ×3 IMPLANT
DEVICE SUT CK QUICK LOAD INDV (Prosthesis & Implant Heart) ×12 IMPLANT
DEVICE SUT CK QUICK LOAD MINI (Prosthesis & Implant Heart) ×4 IMPLANT
DRAIN CHANNEL 32F RND 10.7 FF (WOUND CARE) ×8 IMPLANT
DRAPE BILATERAL SPLIT (DRAPES) IMPLANT
DRAPE CARDIOVASCULAR INCISE (DRAPES) ×1
DRAPE CV SPLIT W-CLR ANES SCRN (DRAPES) IMPLANT
DRAPE INCISE IOBAN 66X45 STRL (DRAPES) ×8 IMPLANT
DRAPE SLUSH/WARMER DISC (DRAPES) ×4 IMPLANT
DRAPE SRG 135X102X78XABS (DRAPES) ×3 IMPLANT
DRSG AQUACEL AG ADV 3.5X14 (GAUZE/BANDAGES/DRESSINGS) ×4 IMPLANT
DRSG COVADERM 4X14 (GAUZE/BANDAGES/DRESSINGS) ×4 IMPLANT
ELECT BLADE 4.0 EZ CLEAN MEGAD (MISCELLANEOUS) ×4
ELECT REM PT RETURN 9FT ADLT (ELECTROSURGICAL) ×8
ELECTRODE BLDE 4.0 EZ CLN MEGD (MISCELLANEOUS) ×3 IMPLANT
ELECTRODE REM PT RTRN 9FT ADLT (ELECTROSURGICAL) ×6 IMPLANT
GAUZE SPONGE 4X4 12PLY STRL (GAUZE/BANDAGES/DRESSINGS) ×8 IMPLANT
GLOVE BIO SURGEON STRL SZ 6 (GLOVE) ×8 IMPLANT
GLOVE BIO SURGEON STRL SZ 6.5 (GLOVE) ×28 IMPLANT
GLOVE BIO SURGEON STRL SZ7 (GLOVE) ×16 IMPLANT
GLOVE BIO SURGEON STRL SZ7.5 (GLOVE) ×4 IMPLANT
GLOVE BIOGEL PI IND STRL 6 (GLOVE) ×6 IMPLANT
GLOVE BIOGEL PI IND STRL 6.5 (GLOVE) ×9 IMPLANT
GLOVE BIOGEL PI IND STRL 7.0 (GLOVE) ×12 IMPLANT
GLOVE BIOGEL PI INDICATOR 6 (GLOVE) ×2
GLOVE BIOGEL PI INDICATOR 6.5 (GLOVE) ×3
GLOVE BIOGEL PI INDICATOR 7.0 (GLOVE) ×4
GLOVE ORTHO TXT STRL SZ7.5 (GLOVE) ×8 IMPLANT
GOWN STRL REUS W/ TWL LRG LVL3 (GOWN DISPOSABLE) ×21 IMPLANT
GOWN STRL REUS W/TWL LRG LVL3 (GOWN DISPOSABLE) ×7
HEMOSTAT POWDER SURGIFOAM 1G (HEMOSTASIS) ×20 IMPLANT
INSERT FOGARTY XLG (MISCELLANEOUS) ×4 IMPLANT
KIT BASIN OR (CUSTOM PROCEDURE TRAY) ×4 IMPLANT
KIT DILATOR VASC 18G NDL (KITS) ×4 IMPLANT
KIT DRAINAGE VACCUM ASSIST (KITS) ×4 IMPLANT
KIT ROOM TURNOVER OR (KITS) ×4 IMPLANT
KIT SUCTION CATH 14FR (SUCTIONS) ×12 IMPLANT
KIT SUT CK MINI COMBO 4X17 (Prosthesis & Implant Heart) ×4 IMPLANT
KIT VASOVIEW W/TROCAR VH 2000 (KITS) ×4 IMPLANT
LEAD PACING MYOCARDI (MISCELLANEOUS) ×4 IMPLANT
LINE VENT (MISCELLANEOUS) ×4 IMPLANT
MARKER GRAFT CORONARY BYPASS (MISCELLANEOUS) ×12 IMPLANT
NS IRRIG 1000ML POUR BTL (IV SOLUTION) ×20 IMPLANT
PACK OPEN HEART (CUSTOM PROCEDURE TRAY) ×4 IMPLANT
PAD ARMBOARD 7.5X6 YLW CONV (MISCELLANEOUS) ×8 IMPLANT
PAD ELECT DEFIB RADIOL ZOLL (MISCELLANEOUS) ×4 IMPLANT
PENCIL BUTTON HOLSTER BLD 10FT (ELECTRODE) ×4 IMPLANT
PUNCH AORTIC ROTATE  4.5MM 8IN (MISCELLANEOUS) ×4 IMPLANT
PUNCH AORTIC ROTATE 4.0MM (MISCELLANEOUS) IMPLANT
PUNCH AORTIC ROTATE 4.5MM 8IN (MISCELLANEOUS) IMPLANT
PUNCH AORTIC ROTATE 5MM 8IN (MISCELLANEOUS) IMPLANT
RING RECHORD MEMO 3D 32MM (Vascular Products) ×4 IMPLANT
SET CARDIOPLEGIA MPS 5001102 (MISCELLANEOUS) ×4 IMPLANT
SET IRRIG TUBING LAPAROSCOPIC (IRRIGATION / IRRIGATOR) ×4 IMPLANT
SOLUTION ANTI FOG 6CC (MISCELLANEOUS) ×8 IMPLANT
SPONGE GAUZE 4X4 12PLY STER LF (GAUZE/BANDAGES/DRESSINGS) ×8 IMPLANT
SPONGE LAP 18X18 X RAY DECT (DISPOSABLE) ×8 IMPLANT
SPONGE LAP 4X18 X RAY DECT (DISPOSABLE) ×4 IMPLANT
SUCKER INTRACARDIAC WEIGHTED (SUCKER) ×4 IMPLANT
SUT BONE WAX W31G (SUTURE) ×4 IMPLANT
SUT ETHIBOND (SUTURE) ×8 IMPLANT
SUT ETHIBOND 2 0 SH (SUTURE) ×6 IMPLANT
SUT ETHIBOND 2 0 SH 36X2 (SUTURE) ×6 IMPLANT
SUT ETHIBOND 2 0 V4 (SUTURE) IMPLANT
SUT ETHIBOND 2 0V4 GREEN (SUTURE) IMPLANT
SUT ETHIBOND 2-0 RB-1 WHT (SUTURE) ×8 IMPLANT
SUT ETHIBOND 4 0 TF (SUTURE) IMPLANT
SUT ETHIBOND 5 0 C 1 30 (SUTURE) IMPLANT
SUT ETHIBOND X763 2 0 SH 1 (SUTURE) ×12 IMPLANT
SUT GORETEX CV-5THC-13 36IN (SUTURE) ×36 IMPLANT
SUT MNCRL AB 3-0 PS2 18 (SUTURE) ×8 IMPLANT
SUT MNCRL AB 4-0 PS2 18 (SUTURE) ×4 IMPLANT
SUT PDS AB 1 CTX 36 (SUTURE) ×8 IMPLANT
SUT PROLENE 2 0 SH DA (SUTURE) IMPLANT
SUT PROLENE 3 0 SH 1 (SUTURE) ×4 IMPLANT
SUT PROLENE 3 0 SH DA (SUTURE) ×8 IMPLANT
SUT PROLENE 3 0 SH1 36 (SUTURE) IMPLANT
SUT PROLENE 4 0 RB 1 (SUTURE) ×1
SUT PROLENE 4 0 SH DA (SUTURE) ×8 IMPLANT
SUT PROLENE 4-0 RB1 .5 CRCL 36 (SUTURE) ×3 IMPLANT
SUT PROLENE 5 0 C 1 36 (SUTURE) ×8 IMPLANT
SUT PROLENE 6 0 C 1 30 (SUTURE) ×8 IMPLANT
SUT PROLENE 7.0 RB 3 (SUTURE) ×16 IMPLANT
SUT PROLENE 8 0 BV175 6 (SUTURE) ×4 IMPLANT
SUT PROLENE BLUE 7 0 (SUTURE) ×8 IMPLANT
SUT PROLENE POLY MONO (SUTURE) IMPLANT
SUT PTFE CHORD X 24MM (SUTURE) ×8 IMPLANT
SUT SILK  1 MH (SUTURE) ×7
SUT SILK 1 MH (SUTURE) ×21 IMPLANT
SUT SILK 1 TIES 10X30 (SUTURE) ×4 IMPLANT
SUT SILK 2 0 SH CR/8 (SUTURE) ×8 IMPLANT
SUT SILK 2 0 TIES 10X30 (SUTURE) ×4 IMPLANT
SUT SILK 2 0 TIES 17X18 (SUTURE) ×2
SUT SILK 2-0 18XBRD TIE BLK (SUTURE) ×6 IMPLANT
SUT SILK 3 0 SH CR/8 (SUTURE) ×4 IMPLANT
SUT SILK 4 0 TIE 10X30 (SUTURE) ×4 IMPLANT
SUT STEEL 6MS V (SUTURE) ×8 IMPLANT
SUT STEEL STERNAL CCS#1 18IN (SUTURE) IMPLANT
SUT STEEL SZ 6 DBL 3X14 BALL (SUTURE) IMPLANT
SUT TEM PAC WIRE 2 0 SH (SUTURE) ×16 IMPLANT
SUT VIC AB 1 CTX 36 (SUTURE)
SUT VIC AB 1 CTX36XBRD ANBCTR (SUTURE) IMPLANT
SUT VIC AB 2-0 CT1 27 (SUTURE) ×1
SUT VIC AB 2-0 CT1 TAPERPNT 27 (SUTURE) ×3 IMPLANT
SUT VIC AB 2-0 CTX 27 (SUTURE) ×8 IMPLANT
SUT VIC AB 3-0 SH 27 (SUTURE) ×2
SUT VIC AB 3-0 SH 27X BRD (SUTURE) ×6 IMPLANT
SUT VIC AB 3-0 X1 27 (SUTURE) ×8 IMPLANT
SUT VICRYL 4-0 PS2 18IN ABS (SUTURE) IMPLANT
SUTURE E-PAK OPEN HEART (SUTURE) IMPLANT
SYS ARTICLIP LAA EXCLUSION 145 (Clip) ×4 IMPLANT
SYSTEM SAHARA CHEST DRAIN ATS (WOUND CARE) ×4 IMPLANT
TAPE CLOTH SURG 4X10 WHT LF (GAUZE/BANDAGES/DRESSINGS) ×8 IMPLANT
TAPE PAPER 2X10 WHT MICROPORE (GAUZE/BANDAGES/DRESSINGS) ×4 IMPLANT
TOWEL OR 17X24 6PK STRL BLUE (TOWEL DISPOSABLE) ×8 IMPLANT
TOWEL OR 17X26 10 PK STRL BLUE (TOWEL DISPOSABLE) ×8 IMPLANT
TRAY FOLEY IC TEMP SENS 16FR (CATHETERS) ×4 IMPLANT
TUBING INSUFFLATION (TUBING) ×4 IMPLANT
TUBING INSUFFLATION 10FT LAP (TUBING) IMPLANT
UNDERPAD 30X30 INCONTINENT (UNDERPADS AND DIAPERS) ×4 IMPLANT
WATER STERILE IRR 1000ML POUR (IV SOLUTION) ×8 IMPLANT

## 2015-11-25 NOTE — Brief Op Note (Addendum)
11/25/2015  1:10 PM  PATIENT:  Jack James  81 y.o. male  PRE-OPERATIVE DIAGNOSIS:  CAD MR  POST-OPERATIVE DIAGNOSIS:  CAD MR  PROCEDURE:  Procedure(s):  CORONARY ARTERY BYPASS GRAFTING x 3 -LIMA to LAD -SVG to RCA -SVG to OM 2  ENDOSCOPIC HARVEST GREATER SAPHENOUS VEIN  -Right Thigh  CLIPPING OF LEFT ATRIAL APPENDAGE  MITRAL VALVE REPAIR (MVR) (N/A) -Complex Valvuloplasty     -Quadrangular Resection of the Posterior Leaflet   -Placement of 10 Neochords   -Ring Annuloplasty with a 31 Memo Rechord Ring  DRAINAGE OF LEFT AND RIGHT PLEURAL EFFUSIONS  TRANSESOPHAGEAL ECHOCARDIOGRAM (TEE) (N/A)  SURGEON:    Rexene Alberts, MD  ASSISTANTS:  Ellwood Handler, PA-C  ANESTHESIA:   Jillyn Hidden, MD  CROSSCLAMP TIME:   173'  CARDIOPULMONARY BYPASS TIME: 197'  FINDINGS:  Fibroelastic deficiency type degenerative disease  Multiple ruptured primary chordae tendinae  Bileaflet prolapse with large flail segment of posterior leaflet  Type II dysfunction with severe mitral regurgitation  Normal LV systolic function  No residual mitral regurgitation after successful mitral valve repair   Mitral Valve Etiology  MV Insufficiency: Severe  MV Disease: Yes.  MV Stenosis: No mitral valve stenosis.  MV Disease Functional Class: MV Disease Functional Class: Type II.  Etiology (Choose at least one and up to five): Degenerative.  MV Lesions (Choose at least one): Leaflet prolapse, bileaflet. and Elongated/ruptured chords(s).     Mitral/Tricuspid/Pulmonary Valve Procedure  Mitral Valve Procedure Performed:  Repair: Annuloplasty., Leaflet Resection. Resection Type: Quadrangular. Mitral Leaflet Resection Location: Posterior. and Neochrods. Number of Neochords Inserted: 10  Implant: Annuloplasty Device: Implant model number [id number], Size 32, Unique Device Identifier [id number].   COMPLICATIONS: None  BASELINE WEIGHT: 70 kg  PATIENT DISPOSITION:   TO SICU IN STABLE  CONDITION  Rexene Alberts, MD 11/25/2015 2:50 PM

## 2015-11-25 NOTE — Interval H&P Note (Signed)
History and Physical Interval Note:  11/25/2015 6:40 AM  Jack James  has presented today for surgery, with the diagnosis of CAD MR  The various methods of treatment have been discussed with the patient and family. After consideration of risks, benefits and other options for treatment, the patient has consented to  Procedure(s): CORONARY ARTERY BYPASS GRAFTING (CABG) (N/A) MITRAL VALVE REPAIR (MVR) (N/A) TRANSESOPHAGEAL ECHOCARDIOGRAM (TEE) (N/A) as a surgical intervention .  The patient's history has been reviewed, patient examined, no change in status, stable for surgery.  I have reviewed the patient's chart and labs.  Questions were answered to the patient's satisfaction.     Rexene Alberts

## 2015-11-25 NOTE — Anesthesia Procedure Notes (Signed)
Procedure Name: Intubation Date/Time: 11/25/2015 7:53 AM Performed by: [place] Pink Pre-anesthesia Checklist: Patient identified, Emergency Drugs available, Suction available and Patient being monitored Patient Re-evaluated:Patient Re-evaluated prior to inductionOxygen Delivery Method: Circle system utilized Preoxygenation: Pre-oxygenation with 100% oxygen Intubation Type: IV induction Ventilation: Mask ventilation without difficulty Laryngoscope Size: Mac and 4 Grade View: Grade I Tube size: 8.0 mm Number of attempts: 1 Placement Confirmation: ETT inserted through vocal cords under direct vision,  positive ETCO2 and breath sounds checked- equal and bilateral Secured at: 23 cm Tube secured with: Tape Dental Injury: Teeth and Oropharynx as per pre-operative assessment

## 2015-11-25 NOTE — Anesthesia Preprocedure Evaluation (Signed)
Anesthesia Evaluation  Patient identified by MRN, date of birth, ID band Patient awake    Reviewed: Allergy & Precautions, H&P , NPO status , Patient's Chart, lab work & pertinent test results  History of Anesthesia Complications Negative for: history of anesthetic complications  Airway Mallampati: I  TM Distance: >3 FB Neck ROM: full    Dental no notable dental hx. (+) Dental Advisory Given   Pulmonary neg pulmonary ROS,    Pulmonary exam normal breath sounds clear to auscultation       Cardiovascular + CAD and +CHF  Normal cardiovascular exam+ Valvular Problems/Murmurs MR  Rhythm:regular Rate:Normal  CAD with DES to LAD in 2011, now found to have 3 vessel disease in addition to flail posterior leaflet segment, patient with diastolic heart failure but preserved systolic function of [id number]   Neuro/Psych negative neurological ROS     GI/Hepatic negative GI ROS, Neg liver ROS,   Endo/Other  negative endocrine ROS  Renal/GU negative Renal ROS     Musculoskeletal   Abdominal   Peds  Hematology negative hematology ROS (+)   Anesthesia Other Findings Patient is thin  Reproductive/Obstetrics negative OB ROS                             Anesthesia Physical Anesthesia Plan  ASA: IV  Anesthesia Plan: General   Post-op Pain Management:    Induction: Intravenous  Airway Management Planned: Oral ETT  Additional Equipment: CVP, 3D TEE, TEE, Arterial line, PA Cath and Ultrasound Guidance Line Placement  Intra-op Plan:   Post-operative Plan: Post-operative intubation/ventilation  Informed Consent: I have reviewed the patients History and Physical, chart, labs and discussed the procedure including the risks, benefits and alternatives for the proposed anesthesia with the patient or authorized representative who has indicated his/her understanding and acceptance.   Dental Advisory Given  Plan  Discussed with: Anesthesiologist, CRNA and Surgeon  Anesthesia Plan Comments:         Anesthesia Quick Evaluation

## 2015-11-25 NOTE — Progress Notes (Signed)
  Echocardiogram Echocardiogram Transesophageal has been performed.  Jennette Dubin 11/25/2015, 9:23 AM

## 2015-11-25 NOTE — OR Nursing (Signed)
1344 first call made to SICU, 1401 Second call made to [facility], 1439 3rd call made to SICU

## 2015-11-25 NOTE — Progress Notes (Signed)
CRITICAL VALUE ALERT  Critical value received: Hg= 6.7  Date of notification: t  Time of notification: 2102    Critical value read back:Yes.    Nurse who received alert:  Hettie Holstein   MD notified (1st page): Dr Roxy Manns  Time of first page: on phone  MD notified (2nd page):  Time of second page:  Responding MD: Dr Roxy Manns  Time MD responded:  On phone

## 2015-11-25 NOTE — Anesthesia Postprocedure Evaluation (Signed)
Anesthesia Post Note  Patient: Jack James  Procedure(s) Performed: Procedure(s) (LRB): CORONARY ARTERY BYPASS GRAFTING (CABG) TIMES THREE USING LEFT INTERNAL MAMMARY ARTERY AND RIGHT SAPHENOUS LEG VEIN HARVESTED ENDOSCOPICALLY (N/A) MITRAL VALVE REPAIR (MVR) (N/A) TRANSESOPHAGEAL ECHOCARDIOGRAM (TEE) (N/A) CLIPPING OF ATRIAL APPENDAGE  Patient location during evaluation: SICU Anesthesia Type: General Level of consciousness: sedated Pain management: pain level controlled Vital Signs Assessment: post-procedure vital signs reviewed and stable Respiratory status: patient remains intubated per anesthesia plan and patient on ventilator - see flowsheet for VS Cardiovascular status: stable Anesthetic complications: no    Last Vitals:  Filed Vitals:   11/25/15 2030 11/25/15 2045  BP:    Pulse: 90 88  Temp: 36.7 C 36.7 C  Resp: 14 12    Last Pain:  Filed Vitals:   11/25/15 2047  PainSc: 0-No pain    LLE Motor Response: Purposeful movement   RLE Motor Response: Purposeful movement        Benjamim Harnish A

## 2015-11-25 NOTE — Op Note (Signed)
CARDIOTHORACIC SURGERY OPERATIVE NOTE  Date of Procedure:  11/25/2015  Preoperative Diagnosis:   Severe Mitral Regurgitation  3-vessel Coronary Artery Disease  Acute Diastolic Congestive Heart Failure  Bilateral Pleural Effusions  Postoperative Diagnosis: Same  Procedure:    Mitral Valve Repair  Complex valvuloplasty including quadrangular resection of posterior leaflet  Artificial Gore-tex Neochord placement x10  Sorin Memo 3D Rechord ring annuloplasty (size 33mm, catalog [id number], serial [id number])   Coronary Artery Bypass Grafting x 3   Left Internal Mammary Artery to Distal Left Anterior Descending Coronary Artery  Saphenous Vein Graft to Distal Right Coronary Artery  Saphenous Vein Graft to Second Obtuse Marginal Branch of Left Circumflex Coronary Artery  Endoscopic Vein Harvest from Right Thigh   Clipping of Left Atrial Appendage  Atricure Pro Clip (size 45 mm)   Drainage of Bilateral Pleural Effusions   Surgeon: Valentina Gu. Roxy Manns, MD  Assistant: Ellwood Handler, PA-C  Anesthesia: Jillyn Hidden, MD  Operative Findings:  Fibroelastic deficiency type degenerative disease  Multiple ruptured primary chordae tendinae  Bileaflet prolapse with large flail segment of posterior leaflet  Type II dysfunction with severe mitral regurgitation  Normal LV systolic function  Small caliber but otherwise good quality LIMA conduit for grafting  Good quality SVG conduit for grafting  Good quality target vessels for grafting  No residual mitral regurgitation after successful mitral valve repair                    BRIEF CLINICAL NOTE AND INDICATIONS FOR SURGERY  Patient is a 81 year old male with history of mitral valve prolapse, coronary artery disease, and chronic diastolic congestive heart failure who was admitted to the hospital 11/19/2015 with symptoms consistent with unstable angina and acute exacerbation of chronic diastolic congestive heart failure  and has been referred for surgical consultation to discuss treatment options for management of mitral regurgitation and coronary artery disease. The patient states that he has been told that he had a heart murmur for many years, as long as he can remember. In 2011 the patient was noted to report occasional symptoms of substernal chest tightness with exertion and his primary care physician noted a change in the character of his murmur and he was referred for cardiology consultation. A stress test was performed demonstrating findings consistent with anterior wall ischemia, and he subsequently underwent diagnostic cardiac catheterization followed by PCI and stenting of the proximal left anterior descending coronary artery using a drug-eluting stent. He has done well clinically since that time and has been followed intermittently by Dr. Wynonia Lawman. Early last week the patient began to experience worsening symptoms of exertional chest tightness and shortness of breath. Symptoms progressed over several days, prompting presentation to the hospital on 11/19/2015. Serial troponin levels remained minimally elevated to a peak troponin I level of 0.06. BNP level was mildly elevated to 188 on admission and Chest x-ray revealed interstitial pulmonary edema and moderate effusions consistent with acute exacerbation of congestive heart failure superimposed on COPD. The patient underwent left heart catheterization by Dr. Angelena Form revealing moderate multivessel coronary artery disease including patent stent in the proximal left anterior descending coronary artery with 50% stenosis of the mid left anterior descending coronary artery beyond the distal end of the stent, 65% proximal stenosis of a large left circumflex coronary artery, and 40-50% stenosis of the mid right coronary artery. Right heart catheterization was not performed. Transthoracic echocardiogram performed 11/20/2015 revealed bileaflet prolapse of the mitral valve with a  likely flail segment of the posterior  leaflet and severe mitral regurgitation. The patient was referred for surgical consultation and transesophageal echocardiogram was performed, confirming the presence of multiple ruptured chordae tendinae with severe mitral regurgitation. The patient was seen in consultation and plans made for elective mitral valve repair and coronary artery bypass grafting. The patient was started on amiodarone for prophylaxis of atrial fibrillation. The patient has been seen in consultation and counseled at length regarding the indications, risks and potential benefits of surgery.  All questions have been answered, and the patient provides full informed consent for the operation as described.    DETAILS OF THE OPERATIVE PROCEDURE  Preparation:  The patient is brought to the operating room on the above mentioned date and central monitoring was established by the anesthesia team including placement of Swan-Ganz catheter and radial arterial line. The patient is placed in the supine position on the operating table.  Intravenous antibiotics are administered. General endotracheal anesthesia is induced uneventfully. A Foley catheter is placed.  Baseline transesophageal echocardiogram was performed.  Findings were notable for bileaflet prolapse of the mitral valve with a large flail segment of the posterior leaflet and severe (4+) mitral regurgitation. There was normal left ventricular systolic function. Aortic valve appeared normal. Right ventricular size and systolic function was normal. There was mild tricuspid regurgitation. The tricuspid annulus measured less than 3 cm diameter.  The patient's chest, abdomen, both groins, and both lower extremities are prepared and draped in a sterile manner. A time out procedure is performed.   Surgical Approach, Drainage of Pleural Effusions, and Conduit Harvest:  A median sternotomy incision was performed.  Both the left and right pleural  spaces are opened. There are bilateral pleural effusions noted with thin serous fluid. A total of 700 mL of fluid is evacuated from the left chest and 1500 mL of fluid from the right chest.  The left internal mammary artery is dissected from the chest wall and prepared for bypass grafting. The left internal mammary artery is notably small caliber but otherwise good quality conduit. Simultaneously, the greater saphenous vein is obtained from the patient's right thigh using endoscopic vein harvest technique. The saphenous vein is notably good quality conduit. After removal of the saphenous vein, the small surgical incisions in the lower extremity are closed with absorbable suture. Following systemic heparinization, the left internal mammary artery was transected distally noted to have excellent flow.   Extracorporeal Cardiopulmonary Bypass and Myocardial Protection:  The right common femoral vein is cannulated using the Seldinger technique and a guidewire advanced into the right atrium using TEE guidance.  The patient is heparinized systemically and the right common femoral vein cannulated using a 22 Fr long femoral venous cannula.  The ascending aorta is cannulated for cardiopulmonary bypass.  Adequate heparinization is verified.   A retrograde cardioplegia cannula is placed through the right atrium into the coronary sinus.   The entire pre-bypass portion of the operation was notable for stable hemodynamics.  Cardiopulmonary bypass was begun and the surface of the heart is inspected.  Distal target vessels are selected for coronary artery bypass grafting.  A second venous cannula is placed directly into the superior vena cava.   A cardioplegia cannula is placed in the ascending aorta.  A temperature probe was placed in the interventricular septum.  The operative field is continuously flooded with carbon dioxide.  The patient is cooled to 28C systemic temperature.  The aortic cross clamp is applied and  cold blood cardioplegia is delivered initially in an antegrade fashion  through the aortic root.   Supplemental cardioplegia is given retrograde through the coronary sinus catheter.  Iced saline slush is applied for topical hypothermia.  The initial cardioplegic arrest is rapid with early diastolic arrest.  Repeat doses of cardioplegia are administered intermittently throughout the entire cross clamp portion of the operation through the aortic root, through subsequently placed bypass grafts, and through the coronary sinus catheter in order to maintain completely flat electrocardiogram and septal myocardial temperature below 15C.  Myocardial protection was felt to be excellent.   Coronary Artery Bypass Grafting:   The distal right coronary artery was grafted using a reversed saphenous vein graft in an end-to-side fashion.  At the site of distal anastomosis the target vessel was good quality and measured approximately 2.0 mm in diameter.  The second obtuse marginal branch of the left circumflex coronary artery was grafted using a reversed saphenous vein graft in an end-to-side fashion.  At the site of distal anastomosis the target vessel was good quality and measured approximately 2.0 mm in diameter.  The distal left anterior coronary artery was grafted with the left internal mammary artery in an end-to-side fashion.  At the site of distal anastomosis the target vessel was good quality and measured approximately 1.3 mm in diameter.   Clipping of Left Atrial Appendage:  The left atrial appendage is obliterated by application of a left atrial clip (Atricure ProClip, size 45 mm).   Mitral Valve Repair:  A left atriotomy incision was performed through the interatrial groove and extended partially across the back wall of the left atrium after opening the oblique sinus inferiorly.  The mitral valve is exposed using a self-retaining retractor.  The mitral valve was inspected and notable for fibroelastic  deficiency type degenerative disease with multiple ruptured primary chordae tendineae involving the entire P2 segment and portions of the P3 segment of the posterior leaflet.  There is elongation without ruptured chordae tendineae to the anterior leaflet. There is no annular calcification.  Interrupted 2-0 Ethibond horizontal mattress sutures were placed circumferentially around the entire mitral annulus.  These sutures will ultimately be utilized for ring annuloplasty, and at this juncture they are utilized to suspend the valve symmetrically.   Chord-X artificial Gore-Tex multi-strand pre-measured loops are utilized to replace multiple ruptured cords to the posterior leaflet. The appropriate length of primary chordae tendineae is measured directly from non-prolapsed portions of the anterior leaflet and the distance from the posterior annulus to head of the papillary muscle.  A CV-4 Chord-X multi-strand cord is placed through the head of the posterior papillary muscle and tied. Similarly, a second CV-4 Chord-X multi-strand cord is placed through the head of the anterior papillary muscle and tied.    A portion of the flail P2 segment of the posterior leaflet is resected using a quadrangular resection. A total of approximately 40% surface area of P2 is removed. A single figure-of-eight 2-0 Ethibond compression sutures placed at the apex of the quadrangular resection and the intervening vertical defect in the posterior leaflet is closed using interrupted everting simple CV-5 Gore-Tex sutures.  Each of the individual pre-measured Gore-Tex loops are now reimplanted into the free margin of the posterior leaflet. A total of 10 to artificial Gore-Tex cords are placed including 3 pairs from the anterior papillary muscle and 2 pairs rom the posterior papillary muscle. The final pair is discarded.  The valve was tested with saline and appeared competent even without ring annuloplasty complete. The valve was sized to  a 32 mm  annuloplasty ring, based upon the transverse distance between the left and right commissures and the height of the anterior leaflet, corresponding to a size just slightly larger than the overall surface area of the anterior leaflet.  A Sorin Memo 3D Rechord annuloplasty ring (size 32 mm, catalog [id number], serial [id number]) was secured in place uneventfully.   The valve was tested with saline and appears to be perfectly competent with a broad symmetrical line of coaptation of the anterior and posterior leaflet. There is no residual leak. There was a broad, symmetrical line of coaptation of the anterior and posterior leaflet which was confirmed using the blue ink test.  Rewarming is begun.  The atriotomy was closed using a 2-layer closure of running 3-0 Prolene suture after placing a sump drain across the mitral valve to serve as a left ventricular vent.     Procedure Completion:  All proximal vein graft anastomoses were placed directly to the ascending aorta prior to removal of the aortic cross clamp.  The septal myocardial temperature rose rapidly after reperfusion of the left internal mammary artery graft.  One final dose of warm retrograde "hot shot" cardioplegia was administered retrograde through the coronary sinus catheter while all air was evacuated through the aortic root.  The aortic cross clamp was removed after a total cross clamp time of 173 minutes.  All proximal and distal coronary anastomoses were inspected for hemostasis and appropriate graft orientation.  Epicardial pacing wires are fixed to the right ventricular outflow tract and to the right atrial appendage. The patient is rewarmed to 37C temperature. The aortic and left ventricular vents are removed.  The patient is weaned and disconnected from cardiopulmonary bypass.  The patient's rhythm at separation from bypass was sinus.  The patient was weaned from cardioplegic bypass without any inotropic support. Total cardiopulmonary  bypass time for the operation was 197 minutes.  Followup transesophageal echocardiogram performed after separation from bypass revealed a well-seated mitral annuloplasty ring and a mitral valve that was functioning normally and without any residual mitral regurgitation.  Left ventricular function was unchanged from preoperatively.  The aortic and superior vena cava cannula were removed uneventfully. Protamine was administered to reverse the anticoagulation. The femoral venous cannula was removed and manual pressure held on the groin for 30 minutes.  The mediastinum and pleural space were inspected for hemostasis and irrigated with saline solution. The mediastinum and both pleural spaces were drained using 4 chest tubes placed through separate stab incisions inferiorly.  The soft tissues anterior to the aorta were reapproximated loosely. The sternum is closed with double strength sternal wire. The soft tissues anterior to the sternum were closed in multiple layers and the skin is closed with a running subcuticular skin closure.   The post-bypass portion of the operation was notable for stable rhythm and hemodynamics.   No blood products were administered during the operation.   Disposition:  The patient tolerated the procedure well and is transported to the surgical intensive care in stable condition. There are no intraoperative complications. All sponge instrument and needle counts are verified correct at completion of the operation.    Valentina Gu. Roxy Manns MD 11/25/2015 3:01 PM

## 2015-11-25 NOTE — Transfer of Care (Signed)
Immediate Anesthesia Transfer of Care Note  Patient: Jack James  Procedure(s) Performed: Procedure(s): CORONARY ARTERY BYPASS GRAFTING (CABG) TIMES THREE USING LEFT INTERNAL MAMMARY ARTERY AND RIGHT SAPHENOUS LEG VEIN HARVESTED ENDOSCOPICALLY (N/A) MITRAL VALVE REPAIR (MVR) (N/A) TRANSESOPHAGEAL ECHOCARDIOGRAM (TEE) (N/A) CLIPPING OF ATRIAL APPENDAGE  Patient Location: ICU  Anesthesia Type:General  Level of Consciousness: sedated, unresponsive and Patient remains intubated per anesthesia plan  Airway & Oxygen Therapy: Patient remains intubated per anesthesia plan and Patient placed on Ventilator (see vital sign flow sheet for setting)  Post-op Assessment: Report given to RN and Post -op Vital signs reviewed and stable  Post vital signs: Reviewed and stable  Last Vitals:  Filed Vitals:   11/25/15 0733 11/25/15 1523  BP:  104/62  Pulse:  100  Temp: 36.4 C   Resp:  12    Complications: No apparent anesthesia complications

## 2015-11-25 NOTE — Progress Notes (Signed)
Patient ID: Jack James, male   DOB: 13-Jun-1934, 81 y.o.   MRN: [id number]   SICU Evening Rounds:   Hemodynamically stable  CI = 3.2   Has started to wake up on vent.    Initially had some bleeding but received FFP and Plts and it stopped.   CT output low now  Urine output good  CBC    Component Value Date/Time   WBC 14.4* 11/25/2015 2035   RBC 2.13* 11/25/2015 2035   HGB 6.7* 11/25/2015 2035   HCT 20.6* 11/25/2015 2035   PLT 150 11/25/2015 2035   MCV 96.7 11/25/2015 2035   MCH 31.5 11/25/2015 2035   MCHC 32.5 11/25/2015 2035   RDW 12.8 11/25/2015 2035     BMET    Component Value Date/Time   NA 137 11/25/2015 1535   K 3.4* 11/25/2015 1535   CL 106 11/25/2015 1357   CO2 25 11/24/2015 0314   GLUCOSE 116* 11/25/2015 1535   BUN 20 11/25/2015 1357   CREATININE 0.90 11/25/2015 1357   CALCIUM 8.4* 11/24/2015 0314   GFRNONAA >60 11/24/2015 0314   GFRAA >60 11/24/2015 0314     A/P:  Stable postop course. Continue current plans

## 2015-11-26 ENCOUNTER — Inpatient Hospital Stay (HOSPITAL_COMMUNITY): Payer: Medicare Other

## 2015-11-26 ENCOUNTER — Encounter (HOSPITAL_COMMUNITY): Payer: Self-pay | Admitting: Thoracic Surgery (Cardiothoracic Vascular Surgery)

## 2015-11-26 LAB — BASIC METABOLIC PANEL
ANION GAP: 8 (ref 5–15)
BUN: 18 mg/dL (ref 6–20)
CHLORIDE: 108 mmol/L (ref 101–111)
CO2: 23 mmol/L (ref 22–32)
CREATININE: 0.89 mg/dL (ref 0.61–1.24)
Calcium: 7.5 mg/dL — ABNORMAL LOW (ref 8.9–10.3)
GFR calc non Af Amer: >60 mL/min (ref >60)
GLUCOSE: 153 mg/dL — AB (ref 65–99)
Potassium: 4.3 mmol/L (ref 3.5–5.1)
Sodium: 139 mmol/L (ref 135–145)

## 2015-11-26 LAB — PREPARE FRESH FROZEN PLASMA
UNIT DIVISION: 0
Unit division: 0

## 2015-11-26 LAB — CBC
HEMATOCRIT: 26.1 % — AB (ref 39.0–52.0)
HEMATOCRIT: 25.8 % — AB (ref 39.0–52.0)
HEMOGLOBIN: 8.7 g/dL — AB (ref 13.0–17.0)
Hemoglobin: 8.8 g/dL — ABNORMAL LOW (ref 13.0–17.0)
MCH: 31 pg (ref 26.0–34.0)
MCH: 31.5 pg (ref 26.0–34.0)
MCHC: 33.3 g/dL (ref 30.0–36.0)
MCHC: 34.1 g/dL (ref 30.0–36.0)
MCV: 92.9 fL (ref 78.0–100.0)
MCV: 92.5 fL (ref 78.0–100.0)
Platelets: 124 10*3/uL — ABNORMAL LOW (ref 150–400)
Platelets: 120 10*3/uL — ABNORMAL LOW (ref 150–400)
RBC: 2.81 MIL/uL — ABNORMAL LOW (ref 4.22–5.81)
RBC: 2.79 MIL/uL — ABNORMAL LOW (ref 4.22–5.81)
RDW: 14.9 % (ref 11.5–15.5)
RDW: 15 % (ref 11.5–15.5)
WBC: 10.1 10*3/uL (ref 4.0–10.5)
WBC: 12.8 10*3/uL — ABNORMAL HIGH (ref 4.0–10.5)

## 2015-11-26 LAB — TYPE AND SCREEN
ABO/RH(D): A POS
ANTIBODY SCREEN: NEGATIVE
UNIT DIVISION: 0
UNIT DIVISION: 0

## 2015-11-26 LAB — MAGNESIUM
MAGNESIUM: 2.3 mg/dL (ref 1.7–2.4)
Magnesium: 2.3 mg/dL (ref 1.7–2.4)

## 2015-11-26 LAB — GLUCOSE, CAPILLARY
GLUCOSE-CAPILLARY: 125 mg/dL — AB (ref 65–99)
GLUCOSE-CAPILLARY: 160 mg/dL — AB (ref 65–99)
Glucose-Capillary: 144 mg/dL — ABNORMAL HIGH (ref 65–99)
Glucose-Capillary: 142 mg/dL — ABNORMAL HIGH (ref 65–99)

## 2015-11-26 LAB — POCT I-STAT 3, ART BLOOD GAS (G3+)
Acid-base deficit: 3 mmol/L — ABNORMAL HIGH (ref 0.0–2.0)
Acid-base deficit: 3 mmol/L — ABNORMAL HIGH (ref 0.0–2.0)
BICARBONATE: 21.3 meq/L (ref 20.0–24.0)
Bicarbonate: 22 mEq/L (ref 20.0–24.0)
O2 Saturation: 99 %
O2 Saturation: 99 %
PCO2 ART: 35.5 mmHg (ref 35.0–45.0)
PCO2 ART: 37.1 mmHg (ref 35.0–45.0)
PH ART: 7.377 (ref 7.350–7.450)
PO2 ART: 121 mmHg — AB (ref 80.0–100.0)
Patient temperature: 36.9
TCO2: 22 mmol/L (ref 0–100)
TCO2: 23 mmol/L (ref 0–100)
pH, Arterial: 7.385 (ref 7.350–7.450)
pO2, Arterial: 133 mmHg — ABNORMAL HIGH (ref 80.0–100.0)

## 2015-11-26 LAB — PREPARE PLATELET PHERESIS
UNIT DIVISION: 0
Unit division: 0

## 2015-11-26 LAB — CREATININE, SERUM
Creatinine, Ser: 0.98 mg/dL (ref 0.61–1.24)
GFR calc Af Amer: >60 mL/min (ref >60)

## 2015-11-26 MED ORDER — WARFARIN SODIUM 2.5 MG PO TABS
2.5000 mg | ORAL_TABLET | Freq: Every day | ORAL | Status: DC
  Administered 2015-11-26 – 2015-11-29 (×4): 2.5 mg via ORAL
  Filled 2015-11-26 (×5): qty 1

## 2015-11-26 MED ORDER — METOPROLOL TARTRATE 12.5 MG HALF TABLET
12.5000 mg | ORAL_TABLET | Freq: Two times a day (BID) | ORAL | Status: DC
  Administered 2015-11-27 – 2015-11-29 (×5): 12.5 mg via ORAL
  Filled 2015-11-26 (×6): qty 1

## 2015-11-26 MED ORDER — DOPAMINE-DEXTROSE 3.2-5 MG/ML-% IV SOLN: 0.0000 ug/kg/min | INTRAVENOUS | Status: DC

## 2015-11-26 MED ORDER — MORPHINE SULFATE (PF) 2 MG/ML IV SOLN
1.0000 mg | INTRAVENOUS | Status: DC | PRN
  Administered 2015-11-26 (×2): 1 mg via INTRAVENOUS
  Filled 2015-11-26: qty 1

## 2015-11-26 MED ORDER — WARFARIN - PHYSICIAN DOSING INPATIENT
Freq: Every day | Status: DC
  Administered 2015-11-26: 18:00:00
  Administered 2015-11-28: 1
  Administered 2015-11-29: 18:00:00

## 2015-11-26 MED ORDER — SODIUM CHLORIDE 0.9 % IJ SOLN
10.0000 mL | Freq: Two times a day (BID) | INTRAMUSCULAR | Status: DC
  Administered 2015-11-27: 10 mL

## 2015-11-26 MED ORDER — LACTATED RINGERS IV SOLN
INTRAVENOUS | Status: DC
  Administered 2015-11-27: 10 mL/h via INTRAVENOUS

## 2015-11-26 MED ORDER — METOCLOPRAMIDE HCL 5 MG/ML IJ SOLN
10.0000 mg | Freq: Four times a day (QID) | INTRAMUSCULAR | Status: AC
  Administered 2015-11-26 – 2015-11-27 (×4): 10 mg via INTRAVENOUS
  Filled 2015-11-26 (×4): qty 2

## 2015-11-26 MED ORDER — SODIUM CHLORIDE 0.9 % IJ SOLN
10.0000 mL | INTRAMUSCULAR | Status: DC | PRN
  Administered 2015-11-26: 10 mL
  Filled 2015-11-26: qty 40

## 2015-11-26 NOTE — Plan of Care (Signed)
Problem: Pain Managment: Goal: General experience of comfort will improve Outcome: Progressing Pt instructed on expected post op pain and relief measures with analgesic and splinting pillow. Demonstrates understanding of C/DB exercises, needs reinforcement of explected post op pain.  Problem: Activity: Goal: Risk for activity intolerance will decrease Outcome: Progressing Pt able to dangle bedside within 2 hours of extubation without difficulty.  Problem: Phase II - Intermediate Post-Op Goal: Wean to Extubate Outcome: Completed/Met Date Met:  11/26/15 Pt weaned to extubate after weaning criteria was met. Delayed d/t increased chest tube drainage and poor hemodynamics. Pt extubated after

## 2015-11-26 NOTE — Progress Notes (Signed)
This note also relates to the following rows which could not be included: Dose (mcg/kg/min) Dopamine - Cannot attach notes to extension rows Rate Dopamine - Cannot attach notes to extension rows Concentration Dopamine - Cannot attach notes to extension rows Dose (mcg/min) Phenylephrine  - Cannot attach notes to extension rows Rate Phenylephrine  - Cannot attach notes to extension rows Concentration Phenylephrine  - Cannot attach notes to extension rows   Pt dangle post-extubation, chest tube drainage moderate (PT 590, MT 280) as noted of dark red blood. Pt tolerated dangle without difficulty.

## 2015-11-26 NOTE — Progress Notes (Signed)
[phone]       [place] [address]             [phone]        CARDIOTHORACIC SURGERY PROGRESS NOTE   R1 Day Post-Op Procedure(s) (LRB): CORONARY ARTERY BYPASS GRAFTING (CABG) TIMES THREE USING LEFT INTERNAL MAMMARY ARTERY AND RIGHT SAPHENOUS LEG VEIN HARVESTED ENDOSCOPICALLY (N/A) MITRAL VALVE REPAIR (MVR) (N/A) TRANSESOPHAGEAL ECHOCARDIOGRAM (TEE) (N/A) CLIPPING OF ATRIAL APPENDAGE  Subjective: Intermittently dizzy, but otherwise looks and feels quite well  Objective: Vital signs: BP Readings from Last 1 Encounters:  11/26/15 103/52   Pulse Readings from Last 1 Encounters:  11/26/15 82   Resp Readings from Last 1 Encounters:  11/26/15 18   Temp Readings from Last 1 Encounters:  11/26/15 97.6 F (36.4 C) Oral    Hemodynamics: PAP: (20-34)/(6-20) 25/10 mmHg CO:  [4.3 L/min-6 L/min] 5.6 L/min CI:  [2.3 L/min/m2-3.2 L/min/m2] 3 L/min/m2  Physical Exam:  Rhythm:   sinus  Breath sounds: clear  Heart sounds:  RRR w/out murmur  Incisions:  Dressing dry, intact  Abdomen:  Soft, non-distended, non-tender  Extremities:  Warm, well-perfused  Chest tubes:  Low volume thin serosanguinous output although dumped significant amount w/ mobilization earlier today, no air leak    Intake/Output from previous day: 12/01 0701 - 12/02 0700 In: 9811.7 [P.O.:300; I.V.:5526.7; IK:[id number]; NG/GT:30; IV Piggyback:2290] Out: 6800 [Urine:2750; Blood:2200; Chest Tube:1850] Intake/Output this shift: Total I/O In: 1305.4 [P.O.:180; I.V.:1125.4] Out: 455 [Urine:285; Chest Tube:170]  Lab Results:  CBC: Recent Labs  11/25/15 2035 11/25/15 2037 11/26/15 0427  WBC 14.4*  --  10.1  HGB 6.7* 6.1* 8.7*  HCT 20.6* 18.0* 26.1*  PLT 150  --  124*    BMET:  Recent Labs  11/24/15 0314  11/25/15 2037 11/26/15 0427  NA 138  < > 140 139  K 3.9  < > 4.4 4.3  CL 106  < > 106 108  CO2 25  --   --  23  GLUCOSE 97  < > 140* 153*  BUN 19  < > 19 18    CREATININE 1.09  < > 0.90 0.89  CALCIUM 8.4*  --   --  7.5*  < > = values in this interval not displayed.   PT/INR:   Recent Labs  11/25/15 1530  LABPROT 22.5*  INR 1.99*    CBG (last 3)   Recent Labs  11/26/15 0402 11/26/15 0748 11/26/15 1212  GLUCAP 144* 125* 160*    ABG    Component Value Date/Time   PHART 7.377 11/26/2015 0357   PCO2ART 37.1 11/26/2015 0357   PO2ART 133.0* 11/26/2015 0357   HCO3 22.0 11/26/2015 0357   TCO2 23 11/26/2015 0357   ACIDBASEDEF 3.0* 11/26/2015 0357   O2SAT 99.0 11/26/2015 0357    CXR: PORTABLE CHEST 1 VIEW  COMPARISON: Portable chest x-ray of November 25, 2015.  FINDINGS: The lungs are well-expanded. The interstitial markings are less prominent today. There is subsegmental atelectasis in the right infrahilar region. There is a approximately 5-10% right-sided pneumothorax. The right-sided chest tube has its tip projecting over the lateral aspect of the seventh rib. On the left there is a 5% apical pneumothorax as well. The upper chest tube on the left is unchanged with its tip at approximately the T4 level. The lower chest tube is stable. There is subsegmental atelectasis in the left lower lobe. There is no pleural effusion.  The heart and pulmonary vascularity are  normal. The endotracheal and esophagogastric tubes have been removed. The Swan-Ganz catheter is unchanged with its tip in the proximal right main pulmonary artery. A mediastinal drain is stable with its tip approximately T7.  IMPRESSION: Less than 10% bilateral apical pneumothoraces. These may be new or simply better demonstrated than on yesterday's study due to changes in patient positioning. There is mild subsegmental atelectasis at both lung bases slightly more conspicuous today. The remaining support tubes are in reasonable position.  Critical Value/emergent results were called by telephone at the time of interpretation on 11/26/2015 at 7:49 am to Berenice Primas, RN, who verbally acknowledged these results.   Electronically Signed  By: David Martinique M.D.  On: 11/26/2015 07:52  Assessment/Plan: S/P Procedure(s) (LRB): CORONARY ARTERY BYPASS GRAFTING (CABG) TIMES THREE USING LEFT INTERNAL MAMMARY ARTERY AND RIGHT SAPHENOUS LEG VEIN HARVESTED ENDOSCOPICALLY (N/A) MITRAL VALVE REPAIR (MVR) (N/A) TRANSESOPHAGEAL ECHOCARDIOGRAM (TEE) (N/A) CLIPPING OF ATRIAL APPENDAGE  Overall doing well POD1 Maintaining NSR w/ stable hemodynamics on low dose neo and dopamine for BP support O2 sats 96-97% on RA Expected post op acute blood loss anemia, Hgb 8.7 Acute on chronic diastolic CHF with expected post-op volume excess, stable Post op thrombocytopenia, mild, platelet count 124k   Mobilize  Hold diuretics until BP stable off drips  Leave chest tubes in for now  Start coumadin slowly   Rexene Alberts, MD 11/26/2015 2:23 PM

## 2015-11-26 NOTE — Progress Notes (Signed)
Pt c/o dizziness and nausea with turning head from side to side and/or lifting head off pillow; PA made aware; new orders for Reglan given; will cont. To monitor.  Jack James

## 2015-11-26 NOTE — Procedures (Signed)
NIF (-50) VC (2.3L) obtained with good effort.

## 2015-11-26 NOTE — Procedures (Signed)
Extubation Procedure Note  Patient Details:   Name: Jack James DOB: 1934-07-01 MRN: [id number]   Airway Documentation:  Airway 8 mm (Active)  Secured at (cm) 23 cm 11/25/2015  7:32 PM  Measured From Lips 11/25/2015  7:32 PM  Secured Location Right 11/25/2015  7:32 PM  Secured By Pink Tape 11/25/2015  7:32 PM  Site Condition Dry 11/25/2015  7:32 PM    Evaluation  O2 sats: stable throughout Complications: No apparent complications Patient did tolerate procedure well. Bilateral Breath Sounds: Clear, Diminished Suctioning: Airway Yes  Estill Bamberg 11/26/2015, 12:26 AM

## 2015-11-27 ENCOUNTER — Inpatient Hospital Stay (HOSPITAL_COMMUNITY): Payer: Medicare Other

## 2015-11-27 LAB — GLUCOSE, CAPILLARY
GLUCOSE-CAPILLARY: 123 mg/dL — AB (ref 65–99)
GLUCOSE-CAPILLARY: 125 mg/dL — AB (ref 65–99)
Glucose-Capillary: 127 mg/dL — ABNORMAL HIGH (ref 65–99)
Glucose-Capillary: 129 mg/dL — ABNORMAL HIGH (ref 65–99)

## 2015-11-27 LAB — CBC
HEMATOCRIT: 26 % — AB (ref 39.0–52.0)
Hemoglobin: 8.5 g/dL — ABNORMAL LOW (ref 13.0–17.0)
MCH: 30.6 pg (ref 26.0–34.0)
MCHC: 32.7 g/dL (ref 30.0–36.0)
MCV: 93.5 fL (ref 78.0–100.0)
Platelets: 106 10*3/uL — ABNORMAL LOW (ref 150–400)
RBC: 2.78 MIL/uL — ABNORMAL LOW (ref 4.22–5.81)
RDW: 14.9 % (ref 11.5–15.5)
WBC: 14.3 10*3/uL — ABNORMAL HIGH (ref 4.0–10.5)

## 2015-11-27 LAB — BASIC METABOLIC PANEL
Anion gap: 6 (ref 5–15)
BUN: 29 mg/dL — AB (ref 6–20)
CHLORIDE: 103 mmol/L (ref 101–111)
CO2: 22 mmol/L (ref 22–32)
Calcium: 7.4 mg/dL — ABNORMAL LOW (ref 8.9–10.3)
Creatinine, Ser: 1.18 mg/dL (ref 0.61–1.24)
GFR calc Af Amer: >60 mL/min (ref >60)
GFR calc non Af Amer: 57 mL/min — ABNORMAL LOW (ref >60)
GLUCOSE: 130 mg/dL — AB (ref 65–99)
POTASSIUM: 4.3 mmol/L (ref 3.5–5.1)
Sodium: 131 mmol/L — ABNORMAL LOW (ref 135–145)

## 2015-11-27 LAB — PROTIME-INR
INR: 1.5 — ABNORMAL HIGH (ref 0.00–1.49)
Prothrombin Time: 18.2 seconds — ABNORMAL HIGH (ref 11.6–15.2)

## 2015-11-27 MED ORDER — MOVING RIGHT ALONG BOOK
Freq: Once | Status: AC
  Administered 2015-11-27: 12:00:00
  Filled 2015-11-27: qty 1

## 2015-11-27 MED ORDER — SODIUM CHLORIDE 0.9 % IJ SOLN
3.0000 mL | Freq: Two times a day (BID) | INTRAMUSCULAR | Status: DC
  Administered 2015-11-27 – 2015-12-03 (×12): 3 mL via INTRAVENOUS

## 2015-11-27 MED ORDER — ALPRAZOLAM 0.25 MG PO TABS: 0.2500 mg | ORAL_TABLET | Freq: Four times a day (QID) | ORAL | Status: DC | PRN

## 2015-11-27 MED ORDER — POTASSIUM CHLORIDE CRYS ER 20 MEQ PO TBCR
20.0000 meq | EXTENDED_RELEASE_TABLET | Freq: Every day | ORAL | Status: DC
  Administered 2015-11-28 – 2015-12-03 (×6): 20 meq via ORAL
  Filled 2015-11-27 (×6): qty 1

## 2015-11-27 MED ORDER — ASPIRIN EC 81 MG PO TBEC
81.0000 mg | DELAYED_RELEASE_TABLET | Freq: Every day | ORAL | Status: DC
  Administered 2015-11-28 – 2015-12-02 (×5): 81 mg via ORAL
  Filled 2015-11-27 (×5): qty 1

## 2015-11-27 MED ORDER — MORPHINE SULFATE (PF) 2 MG/ML IV SOLN
2.0000 mg | INTRAVENOUS | Status: DC | PRN
  Administered 2015-11-27: 2 mg via INTRAVENOUS

## 2015-11-27 MED ORDER — SODIUM CHLORIDE 0.9 % IV SOLN: 250.0000 mL | INTRAVENOUS | Status: DC | PRN

## 2015-11-27 MED ORDER — MONTELUKAST SODIUM 10 MG PO TABS
10.0000 mg | ORAL_TABLET | Freq: Every day | ORAL | Status: DC
  Administered 2015-11-27 – 2015-12-02 (×6): 10 mg via ORAL
  Filled 2015-11-27 (×6): qty 1

## 2015-11-27 MED ORDER — AMIODARONE HCL 200 MG PO TABS
200.0000 mg | ORAL_TABLET | Freq: Every day | ORAL | Status: DC
  Administered 2015-11-28: 200 mg via ORAL
  Filled 2015-11-27: qty 1

## 2015-11-27 MED ORDER — FUROSEMIDE 20 MG PO TABS
20.0000 mg | ORAL_TABLET | Freq: Two times a day (BID) | ORAL | Status: DC
  Administered 2015-11-27 – 2015-11-29 (×4): 20 mg via ORAL
  Filled 2015-11-27 (×4): qty 1

## 2015-11-27 MED ORDER — ATORVASTATIN CALCIUM 20 MG PO TABS
20.0000 mg | ORAL_TABLET | Freq: Every day | ORAL | Status: DC
  Administered 2015-11-28 – 2015-12-03 (×6): 20 mg via ORAL
  Filled 2015-11-27 (×6): qty 1

## 2015-11-27 MED ORDER — SODIUM CHLORIDE 0.9 % IJ SOLN: 3.0000 mL | INTRAMUSCULAR | Status: DC | PRN

## 2015-11-27 MED ORDER — MORPHINE SULFATE (PF) 2 MG/ML IV SOLN
INTRAVENOUS | Status: AC
  Filled 2015-11-27: qty 1

## 2015-11-27 MED ORDER — VITAMIN D 1000 UNITS PO TABS
2000.0000 [IU] | ORAL_TABLET | Freq: Every day | ORAL | Status: DC
  Administered 2015-11-28 – 2015-12-03 (×6): 2000 [IU] via ORAL
  Filled 2015-11-27 (×6): qty 2

## 2015-11-27 MED ORDER — ASPIRIN EC 81 MG PO TBEC: 81.0000 mg | DELAYED_RELEASE_TABLET | Freq: Every day | ORAL | Status: DC

## 2015-11-27 NOTE — Progress Notes (Signed)
[facility] BRIEF SICU PROGRESS NOTE  2 Days Post-Op  S/P Procedure(s) (LRB): CORONARY ARTERY BYPASS GRAFTING (CABG) TIMES THREE USING LEFT INTERNAL MAMMARY ARTERY AND RIGHT SAPHENOUS LEG VEIN HARVESTED ENDOSCOPICALLY (N/A) MITRAL VALVE REPAIR (MVR) (N/A) TRANSESOPHAGEAL ECHOCARDIOGRAM (TEE) (N/A) CLIPPING OF ATRIAL APPENDAGE   Stable day  Plan: Awaiting bed on stepdown unit for transfer  Rexene Alberts, MD 11/27/2015 5:15 PM

## 2015-11-27 NOTE — Progress Notes (Addendum)
[address]       [place] [address]             [phone]        CARDIOTHORACIC SURGERY PROGRESS NOTE   R2 Days Post-Op Procedure(s) (LRB): CORONARY ARTERY BYPASS GRAFTING (CABG) TIMES THREE USING LEFT INTERNAL MAMMARY ARTERY AND RIGHT SAPHENOUS LEG VEIN HARVESTED ENDOSCOPICALLY (N/A) MITRAL VALVE REPAIR (MVR) (N/A) TRANSESOPHAGEAL ECHOCARDIOGRAM (TEE) (N/A) CLIPPING OF ATRIAL APPENDAGE  Subjective: Looks good and feels well.  Mild soreness.  Dizziness improved.  Ate some breakfast.  Objective: Vital signs: BP Readings from Last 1 Encounters:  11/27/15 110/66   Pulse Readings from Last 1 Encounters:  11/27/15 91   Resp Readings from Last 1 Encounters:  11/27/15 20   Temp Readings from Last 1 Encounters:  11/27/15 97.6 F (36.4 C) Oral    Hemodynamics:    Physical Exam:  Rhythm:   sinus  Breath sounds: clear  Heart sounds:  RRR w/out murmur  Incisions:  Dressing dry, intact  Abdomen:  Soft, non-distended, non-tender  Extremities:  Warm, well-perfused  Chest tubes:  Thin serosanguinous output, output decreasing, no air leak    Intake/Output from previous day: 12/02 0701 - 12/03 0700 In: 2308.2 [P.O.:780; I.V.:1428.2; IV Piggyback:100] Out: 1805 [Urine:705; Chest Tube:1100] Intake/Output this shift: Total I/O In: 480 [P.O.:480] Out: 180 [Urine:130; Chest Tube:50]  Lab Results:  CBC: Recent Labs  11/26/15 1612 11/27/15 0419  WBC 12.8* 14.3*  HGB 8.8* 8.5*  HCT 25.8* 26.0*  PLT 120* 106*    BMET:  Recent Labs  11/26/15 0427 11/26/15 1612 11/27/15 0419  NA 139  --  131*  K 4.3  --  4.3  CL 108  --  103  CO2 23  --  22  GLUCOSE 153*  --  130*  BUN 18  --  29*  CREATININE 0.89 0.98 1.18  CALCIUM 7.5*  --  7.4*     PT/INR:   Recent Labs  11/27/15 0419  LABPROT 18.2*  INR 1.50*    CBG (last 3)   Recent Labs  11/26/15 2311 11/27/15 0401 11/27/15 0830  GLUCAP 127* 123* 125*    ABG    Component  Value Date/Time   PHART 7.377 11/26/2015 0357   PCO2ART 37.1 11/26/2015 0357   PO2ART 133.0* 11/26/2015 0357   HCO3 22.0 11/26/2015 0357   TCO2 23 11/26/2015 0357   ACIDBASEDEF 3.0* 11/26/2015 0357   O2SAT 99.0 11/26/2015 0357    CXR: PORTABLE CHEST 1 VIEW  COMPARISON: Radiographs 11/25/2015 and 11/26/2015.  FINDINGS: 0558 hours. Interval removal of the Swan-Ganz catheter. Right IJ sheath remains in place. There are bilateral chest tubes and a mediastinal drain. The heart size and mediastinal contours are stable status post CABG and mitral valve surgery. Left-greater-than-right basilar atelectasis and right greater than left biapical pneumothoraces have not significantly changed. There is no mediastinal shift.  IMPRESSION: No significant change in small bilateral pneumothoraces and bibasilar atelectasis.   Electronically Signed  By: Richardean Sale M.D.  On: 11/27/2015 09:44  Assessment/Plan: S/P Procedure(s) (LRB): CORONARY ARTERY BYPASS GRAFTING (CABG) TIMES THREE USING LEFT INTERNAL MAMMARY ARTERY AND RIGHT SAPHENOUS LEG VEIN HARVESTED ENDOSCOPICALLY (N/A) MITRAL VALVE REPAIR (MVR) (N/A) TRANSESOPHAGEAL ECHOCARDIOGRAM (TEE) (N/A) CLIPPING OF ATRIAL APPENDAGE  Doing well POD2 Maintaining NSR w/ stable BP O2 sats 99% on room air Expected post op acute blood loss anemia, Hgb stable 8.5 Expected post op atelectasis, mild Acute on chronic diastolic CHF with expected  post-op volume excess, improved Bilateral pleural effusions - resolved - chest tubes still draining some Post op thrombocytopenia, mild   D/C mediastinal tubes and leave pleural tubes 1 more day  Mobilize  Diuresis  Coumadin and low dose ASA  Transfer step down   Rexene Alberts, MD 11/27/2015 12:01 PM

## 2015-11-28 ENCOUNTER — Inpatient Hospital Stay (HOSPITAL_COMMUNITY): Payer: Medicare Other

## 2015-11-28 LAB — CBC
HCT: 27.4 % — ABNORMAL LOW (ref 39.0–52.0)
HEMOGLOBIN: 8.9 g/dL — AB (ref 13.0–17.0)
MCH: 30.6 pg (ref 26.0–34.0)
MCHC: 32.5 g/dL (ref 30.0–36.0)
MCV: 94.2 fL (ref 78.0–100.0)
Platelets: 122 10*3/uL — ABNORMAL LOW (ref 150–400)
RBC: 2.91 MIL/uL — AB (ref 4.22–5.81)
RDW: 14.3 % (ref 11.5–15.5)
WBC: 15.8 10*3/uL — ABNORMAL HIGH (ref 4.0–10.5)

## 2015-11-28 LAB — BASIC METABOLIC PANEL
ANION GAP: 3 — AB (ref 5–15)
BUN: 29 mg/dL — AB (ref 6–20)
CHLORIDE: 103 mmol/L (ref 101–111)
CO2: 26 mmol/L (ref 22–32)
Calcium: 7.7 mg/dL — ABNORMAL LOW (ref 8.9–10.3)
Creatinine, Ser: 0.97 mg/dL (ref 0.61–1.24)
GFR calc Af Amer: >60 mL/min (ref >60)
GFR calc non Af Amer: >60 mL/min (ref >60)
Glucose, Bld: 115 mg/dL — ABNORMAL HIGH (ref 65–99)
POTASSIUM: 4.7 mmol/L (ref 3.5–5.1)
SODIUM: 132 mmol/L — AB (ref 135–145)

## 2015-11-28 LAB — PROTIME-INR
INR: 1.38 (ref 0.00–1.49)
PROTHROMBIN TIME: 17.1 s — AB (ref 11.6–15.2)

## 2015-11-28 NOTE — Progress Notes (Signed)
[address]       [place] [address]             [phone]        CARDIOTHORACIC SURGERY PROGRESS NOTE   R3 Days Post-Op Procedure(s) (LRB): CORONARY ARTERY BYPASS GRAFTING (CABG) TIMES THREE USING LEFT INTERNAL MAMMARY ARTERY AND RIGHT SAPHENOUS LEG VEIN HARVESTED ENDOSCOPICALLY (N/A) MITRAL VALVE REPAIR (MVR) (N/A) TRANSESOPHAGEAL ECHOCARDIOGRAM (TEE) (N/A) CLIPPING OF ATRIAL APPENDAGE  Subjective: Feels well.  Dizziness resolved.  Nausea persists but improved - at some regular breakfast and had a normal bowel movement.  Looks good.  Objective: Vital signs: BP Readings from Last 1 Encounters:  11/28/15 100/58   Pulse Readings from Last 1 Encounters:  11/28/15 84   Resp Readings from Last 1 Encounters:  11/28/15 13   Temp Readings from Last 1 Encounters:  11/28/15 97.6 F (36.4 C) Oral    Hemodynamics:    Physical Exam:  Rhythm:   sinus  Breath sounds: clear  Heart sounds:  RRR  Incisions:  Dressing dry, intact  Abdomen:  Soft, non-distended, non-tender  Extremities:  Warm, well-perfused  Chest tubes:  Thin serosanguinous output persists,  no air leak    Intake/Output from previous day: 12/03 0701 - 12/04 0700 In: 1370 [P.O.:1320; IV Piggyback:50] Out: [id number] [Urine:710; Chest Tube:820] Intake/Output this shift: Total I/O In: 240 [P.O.:240] Out: 350 [Urine:250; Chest Tube:100]  Lab Results:  CBC: Recent Labs  11/27/15 0419 11/28/15 0230  WBC 14.3* 15.8*  HGB 8.5* 8.9*  HCT 26.0* 27.4*  PLT 106* 122*    BMET:  Recent Labs  11/27/15 0419 11/28/15 0230  NA 131* 132*  K 4.3 4.7  CL 103 103  CO2 22 26  GLUCOSE 130* 115*  BUN 29* 29*  CREATININE 1.18 0.97  CALCIUM 7.4* 7.7*     PT/INR:   Recent Labs  11/28/15 0230  LABPROT 17.1*  INR 1.38    CBG (last 3)   Recent Labs  11/27/15 0401 11/27/15 0830 11/27/15 1136  GLUCAP 123* 125* 129*    ABG    Component Value Date/Time   PHART 7.377 11/26/2015  0357   PCO2ART 37.1 11/26/2015 0357   PO2ART 133.0* 11/26/2015 0357   HCO3 22.0 11/26/2015 0357   TCO2 23 11/26/2015 0357   ACIDBASEDEF 3.0* 11/26/2015 0357   O2SAT 99.0 11/26/2015 0357    CXR: PORTABLE CHEST 1 VIEW  COMPARISON: 11/27/2015 and 11/26/2015.  FINDINGS: 0535 hours. Apparent interval removal of the right IJ sheath and mediastinal drain. Bilateral chest tubes remain in place. The heart size and mediastinal contours are stable. There is persistent left basilar atelectasis and a possible small left pleural effusion. The small right greater than left biapical pneumothoraces are slightly improved.  IMPRESSION: Slight improvement in biapical pneumothoraces. Stable retrocardiac atelectasis.   Electronically Signed  By: Richardean Sale M.D.  On: 11/28/2015 08:05  Assessment/Plan: S/P Procedure(s) (LRB): CORONARY ARTERY BYPASS GRAFTING (CABG) TIMES THREE USING LEFT INTERNAL MAMMARY ARTERY AND RIGHT SAPHENOUS LEG VEIN HARVESTED ENDOSCOPICALLY (N/A) MITRAL VALVE REPAIR (MVR) (N/A) TRANSESOPHAGEAL ECHOCARDIOGRAM (TEE) (N/A) CLIPPING OF ATRIAL APPENDAGE  Doing well POD3 Maintaining NSR w/ stable BP O2 sats 99% on room air Expected post op acute blood loss anemia, Hgb stable 8.9 Expected post op atelectasis, mild Acute on chronic diastolic CHF with expected post-op volume excess, improved Bilateral pleural effusions - chest tubes still draining some Post op thrombocytopenia, improved Leukocytosis, mild   Leave pleural tubes in place and  separate to differentiate drainage  Mobilize  Diuresis  Coumadin and low dose ASA  Awaiting bed for transfer to step down  Rexene Alberts, MD 11/28/2015 10:56 AM

## 2015-11-29 ENCOUNTER — Inpatient Hospital Stay (HOSPITAL_COMMUNITY): Payer: Medicare Other

## 2015-11-29 ENCOUNTER — Other Ambulatory Visit (HOSPITAL_COMMUNITY): Payer: Medicare Other

## 2015-11-29 LAB — GLUCOSE, CAPILLARY: Glucose-Capillary: 156 mg/dL — ABNORMAL HIGH (ref 65–99)

## 2015-11-29 LAB — POCT I-STAT, CHEM 8
BUN: 25 mg/dL — AB (ref 6–20)
CHLORIDE: 104 mmol/L (ref 101–111)
CREATININE: 0.8 mg/dL (ref 0.61–1.24)
Calcium, Ion: 1.11 mmol/L — ABNORMAL LOW (ref 1.13–1.30)
Glucose, Bld: 172 mg/dL — ABNORMAL HIGH (ref 65–99)
HEMATOCRIT: 25 % — AB (ref 39.0–52.0)
Hemoglobin: 8.5 g/dL — ABNORMAL LOW (ref 13.0–17.0)
POTASSIUM: 4.1 mmol/L (ref 3.5–5.1)
Sodium: 137 mmol/L (ref 135–145)
TCO2: 23 mmol/L (ref 0–100)

## 2015-11-29 LAB — CBC
HEMATOCRIT: 27.1 % — AB (ref 39.0–52.0)
HEMOGLOBIN: 9 g/dL — AB (ref 13.0–17.0)
MCH: 31.4 pg (ref 26.0–34.0)
MCHC: 33.2 g/dL (ref 30.0–36.0)
MCV: 94.4 fL (ref 78.0–100.0)
PLATELETS: 134 10*3/uL — AB (ref 150–400)
RBC: 2.87 MIL/uL — AB (ref 4.22–5.81)
RDW: 14.1 % (ref 11.5–15.5)
WBC: 14.1 10*3/uL — ABNORMAL HIGH (ref 4.0–10.5)

## 2015-11-29 LAB — BASIC METABOLIC PANEL
ANION GAP: 5 (ref 5–15)
BUN: 28 mg/dL — ABNORMAL HIGH (ref 6–20)
CO2: 25 mmol/L (ref 22–32)
CREATININE: 0.9 mg/dL (ref 0.61–1.24)
Calcium: 7.7 mg/dL — ABNORMAL LOW (ref 8.9–10.3)
Chloride: 102 mmol/L (ref 101–111)
GFR calc Af Amer: >60 mL/min (ref >60)
GFR calc non Af Amer: >60 mL/min (ref >60)
Glucose, Bld: 118 mg/dL — ABNORMAL HIGH (ref 65–99)
POTASSIUM: 4.7 mmol/L (ref 3.5–5.1)
SODIUM: 132 mmol/L — AB (ref 135–145)

## 2015-11-29 LAB — PROTIME-INR
INR: 1.31 (ref 0.00–1.49)
PROTHROMBIN TIME: 16.4 s — AB (ref 11.6–15.2)

## 2015-11-29 MED ORDER — WARFARIN VIDEO
Freq: Once | Status: AC
  Administered 2015-11-30: 09:00:00

## 2015-11-29 MED ORDER — AMIODARONE HCL 200 MG PO TABS
200.0000 mg | ORAL_TABLET | Freq: Every day | ORAL | Status: DC
  Administered 2015-11-30: 200 mg via ORAL
  Filled 2015-11-29: qty 1

## 2015-11-29 MED ORDER — ENOXAPARIN SODIUM 30 MG/0.3ML ~~LOC~~ SOLN
30.0000 mg | SUBCUTANEOUS | Status: DC
  Administered 2015-11-29 – 2015-11-30 (×2): 30 mg via SUBCUTANEOUS
  Filled 2015-11-29 (×2): qty 0.3

## 2015-11-29 MED ORDER — FUROSEMIDE 40 MG PO TABS
40.0000 mg | ORAL_TABLET | Freq: Two times a day (BID) | ORAL | Status: DC
  Administered 2015-11-29 – 2015-12-02 (×6): 40 mg via ORAL
  Filled 2015-11-29 (×6): qty 1

## 2015-11-29 MED ORDER — METOCLOPRAMIDE HCL 5 MG/ML IJ SOLN
10.0000 mg | Freq: Four times a day (QID) | INTRAMUSCULAR | Status: AC
  Administered 2015-11-29 – 2015-11-30 (×6): 10 mg via INTRAVENOUS
  Filled 2015-11-29 (×6): qty 2

## 2015-11-29 MED FILL — Lidocaine HCl IV Inj 20 MG/ML: INTRAVENOUS | Qty: 5 | Status: AC

## 2015-11-29 MED FILL — Albumin, Human Inj 5%: INTRAVENOUS | Qty: 250 | Status: AC

## 2015-11-29 MED FILL — Heparin Sodium (Porcine) Inj 1000 Unit/ML: INTRAMUSCULAR | Qty: 10 | Status: AC

## 2015-11-29 MED FILL — Mannitol IV Soln 20%: INTRAVENOUS | Qty: 500 | Status: AC

## 2015-11-29 MED FILL — Sodium Bicarbonate IV Soln 8.4%: INTRAVENOUS | Qty: 50 | Status: AC

## 2015-11-29 MED FILL — Electrolyte-R (PH 7.4) Solution: INTRAVENOUS | Qty: 4000 | Status: AC

## 2015-11-29 MED FILL — Sodium Chloride IV Soln 0.9%: INTRAVENOUS | Qty: 4000 | Status: AC

## 2015-11-29 NOTE — Progress Notes (Signed)
Pt ambulated approx. 400 ft with rolling walker and standby assist. Pt tolerated well. Pt back in room and resting. No new complaints. Will continue to monitor.   Delaynie Stetzer, RN

## 2015-11-29 NOTE — Care Management Important Message (Signed)
Important Message  Patient Details  Name: Jack James MRN: [id number] Date of Birth: 1934/02/21   Medicare Important Message Given:  Yes    Nathen May 11/29/2015, 9:26 AM

## 2015-11-29 NOTE — Progress Notes (Addendum)
[address]       [place] [address]             [phone]        CARDIOTHORACIC SURGERY PROGRESS NOTE   R4 Days Post-Op Procedure(s) (LRB): CORONARY ARTERY BYPASS GRAFTING (CABG) TIMES THREE USING LEFT INTERNAL MAMMARY ARTERY AND RIGHT SAPHENOUS LEG VEIN HARVESTED ENDOSCOPICALLY (N/A) MITRAL VALVE REPAIR (MVR) (N/A) TRANSESOPHAGEAL ECHOCARDIOGRAM (TEE) (N/A) CLIPPING OF ATRIAL APPENDAGE  Subjective: Mild nausea and poor appetite.  Disappointed with what he perceives as slow progress.  Some dyspnea with ambulation.  Overall looks very good.  Objective: Vital signs: BP Readings from Last 1 Encounters:  11/29/15 105/73   Pulse Readings from Last 1 Encounters:  11/29/15 95   Resp Readings from Last 1 Encounters:  11/29/15 15   Temp Readings from Last 1 Encounters:  11/29/15 98.4 F (36.9 C) Oral    Hemodynamics:    Physical Exam:  Rhythm:   Sinus - brief episode Afib yesterday evening  Breath sounds: clear  Heart sounds:  RRR w/out murmur  Incisions:  Clean and dry  Abdomen:  Soft, non-distended, non-tender  Extremities:  Warm, well-perfused  Chest tubes:  Decreasing volume thin serosanguinous output but still draining R>L, no air leak    Intake/Output from previous day: 12/04 0701 - 12/05 0700 In: 840 [P.O.:840] Out: 1740 [Urine:900; Chest Tube:840] Intake/Output this shift:    Lab Results:  CBC: Recent Labs  11/28/15 0230 11/29/15 0225  WBC 15.8* 14.1*  HGB 8.9* 9.0*  HCT 27.4* 27.1*  PLT 122* 134*    BMET:  Recent Labs  11/28/15 0230 11/29/15 0225  NA 132* 132*  K 4.7 4.7  CL 103 102  CO2 26 25  GLUCOSE 115* 118*  BUN 29* 28*  CREATININE 0.97 0.90  CALCIUM 7.7* 7.7*     PT/INR:   Recent Labs  11/29/15 0225  LABPROT 16.4*  INR 1.31    CBG (last 3)   Recent Labs  11/27/15 0401 11/27/15 0830 11/27/15 1136  GLUCAP 123* 125* 129*    ABG    Component Value Date/Time   PHART 7.377 11/26/2015  0357   PCO2ART 37.1 11/26/2015 0357   PO2ART 133.0* 11/26/2015 0357   HCO3 22.0 11/26/2015 0357   TCO2 23 11/26/2015 0357   ACIDBASEDEF 3.0* 11/26/2015 0357   O2SAT 99.0 11/26/2015 0357    CXR: PORTABLE CHEST 1 VIEW  COMPARISON: 11/28/2015.  FINDINGS: Bilateral chest tubes in stable position. Mediastinum hilar structures normal. Prior CABG. Left atrial appendage clip again noted. Stable heart size. Persistent left base atelectasis and/or infiltrate. No interim change. Small left pleural effusion cannot be excluded. Stable bilateral tiny apical pneumothoraces noted .  IMPRESSION: 1. Bilateral chest tubes in stable position. Stable bilateral tiny apical pneumothoraces. 2. Stable left lower lobe atelectasis and/or infiltrate. 3. Prior CABG. Heart size stable. No pulmonary venous congestion.   Electronically Signed  By: Marcello Moores Register  On: 11/29/2015 07:16  Assessment/Plan: S/P Procedure(s) (LRB): CORONARY ARTERY BYPASS GRAFTING (CABG) TIMES THREE USING LEFT INTERNAL MAMMARY ARTERY AND RIGHT SAPHENOUS LEG VEIN HARVESTED ENDOSCOPICALLY (N/A) MITRAL VALVE REPAIR (MVR) (N/A) TRANSESOPHAGEAL ECHOCARDIOGRAM (TEE) (N/A) CLIPPING OF ATRIAL APPENDAGE  Doing well POD4 Maintaining NSR w/ stable BP, brief episode rate-controlled Afib yesterday O2 sats 99% on room air Expected post op acute blood loss anemia, Hgb stable 9.0 Expected post op atelectasis, mild Acute on chronic diastolic CHF with expected post-op volume excess, improved Bilateral pleural effusions -  chest tubes still draining some Post op thrombocytopenia, improved Leukocytosis, mild, decreasing   Leave pleural tubes in place until drainage decreased further  Mobilize  Diuresis  Continue metoprolol and amiodarone  Coumadin and low dose ASA  Lovenox for DVT prophylaxis until INR starts to rise  Still awaiting bed for transfer to step down  Rexene Alberts, MD 11/29/2015 8:23 AM

## 2015-11-29 NOTE — Progress Notes (Signed)
Pt transferred to 2W22 via wheelchair on telemetry monitoring. Pt tolerated transfer well and pt's VSS. Pt received by RN.

## 2015-11-29 NOTE — Care Management Note (Signed)
Case Management Note  Patient Details  Name: [name] MRN: [id number] Date of Birth: 1934-05-14  Subjective/Objective:   Patient just returned to chair from two laps around unit.  Sitting in chair ready to eat lunch.  Appears comfortable.  Daughter at  Bedside.  Plan to go home with daughter who will be with him 24/7 on discharge for 7-10 days.                  Action/Plan:   Expected Discharge Date:  12/06/15               Expected Discharge Plan:  Home/Self Care  In-House Referral:     Discharge planning Services  CM Consult  Post Acute Care Choice:    Choice offered to:     DME Arranged:    DME Agency:     HH Arranged:    HH Agency:     Status of Service:  In process, will continue to follow  Medicare Important Message Given:  Yes Date Medicare IM Given:    Medicare IM give by:    Date Additional Medicare IM Given:    Additional Medicare Important Message give by:     If discussed at [place] of Stay Meetings, dates discussed:    Additional Comments:  Vergie Living, RN 11/29/2015, 1:45 PM

## 2015-11-29 NOTE — Discharge Instructions (Addendum)
Information on my medicine - Coumadin   (Warfarin)  This medication education was reviewed with me or my healthcare representative as part of my discharge preparation.  The pharmacist that spoke with me during my hospital stay was:  Dareen Piano, [facility]  Why was Coumadin prescribed for you? Coumadin was prescribed for you because you have a blood clot or a medical condition that can cause an increased risk of forming blood clots. Blood clots can cause serious health problems by blocking the flow of blood to the heart, lung, or brain. Coumadin can prevent harmful blood clots from forming. As a reminder your indication for Coumadin is:   Stroke Prevention Because Of Atrial Fibrillation  What test will check on my response to Coumadin? While on Coumadin (warfarin) you will need to have an INR test regularly to ensure that your dose is keeping you in the desired range. The INR (international normalized ratio) number is calculated from the result of the laboratory test called prothrombin time (PT).  If an INR APPOINTMENT HAS NOT ALREADY BEEN MADE FOR YOU please schedule an appointment to have this lab work done by your health care provider within 7 days. Your INR goal is usually a number between:  2 to 3 or your provider may give you a more narrow range like 2-2.5.  Ask your health care provider during an office visit what your goal INR is.  What  do you need to  know  About  COUMADIN? Take Coumadin (warfarin) exactly as prescribed by your healthcare provider about the same time each day.  DO NOT stop taking without talking to the doctor who prescribed the medication.  Stopping without other blood clot prevention medication to take the place of Coumadin may increase your risk of developing a new clot or stroke.  Get refills before you run out.  What do you do if you miss a dose? If you miss a dose, take it as soon as you remember on the same day then continue your regularly scheduled regimen the  next day.  Do not take two doses of Coumadin at the same time.  Important Safety Information A possible side effect of Coumadin (Warfarin) is an increased risk of bleeding. You should call your healthcare provider right away if you experience any of the following: ? Bleeding from an injury or your nose that does not stop. ? Unusual colored urine (red or dark brown) or unusual colored stools (red or black). ? Unusual bruising for unknown reasons. ? A serious fall or if you hit your head (even if there is no bleeding).  Some foods or medicines interact with Coumadin (warfarin) and might alter your response to warfarin. To help avoid this: ? Eat a balanced diet, maintaining a consistent amount of Vitamin K. ? Notify your provider about major diet changes you plan to make. ? Avoid alcohol or limit your intake to 1 drink for women and 2 drinks for men per day. (1 drink is 5 oz. wine, 12 oz. beer, or 1.5 oz. liquor.)  Make sure that ANY health care provider who prescribes medication for you knows that you are taking Coumadin (warfarin).  Also make sure the healthcare provider who is monitoring your Coumadin knows when you have started a new medication including herbals and non-prescription products.  Coumadin (Warfarin)  Major Drug Interactions  Increased Warfarin Effect Decreased Warfarin Effect  Alcohol (large quantities) Antibiotics (esp. Septra/Bactrim, Flagyl, Cipro) Amiodarone (Cordarone) Aspirin (ASA) Cimetidine (Tagamet) Megestrol (Megace) NSAIDs (ibuprofen,  naproxen, etc.) Piroxicam (Feldene) Propafenone (Rythmol SR) Propranolol (Inderal) Isoniazid (INH) Posaconazole (Noxafil) Barbiturates (Phenobarbital) Carbamazepine (Tegretol) Chlordiazepoxide (Librium) Cholestyramine (Questran) Griseofulvin Oral Contraceptives Rifampin Sucralfate (Carafate) Vitamin K   Coumadin (Warfarin) Major Herbal Interactions  Increased Warfarin Effect Decreased Warfarin Effect   Garlic Ginseng Ginkgo biloba Coenzyme Q10 Green tea St. Johns wort    Coumadin (Warfarin) FOOD Interactions  Eat a consistent number of servings per week of foods HIGH in Vitamin K (1 serving =  cup)  Collards (cooked, or boiled & drained) Kale (cooked, or boiled & drained) Mustard greens (cooked, or boiled & drained) Parsley *serving size only =  cup Spinach (cooked, or boiled & drained) Swiss chard (cooked, or boiled & drained) Turnip greens (cooked, or boiled & drained)  Eat a consistent number of servings per week of foods MEDIUM-HIGH in Vitamin K (1 serving = 1 cup)  Asparagus (cooked, or boiled & drained) Broccoli (cooked, boiled & drained, or raw & chopped) Brussel sprouts (cooked, or boiled & drained) *serving size only =  cup Lettuce, raw (green leaf, endive, romaine) Spinach, raw Turnip greens, raw & chopped   These websites have more information on Coumadin (warfarin):  FailFactory.se; VeganReport.com.au; Endoscopic Saphenous Vein Harvesting, Care After Refer to this sheet in the next few weeks. These instructions provide you with information on caring for yourself after your procedure. Your health care provider may also give you more specific instructions. Your treatment has been planned according to current medical practices, but problems sometimes occur. Call your health care provider if you have any problems or questions after your procedure. HOME CARE INSTRUCTIONS Medicine  Take whatever pain medicine your surgeon prescribes. Follow the directions carefully. Do not take over-the-counter pain medicine unless your surgeon says it is okay. Some pain medicine can cause bleeding problems for several weeks after surgery.  Follow your surgeon's instructions about driving. You will probably not be permitted to drive after heart surgery.  Take any medicines your surgeon prescribes. Any medicines you took before your heart surgery should be checked  with your health care provider before you start taking them again. Wound care  If your surgeon has prescribed an elastic bandage or stocking, ask how long you should wear it.  Check the area around your surgical cuts (incisions) whenever your bandages (dressings) are changed. Look for any redness or swelling.  You will need to return to have the stitches (sutures) or staples taken out. Ask your surgeon when to do that.  Ask your surgeon when you can shower or bathe. Activity  Try to keep your legs raised when you are sitting.  Do any exercises your health care providers have given you. These may include deep breathing exercises, coughing, walking, or other exercises. SEEK MEDICAL CARE IF:  You have any questions about your medicines.  You have more leg pain, especially if your pain medicine stops working.  New or growing bruises develop on your leg.  Your leg swells, feels tight, or becomes red.  You have numbness in your leg. SEEK IMMEDIATE MEDICAL CARE IF:  Your pain gets much worse.  Blood or fluid leaks from any of the incisions.  Your incisions become warm, swollen, or red.  You have chest pain.  You have trouble breathing.  You have a fever.  You have more pain near your leg incision. MAKE SURE YOU:  Understand these instructions.  Will watch your condition.  Will get help right away if you are not doing well or get worse.  This information is not intended to replace advice given to you by your health care provider. Make sure you discuss any questions you have with your health care provider.   Document Released: 08/23/2011 Document Revised: 01/01/2015 Document Reviewed: 08/23/2011 Elsevier Interactive Patient Education 2016 Else[employer]. Coronary Artery Bypass Grafting, Care After These instructions give you information on caring for yourself after your procedure. Your doctor may also give you more specific instructions. Call your doctor if you have any  problems or questions after your procedure.  HOME CARE  Only take medicine as told by your doctor. Take medicines exactly as told. Do not stop taking medicines or start any new medicines without talking to your doctor first.  Take your pulse as told by your doctor.  Do deep breathing as told by your doctor. Use your breathing device (incentive spirometer), if given, to practice deep breathing several times a day. Support your chest with a pillow or your arms when you take deep breaths or cough.  Keep the area clean, dry, and protected where the surgery cuts (incisions) were made. Remove bandages (dressings) only as told by your doctor. If strips were applied to surgical area, do not take them off. They fall off on their own.  Check the surgery area daily for puffiness (swelling), redness, or leaking fluid.  If surgery cuts were made in your legs:  Avoid crossing your legs.  Avoid sitting for long periods of time. Change positions every 30 minutes.  Raise your legs when you are sitting. Place them on pillows.  Wear stockings that help keep blood clots from forming in your legs (compression stockings).  Only take sponge baths until your doctor says it is okay to take showers. Pat the surgery area dry. Do not rub the surgery area with a washcloth or towel. Do not bathe, swim, or use a hot tub until your doctor says it is okay.  Eat foods that are high in fiber. These include raw fruits and vegetables, whole grains, beans, and nuts. Choose lean meats. Avoid canned, processed, and fried foods.  Drink enough fluids to keep your pee (urine) clear or pale yellow.  Weigh yourself every day.  Rest and limit activity as told by your doctor. You may be told to:  Stop any activity if you have chest pain, shortness of breath, changes in heartbeat, or dizziness. Get help right away if this happens.  Move around often for short amounts of time or take short walks as told by your doctor. Gradually  become more active. You may need help to strengthen your muscles and build endurance.  Avoid lifting, pushing, or pulling anything heavier than 10 pounds (4.5 kg) for at least 6 weeks after surgery.  Do not drive until your doctor says it is okay.  Ask your doctor when you can go back to work.  Ask your doctor when you can begin sexual activity again.  Follow up with your doctor as told. GET HELP IF:  You have puffiness, redness, more pain, or fluid draining from the incision site.  You have a fever.  You have puffiness in your ankles or legs.  You have pain in your legs.  You gain 2 or more pounds (0.9 kg) a day.  You feel sick to your stomach (nauseous) or throw up (vomit).  You have watery poop (diarrhea). GET HELP RIGHT AWAY IF:  You have chest pain that goes to your jaw or arms.  You have shortness of breath.  You have  a fast or irregular heartbeat.  You notice a "clicking" in your breastbone when you move.  You have numbness or weakness in your arms or legs.  You feel dizzy or light-headed. MAKE SURE YOU:  Understand these instructions.  Will watch your condition.  Will get help right away if you are not doing well or get worse.   This information is not intended to replace advice given to you by your health care provider. Make sure you discuss any questions you have with your health care provider.   Document Released: 12/16/2013 Document Reviewed: 12/16/2013 Elsevier Interactive Patient Education [employer].

## 2015-11-30 ENCOUNTER — Inpatient Hospital Stay (HOSPITAL_COMMUNITY): Payer: Medicare Other

## 2015-11-30 LAB — PROTIME-INR
INR: 1.43 (ref 0.00–1.49)
Prothrombin Time: 17.5 seconds — ABNORMAL HIGH (ref 11.6–15.2)

## 2015-11-30 LAB — CBC
HEMATOCRIT: 28.1 % — AB (ref 39.0–52.0)
HEMOGLOBIN: 9 g/dL — AB (ref 13.0–17.0)
MCH: 30.6 pg (ref 26.0–34.0)
MCHC: 32 g/dL (ref 30.0–36.0)
MCV: 95.6 fL (ref 78.0–100.0)
Platelets: 190 10*3/uL (ref 150–400)
RBC: 2.94 MIL/uL — ABNORMAL LOW (ref 4.22–5.81)
RDW: 13.8 % (ref 11.5–15.5)
WBC: 10.5 10*3/uL (ref 4.0–10.5)

## 2015-11-30 LAB — BASIC METABOLIC PANEL
ANION GAP: 4 — AB (ref 5–15)
BUN: 28 mg/dL — AB (ref 6–20)
CHLORIDE: 102 mmol/L (ref 101–111)
CO2: 28 mmol/L (ref 22–32)
Calcium: 7.9 mg/dL — ABNORMAL LOW (ref 8.9–10.3)
Creatinine, Ser: 1.01 mg/dL (ref 0.61–1.24)
GFR calc non Af Amer: >60 mL/min (ref >60)
GLUCOSE: 103 mg/dL — AB (ref 65–99)
Potassium: 4.7 mmol/L (ref 3.5–5.1)
Sodium: 134 mmol/L — ABNORMAL LOW (ref 135–145)

## 2015-11-30 MED ORDER — WARFARIN SODIUM 5 MG PO TABS
5.0000 mg | ORAL_TABLET | Freq: Every day | ORAL | Status: DC
  Administered 2015-11-30 – 2015-12-02 (×3): 5 mg via ORAL
  Filled 2015-11-30 (×3): qty 1

## 2015-11-30 MED ORDER — METOPROLOL TARTRATE 25 MG PO TABS
25.0000 mg | ORAL_TABLET | Freq: Two times a day (BID) | ORAL | Status: DC
  Administered 2015-11-30 (×2): 25 mg via ORAL
  Filled 2015-11-30 (×2): qty 1

## 2015-11-30 NOTE — Progress Notes (Signed)
Pt has had about 80 cc's of drainage from left chest tube. Orders were in to d/c the tube. Will notify MD to see if the tube should be removed today. Grant Fontana RN, BSN

## 2015-11-30 NOTE — Progress Notes (Signed)
Was notified by CCMD that pt has converted to a-fib and MD Candyce Churn was notified. Pt has recently converted back to sinus rhythm. VSS. Pt is resting. No new complaints. Will continue to monitor.   Iliza Blankenbeckler, RN

## 2015-11-30 NOTE — Progress Notes (Addendum)
[address]       [place] [address]             [phone]          5 Days Post-Op Procedure(s) (LRB): CORONARY ARTERY BYPASS GRAFTING (CABG) TIMES THREE USING LEFT INTERNAL MAMMARY ARTERY AND RIGHT SAPHENOUS LEG VEIN HARVESTED ENDOSCOPICALLY (N/A) MITRAL VALVE REPAIR (MVR) (N/A) TRANSESOPHAGEAL ECHOCARDIOGRAM (TEE) (N/A) CLIPPING OF ATRIAL APPENDAGE  Subjective: "I'm not feeling too perky today." No specific complaints. Continues to have episodes of AF, asymptomatic.    Objective: Vital signs in last 24 hours: Patient Vitals for the past 24 hrs:  BP Temp Temp src Pulse Resp SpO2 Weight  11/30/15 0502 (!) 103/53 mmHg 98 F (36.7 C) Oral 77 18 97 % 165 lb 2 oz (74.9 kg)  11/29/15 2223 98/62 mmHg - - - - - -  11/29/15 2100 (!) 91/46 mmHg 98.4 F (36.9 C) Oral (!) 111 18 100 % -  11/29/15 1437 115/72 mmHg 97.7 F (36.5 C) Oral 99 19 100 % -  11/29/15 1300 - - - 68 (!) 25 (!) 70 % -  11/29/15 1200 113/65 mmHg - - 89 14 99 % -  11/29/15 1143 - 98 F (36.7 C) Oral - - - -  11/29/15 1100 - - - 94 (!) 22 100 % -  11/29/15 1000 - - - 95 16 98 % -  11/29/15 0900 - - - (!) 101 (!) 21 96 % -   Current Weight  11/30/15 165 lb 2 oz (74.9 kg)  BASELINE WEIGHT:70 kg   Intake/Output from previous day: 12/05 0701 - 12/06 0700 In: 303 [P.O.:300; I.V.:3] Out: 535 [Urine:275; Chest Tube:260]    PHYSICAL EXAM:  Heart: Irr irr, rates 80s Lungs: Crackles in bases Wound: Clean and dry Extremities: R>L LE edema    Lab Results: CBC: Recent Labs  11/29/15 0225 11/30/15 0307  WBC 14.1* 10.5  HGB 9.0* 9.0*  HCT 27.1* 28.1*  PLT 134* 190   BMET:  Recent Labs  11/29/15 0225 11/30/15 0307  NA 132* 134*  K 4.7 4.7  CL 102 102  CO2 25 28  GLUCOSE 118* 103*  BUN 28* 28*  CREATININE 0.90 1.01  CALCIUM 7.7* 7.9*    PT/INR:  Recent Labs  11/30/15 0307  LABPROT 17.5*  INR 1.43      Assessment/Plan: S/P Procedure(s) (LRB): CORONARY  ARTERY BYPASS GRAFTING (CABG) TIMES THREE USING LEFT INTERNAL MAMMARY ARTERY AND RIGHT SAPHENOUS LEG VEIN HARVESTED ENDOSCOPICALLY (N/A) MITRAL VALVE REPAIR (MVR) (N/A) TRANSESOPHAGEAL ECHOCARDIOGRAM (TEE) (N/A) CLIPPING OF ATRIAL APPENDAGE  CV- Continues to have brief episodes AF, SR with PACs. Continue Amio, Coumadin, Lopressor. BPs low normal but stable.  Acute/chronic DHF/Vol overload- continue diuresis.  Bilateral pleural effusions- CT output decreasing, left looks like only about 50 ml out over past 24 hrs, right >200. Possibly can d/c L CT soon.  Deconditioning- Continue CRPI, ambulation.  Pulm- IS/pulm toilet.   LOS: 5 days    COLLINS,GINA H 11/30/2015  I have seen and examined the patient and agree with the assessment and plan as outlined.  Mr Goedert was in atrial flutter this morning w/ HR 120.  Metoprolol increased to 25 bid and attempted RAP at bedside.  He would not convert to sinus but he converted into Afib w/ improved HR control 80-100.  Will d/c left pleural tube and hopefully d/c right pleural tube later today or tomorrow.  Increase coumadin 5  mg tonight.  Rexene Alberts, MD 11/30/2015 11:33 AM

## 2015-11-30 NOTE — Progress Notes (Signed)
CARDIAC REHAB PHASE I   PRE:  Rate/Rhythm: 81 afib  BP:  Supine: 114/62  Sitting:   Standing:    SaO2: 98%RA  MODE:  Ambulation: 550 ft   POST:  Rate/Rhythm: 93-97 afib  BP:  Supine: 127/61  Sitting:   Standing:    SaO2: 96%RA 0924-1007 Pt walked 550 ft on RA with asst x1 and rolling walker. Gait steady,tolerated well. Pt concerned about in and out of atrial fib. Back to bed after walk and bathroom trip due to lack of sleep last night. To walk with staff later.   Graylon Good, RN BSN  11/30/2015 10:02 AM

## 2015-12-01 LAB — PROTIME-INR
INR: 1.61 — AB (ref 0.00–1.49)
Prothrombin Time: 19.2 seconds — ABNORMAL HIGH (ref 11.6–15.2)

## 2015-12-01 MED ORDER — AMIODARONE HCL 200 MG PO TABS
200.0000 mg | ORAL_TABLET | Freq: Two times a day (BID) | ORAL | Status: DC
  Administered 2015-12-01 – 2015-12-02 (×3): 200 mg via ORAL
  Filled 2015-12-01 (×3): qty 1

## 2015-12-01 MED ORDER — ENOXAPARIN SODIUM 30 MG/0.3ML ~~LOC~~ SOLN
30.0000 mg | SUBCUTANEOUS | Status: DC
  Administered 2015-12-02: 30 mg via SUBCUTANEOUS
  Filled 2015-12-01: qty 0.3

## 2015-12-01 MED ORDER — METOPROLOL TARTRATE 25 MG PO TABS: 25.0000 mg | ORAL_TABLET | Freq: Three times a day (TID) | ORAL | Status: DC

## 2015-12-01 MED ORDER — METOPROLOL TARTRATE 50 MG PO TABS
50.0000 mg | ORAL_TABLET | Freq: Two times a day (BID) | ORAL | Status: DC
  Administered 2015-12-01 – 2015-12-03 (×5): 50 mg via ORAL
  Filled 2015-12-01 (×5): qty 1

## 2015-12-01 NOTE — Progress Notes (Addendum)
[address]       [place] [address]             [phone]      6 Days Post-Op Procedure(s) (LRB): CORONARY ARTERY BYPASS GRAFTING (CABG) TIMES THREE USING LEFT INTERNAL MAMMARY ARTERY AND RIGHT SAPHENOUS LEG VEIN HARVESTED ENDOSCOPICALLY (N/A) MITRAL VALVE REPAIR (MVR) (N/A) TRANSESOPHAGEAL ECHOCARDIOGRAM (TEE) (N/A) CLIPPING OF ATRIAL APPENDAGE Subjective: Feels pretty well overall. Appears to have poss accelerated junctional rhythm. Diuresing well   Objective: Vital signs in last 24 hours: Temp:  [97.7 F (36.5 C)-98.6 F (37 C)] 97.9 F (36.6 C) (12/07 0307) Pulse Rate:  [98-116] 98 (12/07 0307) Cardiac Rhythm:  [-] Heart block (12/07 0812) Resp:  [18] 18 (12/07 0307) BP: (100-112)/(58-66) 107/66 mmHg (12/07 0307) SpO2:  [96 %-99 %] 99 % (12/07 0307) Weight:  [160 lb 11.2 oz (72.893 kg)] 160 lb 11.2 oz (72.893 kg) (12/07 0300)  Hemodynamic parameters for last 24 hours:    Intake/Output from previous day: 12/06 0701 - 12/07 0700 In: 480 [P.O.:480] Out: 2626 [Urine:2415; Stool:1; Chest Tube:210] Intake/Output this shift: Total I/O In: -  Out: 20 [Chest Tube:20]  General appearance: alert, cooperative and no distress Heart: regular rate and rhythm and tachy Lungs: clear to auscultation bilaterally Abdomen: benign Extremities: + edema right LE Wound: echymosis right thigh, incis healing well  Lab Results:  Recent Labs  11/29/15 0225 11/30/15 0307  WBC 14.1* 10.5  HGB 9.0* 9.0*  HCT 27.1* 28.1*  PLT 134* 190   BMET:  Recent Labs  11/29/15 0225 11/30/15 0307  NA 132* 134*  K 4.7 4.7  CL 102 102  CO2 25 28  GLUCOSE 118* 103*  BUN 28* 28*  CREATININE 0.90 1.01  CALCIUM 7.7* 7.9*    PT/INR:  Recent Labs  12/01/15 0249  LABPROT 19.2*  INR 1.61*   ABG    Component Value Date/Time   PHART 7.377 11/26/2015 0357   HCO3 22.0 11/26/2015 0357   TCO2 23 11/26/2015 1614   ACIDBASEDEF 3.0* 11/26/2015 0357   O2SAT 99.0  11/26/2015 0357   CBG (last 3)  No results for input(s): GLUCAP in the last 72 hours.  Meds Scheduled Meds: . amiodarone  200 mg Oral Daily  . aspirin EC  81 mg Oral Daily  . atorvastatin  20 mg Oral Daily  . bisacodyl  10 mg Oral Daily   Or  . bisacodyl  10 mg Rectal Daily  . cholecalciferol  2,000 Units Oral Daily  . docusate sodium  200 mg Oral Daily  . enoxaparin (LOVENOX) injection  30 mg Subcutaneous Q24H  . furosemide  40 mg Oral BID  . metoprolol tartrate  25 mg Oral BID  . montelukast  10 mg Oral QHS  . pantoprazole  40 mg Oral Daily  . potassium chloride  20 mEq Oral Daily  . sodium chloride  3 mL Intravenous Q12H  . warfarin  5 mg Oral q1800  . Warfarin - Physician Dosing Inpatient   Does not apply q1800   Continuous Infusions:  PRN Meds:.sodium chloride, ALPRAZolam, metoprolol, ondansetron (ZOFRAN) IV, oxyCODONE, sodium chloride, traMADol  Xrays Dg Chest Port 1 View  11/30/2015  CLINICAL DATA:  Evaluate atelectasis and known pneumothoraces EXAM: PORTABLE CHEST 1 VIEW COMPARISON:  11/29/2015 FINDINGS: Small bilateral apical pneumothoraces stable. Bilateral chest tubes in unchanged position. Right lung is clear. Left atrial appendage clip stable. Status post CABG. Hazy opacity over the left lower lobe suggesting  consolidation and small effusion, unchanged from prior study. IMPRESSION: Stable mild hazy left lower lobe opacification. Stable known apical pneumothoraces. Electronically Signed   By: Skipper Cliche M.D.   On: 11/30/2015 16:59   I/O's - chest tubes 230 cc yesterday, 110 thus far today   Assessment/Plan: S/P Procedure(s) (LRB): CORONARY ARTERY BYPASS GRAFTING (CABG) TIMES THREE USING LEFT INTERNAL MAMMARY ARTERY AND RIGHT SAPHENOUS LEG VEIN HARVESTED ENDOSCOPICALLY (N/A) MITRAL VALVE REPAIR (MVR) (N/A) TRANSESOPHAGEAL ECHOCARDIOGRAM (TEE) (N/A) CLIPPING OF ATRIAL APPENDAGE  1 doing well overall 2 will d/c chest tubes today 3 INR risisng slowly, cont  coumadin, dose increased yesterday 4 atrial tachy/flutter vs  junctional tach - will see if 12 lead helps discern 5 diuresing well on current rx- cont for now  LOS: 6 days    GOLD,WAYNE E 12/01/2015  I have seen and examined the patient and agree with the assessment and plan as outlined.  Remains in atrial flutter vs ectopic atrial tachycardia - confirmed using atrial lead EKG yesterday.  HR trending back up - will increase metoprolol further.  INR trending up - continue coumadin 5 mg today.  D/C chest tubes.  Hopefully ready for d/c home 2-3 days if rhythm under adequate control.  Patient plans to go home with his daughter for convalescence.  Coumadin dosing to be followed by Dr. Wynonia Lawman.  Rexene Alberts, MD 12/01/2015 9:13 AM

## 2015-12-01 NOTE — Progress Notes (Signed)
CARDIAC REHAB PHASE I   PRE:  Rate/Rhythm: 44 SR 1HB ?  BP:  Supine: 92/54  Sitting:   Standing:    SaO2: 99%RA  MODE:  Ambulation: 890 ft   POST:  Rate/Rhythm: 98 ? SR 1HB  BP:  Supine: 107/59  Sitting:   Standing:    SaO2: 94%RA 1310-1345 Pt walked 890 ft with rolling walker with steady gait. Tolerated well. No complaints. To bed after walk. Sats good.   Graylon Good, RN BSN  12/01/2015 1:39 PM

## 2015-12-01 NOTE — Progress Notes (Signed)
Nursing note Patient ambulated 650 feet x1 assist with walker, tolerated well back in room will monitor patient. Sarayah Bacchi, Bettina Gavia RN

## 2015-12-01 NOTE — Progress Notes (Addendum)
Nursing note Patient heart rhythm back to Atrial fib/flutter, on monitor patient rate 85-93 patient asymptomatic will continue to monitor patient. Juan Kissoon, Bettina Gavia RN

## 2015-12-01 NOTE — Progress Notes (Signed)
Left and Right chest tube pulled as ordered and per protocol, dressing applied, also EPW pulled per protocol and as ordered. All ends intact, slight resistance met with right atrial wires, all ends intact. Patient reminded to lie supine approximately one hour. Will continue to monitor patient. BP 105/64 heart rate 92 on monitor . Diron Haddon, Bettina Gavia RN

## 2015-12-02 ENCOUNTER — Inpatient Hospital Stay (HOSPITAL_COMMUNITY): Payer: Medicare Other

## 2015-12-02 LAB — PROTIME-INR
INR: 2.08 — ABNORMAL HIGH (ref 0.00–1.49)
PROTHROMBIN TIME: 23.2 s — AB (ref 11.6–15.2)

## 2015-12-02 MED ORDER — FUROSEMIDE 20 MG PO TABS: 20.0000 mg | ORAL_TABLET | Freq: Every day | ORAL | Status: DC

## 2015-12-02 MED ORDER — AMIODARONE HCL 200 MG PO TABS
200.0000 mg | ORAL_TABLET | Freq: Two times a day (BID) | ORAL | Status: DC
  Administered 2015-12-03: 200 mg via ORAL
  Filled 2015-12-02: qty 1

## 2015-12-02 MED ORDER — FUROSEMIDE 20 MG PO TABS
20.0000 mg | ORAL_TABLET | Freq: Every day | ORAL | Status: DC
  Administered 2015-12-03: 20 mg via ORAL
  Filled 2015-12-02: qty 1

## 2015-12-02 MED ORDER — AMIODARONE HCL 200 MG PO TABS: 200.0000 mg | ORAL_TABLET | Freq: Two times a day (BID) | ORAL | Status: DC

## 2015-12-02 MED ORDER — ASPIRIN EC 81 MG PO TBEC: 81.0000 mg | DELAYED_RELEASE_TABLET | Freq: Every day | ORAL | Status: DC

## 2015-12-02 MED ORDER — ASPIRIN EC 81 MG PO TBEC
81.0000 mg | DELAYED_RELEASE_TABLET | Freq: Every day | ORAL | Status: DC
  Administered 2015-12-03: 81 mg via ORAL
  Filled 2015-12-02: qty 1

## 2015-12-02 NOTE — Progress Notes (Signed)
Subjective:  Discussed with Dr. Roxy Manns this afternoon.  Plans are for discharge in the morning.  Has made good progress.  Appears to be in sinus with some episodes of Wenckebach.  Objective:  Vital Signs in the last 24 hours: BP 96/59 mmHg  Pulse 92  Temp(Src) 98 F (36.7 C) (Oral)  Resp 18  Ht 6\' 2"  (1.88 m)  Wt 71.4 kg (157 lb 6.5 oz)  BMI 20.20 kg/m2  SpO2 99%  Physical Exam: Pleasant bearded male in no acute distress Lungs:  Clear  Cardiac:  Regular rhythm, normal S1 and S2, no S3 Abdomen:  Soft, nontender, no masses Extremities:  No edema present  Intake/Output from previous day: 12/07 0701 - 12/08 0700 In: -  Out: [id number] [Urine:1700; Chest Tube:20] Weight Filed Weights   11/30/15 0502 12/01/15 0300 12/02/15 0351  Weight: 74.9 kg (165 lb 2 oz) 72.893 kg (160 lb 11.2 oz) 71.4 kg (157 lb 6.5 oz)    Lab Results: Basic Metabolic Panel:  Recent Labs  11/30/15 0307  NA 134*  K 4.7  CL 102  CO2 28  GLUCOSE 103*  BUN 28*  CREATININE 1.01    CBC:  Recent Labs  11/30/15 0307  WBC 10.5  HGB 9.0*  HCT 28.1*  MCV 95.6  PLT 190    BNP    Component Value Date/Time   BNP 188.4* 11/19/2015 0832    PROTIME: Lab Results  Component Value Date   INR 2.08* 12/02/2015   INR 1.61* 12/01/2015   INR 1.43 11/30/2015    Telemetry: Currently sinus what appears to be with prolonged first-degree AV block versus atrial tachycardia  Assessment/Plan:  He is doing fine following his mitral valve repair and has had some atrial arrhythmias.  Is discharged home on amiodarone and warfarin.  He should have his pro time drawn Monday at [facility] and have it called to our office at [phone] or fax to [phone].  I would need to see him in 2 weeks.  Maybe they can be coordinated when he is seen at the office for his postop visit.  I would have his pro time checked on at least Monday and Thursday for the time being.     Kerry Hough  MD  [facility]  12/02/2015, 8:04 PM

## 2015-12-02 NOTE — Progress Notes (Signed)
CARDIAC REHAB PHASE I   PRE:  Rate/Rhythm: 102 ST ? 1HB  BP:  Supine: 114/65  Sitting:   Standing:    SaO2: 99%RA  MODE:  Ambulation: 890 ft   POST:  Rate/Rhythm: 114  BP:  Supine: 119/81  Sitting:   Standing:    SaO2: 95%RA 1030-1055 Pt walked 890 ft on RA with rolling walker with steady gait. Second walk today. Tolerated well. To bed.   Graylon Good, RN BSN  12/02/2015 11:04 AM

## 2015-12-02 NOTE — Progress Notes (Addendum)
[address]       [place] [address]             [phone]      7 Days Post-Op Procedure(s) (LRB): CORONARY ARTERY BYPASS GRAFTING (CABG) TIMES THREE USING LEFT INTERNAL MAMMARY ARTERY AND RIGHT SAPHENOUS LEG VEIN HARVESTED ENDOSCOPICALLY (N/A) MITRAL VALVE REPAIR (MVR) (N/A) TRANSESOPHAGEAL ECHOCARDIOGRAM (TEE) (N/A) CLIPPING OF ATRIAL APPENDAGE Subjective: Feels overall pretty well  Objective: Vital signs in last 24 hours: Temp:  [97.4 F (36.3 C)-98.4 F (36.9 C)] 98.4 F (36.9 C) (12/08 0351) Pulse Rate:  [77-116] 92 (12/08 0351) Cardiac Rhythm:  [-] Atrial fibrillation (12/07 1901) Resp:  [18] 18 (12/08 0351) BP: (90-120)/(47-69) 112/64 mmHg (12/08 0351) SpO2:  [96 %-100 %] 96 % (12/08 0351) Weight:  [157 lb 6.5 oz (71.4 kg)] 157 lb 6.5 oz (71.4 kg) (12/08 0351)  Hemodynamic parameters for last 24 hours:    Intake/Output from previous day: 12/07 0701 - 12/08 0700 In: -  Out: [id number] [Urine:1700; Chest Tube:20] Intake/Output this shift:    General appearance: alert, cooperative and no distress Heart: regular rate and rhythm and no murmur Lungs: clear to auscultation bilaterally Abdomen: benign Extremities: minor RLE edema Wound: incis healing well, right leg echymosis is stable  Lab Results:  Recent Labs  11/30/15 0307  WBC 10.5  HGB 9.0*  HCT 28.1*  PLT 190   BMET:  Recent Labs  11/30/15 0307  NA 134*  K 4.7  CL 102  CO2 28  GLUCOSE 103*  BUN 28*  CREATININE 1.01  CALCIUM 7.9*    PT/INR:  Recent Labs  12/02/15 0522  LABPROT 23.2*  INR 2.08*   ABG    Component Value Date/Time   PHART 7.377 11/26/2015 0357   HCO3 22.0 11/26/2015 0357   TCO2 23 11/26/2015 1614   ACIDBASEDEF 3.0* 11/26/2015 0357   O2SAT 99.0 11/26/2015 0357   CBG (last 3)  No results for input(s): GLUCAP in the last 72 hours.  Meds Scheduled Meds: . amiodarone  200 mg Oral BID  . aspirin EC  81 mg Oral Daily  . atorvastatin  20 mg Oral  Daily  . bisacodyl  10 mg Oral Daily   Or  . bisacodyl  10 mg Rectal Daily  . cholecalciferol  2,000 Units Oral Daily  . docusate sodium  200 mg Oral Daily  . enoxaparin (LOVENOX) injection  30 mg Subcutaneous Q24H  . furosemide  40 mg Oral BID  . metoprolol tartrate  50 mg Oral BID  . montelukast  10 mg Oral QHS  . pantoprazole  40 mg Oral Daily  . potassium chloride  20 mEq Oral Daily  . sodium chloride  3 mL Intravenous Q12H  . warfarin  5 mg Oral q1800  . Warfarin - Physician Dosing Inpatient   Does not apply q1800   Continuous Infusions:  PRN Meds:.sodium chloride, ALPRAZolam, metoprolol, ondansetron (ZOFRAN) IV, oxyCODONE, sodium chloride, traMADol  Xrays Dg Chest Port 1 View  11/30/2015  CLINICAL DATA:  Evaluate atelectasis and known pneumothoraces EXAM: PORTABLE CHEST 1 VIEW COMPARISON:  11/29/2015 FINDINGS: Small bilateral apical pneumothoraces stable. Bilateral chest tubes in unchanged position. Right lung is clear. Left atrial appendage clip stable. Status post CABG. Hazy opacity over the left lower lobe suggesting consolidation and small effusion, unchanged from prior study. IMPRESSION: Stable mild hazy left lower lobe opacification. Stable known apical pneumothoraces. Electronically Signed   By: Skipper Cliche M.D.   On:  11/30/2015 16:59    Assessment/Plan: S/P Procedure(s) (LRB): CORONARY ARTERY BYPASS GRAFTING (CABG) TIMES THREE USING LEFT INTERNAL MAMMARY ARTERY AND RIGHT SAPHENOUS LEG VEIN HARVESTED ENDOSCOPICALLY (N/A) MITRAL VALVE REPAIR (MVR) (N/A) TRANSESOPHAGEAL ECHOCARDIOGRAM (TEE) (N/A) CLIPPING OF ATRIAL APPENDAGE  1 conts to progress nicely 2 heart rate is improved, rhythm looks to be unchanged 3 INR conts to increase to therapeutic range on current dose 4 poss d/c in 1-2 days 5 good diuresis, may be able to decrease lasix soon   LOS: 7 days    GOLD,WAYNE E 12/02/2015  I have seen and examined the patient and agree with the assessment and plan as  outlined.  Tentatively plan d/c tomorrow.  Decrease lasix to 20 mg/day for discharge.  Recheck INR in am before deciding on d/c dose for Coumadin.    I have discussed plans for coumadin management with Dr. Wynonia Lawman.  He will follow coumadin and recommends giving the patient a prescription for prothrombin time w/ INR that the patient can take to [facility] in [place] close to where the patient's daughter lives with instructions to fax or call results to Dr. Thurman Coyer office.  Rexene Alberts, MD 12/02/2015 10:59 AM

## 2015-12-02 NOTE — Discharge Summary (Addendum)
[address]       [place] [address]             [phone]              Discharge Summary  Name: Jack James DOB: 03-20-1934 81 y.o. MRN: [id number]   Admission Date: 11/25/2015 Discharge Date:     Admitting Diagnosis: Severe mitral regurgitation Severe 3 vessel coronary artery disease Acute diastolic congestive heart failure Bilateral pleural effusions   Discharge Diagnosis:  Severe mitral regurgitation Severe 3 vessel coronary artery disease Acute diastolic congestive heart failure Bilateral pleural effusions Expected postop blood loss anemia Postoperative atrial flutter  Past Medical History  Diagnosis Date  . Coronary artery disease     a. 2011: DES to proximal LAD  . HLD (hyperlipidemia)   . Mitral valve prolapse   . Chronic diastolic CHF (congestive heart failure) ([place])   . Acute on chronic diastolic heart failure ([place]) 11/19/2015  . Mitral regurgitation 11/19/2015  . S/P mitral valve repair 11/25/2015    Complex valvuloplasty including quadrangular resection of posterior leaflet, artificial Gore-tex neochord placement x10 and 32 mm Sorin Memo 3D Rechord ring annuloplasty   Patient Active Problem List   Diagnosis Date Noted  . S/P mitral valve repair + CABG x3 11/25/2015  . 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 MVP (mitral valve prolapse) 11/19/2015  . Chronic diastolic CHF (congestive heart failure) ([place]) 11/19/2015  . Acute on chronic diastolic heart failure ([place]) 11/19/2015  . Mitral regurgitation 11/19/2015  . Hyperlipidemia   . Pleural effusion, bilateral 11/18/2015  . CAD (coronary artery disease), native coronary artery 08/04/2010    Procedures:  Mitral Valve Repair - 11/25/2015  Complex valvuloplasty including quadrangular resection of posterior leaflet  Artificial Gore-tex Neochord placement x10  Sorin Memo 3D Rechord ring annuloplasty (size 46mm) Coronary Artery Bypass Grafting x 3  Left Internal Mammary  Artery to Distal Left Anterior Descending Coronary Artery  Saphenous Vein Graft to Distal Right Coronary Artery  Saphenous Vein Graft to Second Obtuse Marginal Branch of Left Circumflex Coronary Artery  Endoscopic Vein Harvest from Right Thigh Clipping of Left Atrial Appendage  Atricure Pro Clip (size 45 mm) Drainage of Bilateral Pleural Effusions    HPI:  The patient is a 81 y.o. male with history of mitral valve prolapse, coronary artery disease, and chronic diastolic congestive heart failure who was admitted to the hospital 11/19/2015 with symptoms consistent with unstable angina and acute exacerbation of chronic diastolic congestive heart failure. The patient states that he has been told that he had a heart murmur for many years, as long as he can remember. In 2011, the patient was noted to report occasional symptoms of substernal chest tightness with exertion and his primary care physician noted a change in the character of his murmur and he was referred for cardiology consultation. A stress test was performed demonstrating findings consistent with anterior wall ischemia, and he subsequently underwent diagnostic cardiac catheterization followed by PCI and stenting of the proximal left anterior descending coronary artery using a drug-eluting stent. He has done well clinically since that time and has been followed intermittently by Dr. Wynonia Lawman. Early last week, the patient began to experience worsening symptoms of exertional chest tightness and shortness of breath. Symptoms progressed over several days, prompting presentation to the hospital on 11/19/2015. Serial troponin levels remained minimally elevated to a peak troponin I level of 0.06. BNP level was mildly elevated to 188 on admission  and Chest x-ray revealed interstitial pulmonary edema and moderate effusions consistent with acute exacerbation of congestive heart failure superimposed on COPD. The patient underwent left heart  catheterization by Dr. Angelena Form revealing moderate multivessel coronary artery disease including patent stent in the proximal left anterior descending coronary artery with 50% stenosis of the mid left anterior descending coronary artery beyond the distal end of the stent, 65% proximal stenosis of a large left circumflex coronary artery, and 40-50% stenosis of the mid right coronary artery. Right heart catheterization was not performed. Transthoracic echocardiogram performed 11/20/2015 revealed bileaflet prolapse of the mitral valve with a likely flail segment of the posterior leaflet and severe mitral regurgitation. The patient was referred to Dr. Roxy Manns for surgical consultation and transesophageal echocardiogram was performed, confirming the presence of multiple ruptured chordae tendinae with severe mitral regurgitation. The patient was seen in consultation and plans made for elective mitral valve repair and coronary artery bypass grafting. The patient was started on amiodarone for prophylaxis of atrial fibrillation. The patient was discharged from the hospital with plans to return for surgery within the next week. All risks, benefits and alternatives of surgery were explained in detail, and the patient agreed to proceed.    Hospital Course:  The patient was admitted to [facility] on 11/25/2015. The patient was taken to the operating room and underwent the above procedure.    The postoperative course was initially notable for hypotension requiring inotropic support.  He had an expected postop blood loss anemia, but did not require transfusion.  Blood pressures did stabilize and he was weaned from all pressors. He made steady progress and was transferred from ICU to stepdown on postop day 3.   The patient was volume overloaded and was started on Lasix, to which he was responded well. Chest tubes until drainage was low and chest x-ray was stable. The left tube was removed on postop day 5 and the right on  postop day 6 without difficulty. He developed atrial flutter and an attempt was made to rapid atrial pace him at the bedside, but he was unable to convert to sinus rhythm. He did convert to rate controlled atrial flutter  and was continued on Lopressor, po Amiodarone and Coumadin.   Overall, the patient is progressing well.  He remains in atrial flutter/ectopic atrial tachycardia with a controlled rate. Follow up chest x-ray shows small effusions, which are stable. INR is trending up appropriately.  Blood pressures are well controlled. Incisions are healing well. The patient is ambulating in the halls with assistance and is tolerating a regular diet. We anticipate discharge home in the next 24-48 hours provided no acute changes occur.     Recent vital signs:  Filed Vitals:   12/02/15 2047 12/03/15 0555  BP: 114/55 100/80  Pulse: 95 90  Temp: 98.2 F (36.8 C) 98.7 F (37.1 C)  Resp: 18 20    Recent laboratory studies:  CBC:No results for input(s): WBC, HGB, HCT, PLT in the last 72 hours. BMET:   Recent Labs  12/03/15 0306  NA 133*  K 3.9  CL 101  CO2 29  GLUCOSE 113*  BUN 25*  CREATININE 1.16  CALCIUM 8.0*    PT/INR:   Recent Labs  12/03/15 0306  LABPROT 25.4*  INR 2.34*       Discharge Instructions:  The patient is to refrain from driving, heavy lifting or strenuous activity.  May shower daily and clean incisions with soap and water.  May resume regular diet.  An After Visit Summary was printed and given to the patient.   Medication List    STOP taking these medications        hyoscyamine 0.125 MG tablet  Commonly known as:  LEVSIN, ANASPAZ     nitroGLYCERIN 0.4 MG SL tablet  Commonly known as:  NITROSTAT      TAKE these medications        amiodarone 200 MG tablet  Commonly known as:  PACERONE  Take 1 tablet (200 mg total) by mouth 2 (two) times daily.     aspirin EC 81 MG tablet  Take 81 mg by mouth daily.     atorvastatin 20 MG tablet  Commonly  known as:  LIPITOR  Take 20 mg by mouth daily.     cholecalciferol 1000 UNITS tablet  Commonly known as:  VITAMIN D  Take 2,000 Units by mouth daily.     famotidine 10 MG tablet  Commonly known as:  PEPCID  Take 10 mg by mouth as needed for heartburn or indigestion.     furosemide 20 MG tablet  Commonly known as:  LASIX  Take 1 tablet (20 mg total) by mouth daily.     metoprolol 50 MG tablet  Commonly known as:  LOPRESSOR  Take 1 tablet (50 mg total) by mouth 2 (two) times daily.     montelukast 10 MG tablet  Commonly known as:  SINGULAIR  Take 10 mg by mouth at bedtime.     potassium chloride 10 MEQ tablet  Commonly known as:  K-DUR,KLOR-CON  Take 1 tablet (10 mEq total) by mouth daily.     traMADol 50 MG tablet  Commonly known as:  ULTRAM  Take 1-2 tablets (50-100 mg total) by mouth every 6 (six) hours as needed for moderate pain.     warfarin 2.5 MG tablet  Commonly known as:  COUMADIN  Take 1 tablet (2.5 mg total) by mouth daily at 6 PM.        The patient has been discharged on:  1.Beta Blocker: Yes [ x ]  No [ ]   If No, reason:    2.Ace Inhibitor/ARB: Yes [ ]   No [ x ]  If No, reason: Low normal BPs   3.Statin: Yes [ x ]  No [ ]   If No, reason:    4.Ecasa: Yes [ x ]  No [ ]   If No, reason:  Follow-up Information    Follow up with [facility] On 01/03/2016.   Specialty:  Cardiothoracic Surgery   Why:  Have a chest x-ray at [place] at 12:30 , then see Dr. Guy Sandifer PA at 1:30   Contact information:   [place], [place] [phone]      Follow up with Ezzard Standing, MD.   Specialty:  Cardiology   Why:  Please make an appointment for cardiology follow up in 2 weeks   Contact information:   [address] [place] [address] [phone]       Please follow up.   Why:  Please have bloodwork (PT/INR) at [facility] in [place]  next monday - results to be called to Dr Wynonia Lawman who will be adjusting dosage of coumadin       GOLD,WAYNE E 12/03/2015, 10:10 AM    I have seen and examined the patient and agree with the assessment and plan as outlined.  D/C home with daughter today.  John Giovanni, MD 12/03/2015 10:10 AM

## 2015-12-03 LAB — BASIC METABOLIC PANEL
ANION GAP: 3 — AB (ref 5–15)
BUN: 25 mg/dL — ABNORMAL HIGH (ref 6–20)
CO2: 29 mmol/L (ref 22–32)
Calcium: 8 mg/dL — ABNORMAL LOW (ref 8.9–10.3)
Chloride: 101 mmol/L (ref 101–111)
Creatinine, Ser: 1.16 mg/dL (ref 0.61–1.24)
GFR calc Af Amer: >60 mL/min (ref >60)
GFR calc non Af Amer: 58 mL/min — ABNORMAL LOW (ref >60)
GLUCOSE: 113 mg/dL — AB (ref 65–99)
POTASSIUM: 3.9 mmol/L (ref 3.5–5.1)
Sodium: 133 mmol/L — ABNORMAL LOW (ref 135–145)

## 2015-12-03 LAB — PROTIME-INR
INR: 2.34 — ABNORMAL HIGH (ref 0.00–1.49)
Prothrombin Time: 25.4 seconds — ABNORMAL HIGH (ref 11.6–15.2)

## 2015-12-03 MED ORDER — WARFARIN SODIUM 2.5 MG PO TABS: 2.5000 mg | ORAL_TABLET | Freq: Every day | ORAL | Status: DC

## 2015-12-03 MED ORDER — TRAMADOL HCL 50 MG PO TABS: 50.0000 mg | ORAL_TABLET | Freq: Four times a day (QID) | ORAL | Status: DC | PRN

## 2015-12-03 MED ORDER — POTASSIUM CHLORIDE CRYS ER 10 MEQ PO TBCR: 10.0000 meq | EXTENDED_RELEASE_TABLET | Freq: Every day | ORAL | Status: DC

## 2015-12-03 MED ORDER — METOPROLOL TARTRATE 50 MG PO TABS: 50.0000 mg | ORAL_TABLET | Freq: Two times a day (BID) | ORAL | Status: DC

## 2015-12-03 MED ORDER — AMIODARONE HCL 200 MG PO TABS: 200.0000 mg | ORAL_TABLET | Freq: Two times a day (BID) | ORAL | Status: DC

## 2015-12-03 NOTE — Progress Notes (Signed)
Pt and family educated on importance of follow up appointments and medication compliance.  Education completed pt and family agree to discharge teaching. D/C tele. IV removed. Prescriptions given to pt daughter.

## 2015-12-03 NOTE — Progress Notes (Addendum)
[address]       [place] [address]             [phone]      8 Days Post-Op Procedure(s) (LRB): CORONARY ARTERY BYPASS GRAFTING (CABG) TIMES THREE USING LEFT INTERNAL MAMMARY ARTERY AND RIGHT SAPHENOUS LEG VEIN HARVESTED ENDOSCOPICALLY (N/A) MITRAL VALVE REPAIR (MVR) (N/A) TRANSESOPHAGEAL ECHOCARDIOGRAM (TEE) (N/A) CLIPPING OF ATRIAL APPENDAGE Subjective: He feels well overall  Objective: Vital signs in last 24 hours: Temp:  [98 F (36.7 C)-98.7 F (37.1 C)] 98.7 F (37.1 C) (12/09 0555) Pulse Rate:  [90-99] 90 (12/09 0555) Cardiac Rhythm:  [-] Heart block (12/09 0700) Resp:  [18-20] 20 (12/09 0555) BP: (96-114)/(55-80) 100/80 mmHg (12/09 0555) SpO2:  [97 %-100 %] 97 % (12/09 0555) Weight:  [156 lb 12 oz (71.1 kg)] 156 lb 12 oz (71.1 kg) (12/09 0555)  Hemodynamic parameters for last 24 hours:    Intake/Output from previous day: 12/08 0701 - 12/09 0700 In: 720 [P.O.:720] Out: 1000 [Urine:1000] Intake/Output this shift:    General appearance: alert, cooperative and no distress Heart: regular rate and rhythm and no murmur Lungs: clear to auscultation bilaterally Abdomen: benign Extremities: + RLE edema Wound: incis healing well  Lab Results: No results for input(s): WBC, HGB, HCT, PLT in the last 72 hours. BMET:  Recent Labs  12/03/15 0306  NA 133*  K 3.9  CL 101  CO2 29  GLUCOSE 113*  BUN 25*  CREATININE 1.16  CALCIUM 8.0*    PT/INR:  Recent Labs  12/03/15 0306  LABPROT 25.4*  INR 2.34*   ABG    Component Value Date/Time   PHART 7.377 11/26/2015 0357   HCO3 22.0 11/26/2015 0357   TCO2 23 11/26/2015 1614   ACIDBASEDEF 3.0* 11/26/2015 0357   O2SAT 99.0 11/26/2015 0357   CBG (last 3)  No results for input(s): GLUCAP in the last 72 hours.  Meds Scheduled Meds: . amiodarone  200 mg Oral BID  . aspirin EC  81 mg Oral Daily  . atorvastatin  20 mg Oral Daily  . bisacodyl  10 mg Oral Daily   Or  . bisacodyl  10  mg Rectal Daily  . cholecalciferol  2,000 Units Oral Daily  . docusate sodium  200 mg Oral Daily  . furosemide  20 mg Oral Daily  . metoprolol tartrate  50 mg Oral BID  . montelukast  10 mg Oral QHS  . pantoprazole  40 mg Oral Daily  . potassium chloride  20 mEq Oral Daily  . sodium chloride  3 mL Intravenous Q12H  . warfarin  5 mg Oral q1800  . Warfarin - Physician Dosing Inpatient   Does not apply q1800   Continuous Infusions:  PRN Meds:.sodium chloride, ALPRAZolam, metoprolol, ondansetron (ZOFRAN) IV, oxyCODONE, sodium chloride, traMADol  Xrays Dg Chest 2 View  12/02/2015  CLINICAL DATA:  81 year old male post valve replacement. Subsequent encounter. EXAM: CHEST  2 VIEW COMPARISON:  11/30/2015 and 11/19/2015 chest x-ray. FINDINGS: Chest tubes removed. Bilateral small apical pneumothoraces once again noted (approximately 5-10%). Post CABG, valve replacement and atrial clipping. Heart size within normal limits. Central pulmonary vascular prominence unchanged with small pleural effusions larger on the left. Minimal basal atelectasis. IMPRESSION: Chest tubes removed. Bilateral small apical pneumothoraces once again noted (approximately 5-10%). Post CABG, valve replacement and atrial clipping. Central pulmonary vascular prominence unchanged with small pleural effusions larger on the left. Minimal basal atelectasis. Electronically Signed   By: Remo Lipps  Jeannine Kitten M.D.   On: 12/02/2015 07:54    Assessment/Plan: S/P Procedure(s) (LRB): CORONARY ARTERY BYPASS GRAFTING (CABG) TIMES THREE USING LEFT INTERNAL MAMMARY ARTERY AND RIGHT SAPHENOUS LEG VEIN HARVESTED ENDOSCOPICALLY (N/A) MITRAL VALVE REPAIR (MVR) (N/A) TRANSESOPHAGEAL ECHOCARDIOGRAM (TEE) (N/A) CLIPPING OF ATRIAL APPENDAGE  1 rhythm stable 2 overall looking well 3 INR ok, prob home on 2.5 of coumadin 4 will discuss with MD discharge med dosing   LOS: 8 days   Addendum- D/W Dr Roxy Manns who is comfortable with current med dosing as HR is  well controlled. Will d/c on 2.5 of coumadin GOLD,WAYNE E 12/03/2015  I have seen and examined the patient and agree with the assessment and plan as outlined.  Rexene Alberts, MD 12/03/2015 9:55 AM

## 2015-12-03 NOTE — Progress Notes (Signed)
[id number] Education completed with pt and daughter who voiced understanding. Stressed importance of walking and IS. Discussed watching carbs with A1C at 5.8. Reviewed heart healthy tips. Offered to show discharge video but not interested at this time. Wrote down how to view. Discussed CRP 2 and referring to [facility] program at [place] RN BSN 12/03/2015 10:43 AM

## 2015-12-03 NOTE — Care Management Important Message (Signed)
Important Message  Patient Details  Name: Jack James MRN: [id number] Date of Birth: 1934/06/04   Medicare Important Message Given:  Yes    [name] [name] 12/03/2015, 1:36 PM

## 2015-12-03 NOTE — Care Management Note (Signed)
Case Management Note Previous CM note initiated by Luz Lex RN, CM  Patient Details  Name: Jack James MRN: [id number] Date of Birth: 1934-02-08  Subjective/Objective:   Patient just returned to chair from two laps around unit.  Sitting in chair ready to eat lunch.  Appears comfortable.  Daughter at  Bedside.  Plan to go home with daughter who will be with him 24/7 on discharge for 7-10 days.                  Action/Plan:   Expected Discharge Date:  12/03/15             Expected Discharge Plan:  Home/Self Care  In-House Referral:  NA  Discharge planning Services  CM Consult  Post Acute Care Choice:  NA Choice offered to:  NA  DME Arranged:  N/A DME Agency:  NA  HH Arranged:  NA HH Agency:  NA  Status of Service:  Completed, signed off  Medicare Important Message Given:  Yes Date Medicare IM Given:    Medicare IM give by:    Date Additional Medicare IM Given:    Additional Medicare Important Message give by:     If discussed at [place] of Stay Meetings, dates discussed: 12/02/15   Additional Comments:  12/03/15- pt for d/c home today- no needs identified. Pt to go home with daughter.   Dawayne Patricia, RN 12/03/2015, 10:20 AM

## 2015-12-21 ENCOUNTER — Other Ambulatory Visit: Payer: Self-pay | Admitting: Cardiology

## 2015-12-21 ENCOUNTER — Ambulatory Visit
Admission: RE | Admit: 2015-12-21 | Discharge: 2015-12-21 | Disposition: A | Discharge status: Home / Self Care | Payer: Medicare Other | Source: Ambulatory Visit | Attending: Cardiology | Admitting: Cardiology

## 2015-12-21 DIAGNOSIS — I2510 Atherosclerotic heart disease of native coronary artery without angina pectoris: Secondary | ICD-10-CM

## 2015-12-26 HISTORY — PX: CATARACT EXTRACTION, BILATERAL: SHX1313

## 2015-12-31 ENCOUNTER — Other Ambulatory Visit: Payer: Self-pay | Admitting: Thoracic Surgery (Cardiothoracic Vascular Surgery)

## 2015-12-31 DIAGNOSIS — Z951 Presence of aortocoronary bypass graft: Secondary | ICD-10-CM

## 2016-01-03 ENCOUNTER — Ambulatory Visit: Payer: Self-pay

## 2016-01-10 ENCOUNTER — Ambulatory Visit (INDEPENDENT_AMBULATORY_CARE_PROVIDER_SITE_OTHER): Payer: Self-pay | Admitting: Surgical

## 2016-01-10 ENCOUNTER — Ambulatory Visit
Admission: RE | Admit: 2016-01-10 | Discharge: 2016-01-10 | Disposition: A | Discharge status: Home / Self Care | Payer: Medicare Other | Source: Ambulatory Visit | Attending: Thoracic Surgery (Cardiothoracic Vascular Surgery) | Admitting: Thoracic Surgery (Cardiothoracic Vascular Surgery)

## 2016-01-10 VITALS — BP 128/71 | HR 69 | Resp 20 | Ht 71.0 in | Wt 155.0 lb

## 2016-01-10 DIAGNOSIS — Z951 Presence of aortocoronary bypass graft: Secondary | ICD-10-CM

## 2016-01-10 DIAGNOSIS — Z9889 Other specified postprocedural states: Secondary | ICD-10-CM

## 2016-01-10 NOTE — Patient Instructions (Signed)
Using herbal instructions regarding activity progression and driving protocols.

## 2016-01-10 NOTE — Progress Notes (Signed)
[address]       [place] [address]             [phone]                  Ramie B Pinkerton [place] Medical Record [id number] Date of Birth: January 17, 1934  Referring CM:[id number], Grace Bushy, MD Primary Cardiology: Primary Care:BLOMGREN,PETER F, MD  Chief Complaint:  Follow Up Visit CARDIOTHORACIC SURGERY OPERATIVE NOTE  Date of Procedure:11/25/2015  Preoperative Diagnosis:  Severe Mitral Regurgitation  3-vessel Coronary Artery Disease  Acute Diastolic Congestive Heart Failure  Bilateral Pleural Effusions  Postoperative Diagnosis:Same  Procedure:   Mitral Valve Repair Complex valvuloplasty including quadrangular resection of posterior leaflet Artificial Gore-tex Neochord placement x10 Sorin Memo 3D Rechord ring annuloplasty (size 90mm, catalog [id number], serial [id number])   Coronary Artery Bypass Grafting x 3 Left Internal Mammary Artery to Distal Left Anterior Descending Coronary Artery Saphenous Vein Graft to Distal Right Coronary Artery Saphenous Vein Graft to Second Obtuse Marginal Branch of Left Circumflex Coronary Artery Endoscopic Vein Harvest from Right Thigh   Clipping of Left Atrial Appendage Atricure Pro Clip (size 45 mm)   Drainage of Bilateral Pleural Effusions   Surgeon:Clarence H. Roxy Manns, MD  Assistant:Erin Barrett, PA-C  Anesthesia:Benjamin Jillyn Hidden, MD  Operative Findings:  Fibroelastic deficiency type degenerative disease  Multiple ruptured primary chordae tendinae  Bileaflet prolapse with large flail segment of posterior leaflet  Type II dysfunction with severe mitral regurgitation  Normal LV systolic function  Small caliber but otherwise good quality LIMA conduit for grafting  Good quality SVG conduit for grafting  Good quality target vessels for  grafting  No residual mitral regurgitation after successful mitral valve repair History of Present Illness:     The patient is a 82 year old male status post the above described procedure. He is seen in the office on today's date in routine follow-up. Overall he is doing well. He does have some numbness in the ulnar distribution. He does have some mild chest discomfort which appears incisional in nature. He denies shortness of breath. He denies., Chills or other constitutional symptoms. He is not using pain medicine at this time. He is ambulating at least twice daily for 20 minutes. He does get occasional shortness of breath with inclines. He has been seen in cardiology follow-up. He has not started cardiac rehabilitation yet.        Zubrod Score: At the time of surgery this patient's most appropriate activity status/level should be described as: []     0    Normal activity, no symptoms []     1    Restricted in physical strenuous activity but ambulatory, able to do out light work []     2    Ambulatory and capable of self care, unable to do work activities, up and about                 >50 % of waking hours                                                                                   []     3    Only  limited self care, in bed greater than 50% of waking hours []     4    Completely disabled, no self care, confined to bed or chair []     5    Moribund  History  Smoking status  . Never Smoker   Smokeless tobacco  . Never Used       Allergies  Allergen Reactions  . Sulfa Antibiotics Rash    Per Pt    Current Outpatient Prescriptions  Medication Sig Dispense Refill  . amiodarone (PACERONE) 200 MG tablet Take 1 tablet (200 mg total) by mouth 2 (two) times daily. (Patient taking differently: Take 200 mg by mouth daily. ) 60 tablet 1  . aspirin EC 81 MG tablet Take 81 mg by mouth daily.    Marland Kitchen atorvastatin (LIPITOR) 20 MG tablet Take 20 mg by mouth daily.    . cholecalciferol (VITAMIN D)  1000 UNITS tablet Take 2,000 Units by mouth daily.    . famotidine (PEPCID) 10 MG tablet Take 10 mg by mouth as needed for heartburn or indigestion.    . metoprolol (LOPRESSOR) 50 MG tablet Take 1 tablet (50 mg total) by mouth 2 (two) times daily. (Patient taking differently: Take 25 mg by mouth 2 (two) times daily. ) 60 tablet 1  . montelukast (SINGULAIR) 10 MG tablet Take 10 mg by mouth at bedtime.    . traMADol (ULTRAM) 50 MG tablet Take 1-2 tablets (50-100 mg total) by mouth every 6 (six) hours as needed for moderate pain. 50 tablet 0  . warfarin (COUMADIN) 2.5 MG tablet Take 1 tablet (2.5 mg total) by mouth daily at 6 PM. 100 tablet 1   No current facility-administered medications for this visit.       Physical Exam: BP 128/71 mmHg  Pulse 69  Resp 20  Ht 5\' 11"  (1.803 m)  Wt 155 lb (70.308 kg)  BMI 21.63 kg/m2  SpO2 98%  General appearance: alert, cooperative and no distress Heart: regular rate and rhythm Lungs: clear to auscultation bilaterally Abdomen: Benign Extremities: Trace lower extremity edema Wound: Incisions healing well without evidence of infection  Diagnostic Studies & Laboratory data:         Recent Radiology Findings: Dg Chest 2 View  01/10/2016  CLINICAL DATA:  Cardiac valve repair. EXAM: CHEST  2 VIEW COMPARISON:  12/21/2015 . FINDINGS: Interim removal Swan-Ganz catheter and bilateral chest tubes. Prior CABG and cardiac valve replacement. Left atrial appendage clip. Heart size normal. Low lung volumes with basilar atelectasis. Partially cleared from prior exam. Apical pleural parenchymal thickening with nodularity most likely related scarring. No interim change. Small pleural effusions. No significant pneumothorax. IMPRESSION: 1. Prior CABG and cardiac valve replacement. Interim removal of Swan-Ganz catheter and bilateral chest tubes. 2. Bibasilar atelectasis and/or mild infiltrates improved from prior exam. Small pleural effusions. Electronically Signed   By:  Marcello Moores  Register   On: 01/10/2016 14:13      I have independently reviewed the above radiology findings and reviewed findings  with the patient.  Recent Labs: Lab Results  Component Value Date   WBC 10.5 11/30/2015   HGB 9.0* 11/30/2015   HCT 28.1* 11/30/2015   PLT 190 11/30/2015   GLUCOSE 113* 12/03/2015   CHOL 102 11/20/2015   TRIG 36 11/20/2015   HDL 48 11/20/2015   LDLCALC 47 11/20/2015   ALT 17 11/23/2015   AST 21 11/23/2015   NA 133* 12/03/2015   K 3.9 12/03/2015   CL 101 12/03/2015   CREATININE  1.16 12/03/2015   BUN 25* 12/03/2015   CO2 29 12/03/2015   TSH 1.078 11/23/2015   INR 2.34* 12/03/2015   HGBA1C 5.8* 11/23/2015      Assessment / Plan:  Overall progress is quite good. Encouraged him to increase activities and discuss usual protocols regarding this as well as beginning cardiac rehabilitation. Also discussed driving protocols. Discussed routine range of motion activities with the left arm. We discussed discontinuing amiodarone after current prescription is finished. He is on Coumadin which is being followed by Dr. Wynonia Lawman. We will see again in 2 months.        GOLD,WAYNE E 01/10/2016 3:01 PM

## 2016-01-20 ENCOUNTER — Encounter: Payer: Self-pay | Admitting: *Deleted

## 2016-01-20 DIAGNOSIS — I4891 Unspecified atrial fibrillation: Secondary | ICD-10-CM | POA: Insufficient documentation

## 2016-01-20 DIAGNOSIS — I4892 Unspecified atrial flutter: Secondary | ICD-10-CM

## 2016-01-20 HISTORY — DX: Unspecified atrial fibrillation: I48.91

## 2016-01-20 HISTORY — DX: Unspecified atrial flutter: I48.92

## 2016-02-22 ENCOUNTER — Other Ambulatory Visit: Payer: Self-pay | Admitting: *Deleted

## 2016-02-22 DIAGNOSIS — Z79899 Other long term (current) drug therapy: Secondary | ICD-10-CM

## 2016-02-22 MED ORDER — WARFARIN SODIUM 2.5 MG PO TABS: 2.5000 mg | ORAL_TABLET | Freq: Every day | ORAL | Status: DC

## 2016-03-13 ENCOUNTER — Ambulatory Visit (INDEPENDENT_AMBULATORY_CARE_PROVIDER_SITE_OTHER): Payer: Medicare Other | Admitting: Thoracic Surgery (Cardiothoracic Vascular Surgery)

## 2016-03-13 ENCOUNTER — Encounter: Payer: Self-pay | Admitting: Thoracic Surgery (Cardiothoracic Vascular Surgery)

## 2016-03-13 VITALS — BP 117/66 | HR 69 | Resp 16 | Ht 71.0 in | Wt 155.0 lb

## 2016-03-13 DIAGNOSIS — Z951 Presence of aortocoronary bypass graft: Secondary | ICD-10-CM

## 2016-03-13 DIAGNOSIS — Z9889 Other specified postprocedural states: Secondary | ICD-10-CM | POA: Diagnosis not present

## 2016-03-13 NOTE — Patient Instructions (Signed)
You may stop taking Coumadin (warfarin) if Dr. Wynonia Lawman is in agreement.  Continue all other previous medications without any changes at this time  You may resume unrestricted physical activity without any particular limitations at this time.  Endocarditis is a potentially serious infection of heart valves or inside lining of the heart.  It occurs more commonly in patients with diseased heart valves (such as patient's with aortic or mitral valve disease) and in patients who have undergone heart valve repair or replacement.  Certain surgical and dental procedures may put you at risk, such as dental cleaning, other dental procedures, or any surgery involving the respiratory, urinary, gastrointestinal tract, gallbladder or prostate gland.   To minimize your chances for develooping endocarditis, maintain good oral health and seek prompt medical attention for any infections involving the mouth, teeth, gums, skin or urinary tract.    Always notify your doctor or dentist about your underlying heart valve condition before having any invasive procedures. You will need to take antibiotics before certain procedures, including all routine dental cleanings or other dental procedures.  Your cardiologist or dentist should prescribe these antibiotics for you to be taken ahead of time.

## 2016-03-13 NOTE — Progress Notes (Addendum)
[address]       [place] [address]             [phone]     CARDIOTHORACIC SURGERY OFFICE NOTE  Referring Provider is Jacolyn Reedy, MD PCP is Marylene Land, MD   HPI:  Patient returns to the office today for routine follow-up approximately 3 months status post mitral valve repair, coronary artery bypass grafting 3, and clipping of the left atrial appendage on 11/25/2015 for severe symptomatic primary mitral regurgitation with three-vessel coronary artery disease and acute diastolic congestive heart failure.  His early postoperative recovery was notable for some postoperative atrial flutter. He was discharged from the hospital on amiodarone and warfarin.  Since hospital discharge the patient has done remarkably well. He was last seen in our office on 01/10/2016 at which time he was doing very well.  He has been followed closely by Dr. Wynonia Lawman who has been managing his anticoagulation using warfarin.  He reportedly underwent a routine follow-up echocardiogram in early January, and more recently he underwent an exercise stress test. We do not have reports from these exams but according to the patient both looked quite good. He returns to the office today accompanied by his daughter. He is getting along very well at all and participating in outpatient cardiac rehabilitation program in [place].  He reports no symptoms of exertional shortness of breath. He states that he feels quite good and his exercise tolerance is fairly good. He no longer has any significant soreness in his chest. He states that his chest hurts only when he moves specific directions or tries to lift things. Appetite is good. He is sleeping well at night. Overall he has no complaints.   Current Outpatient Prescriptions  Medication Sig Dispense Refill  . aspirin EC 81 MG tablet Take 81 mg by mouth daily.    Marland Kitchen atorvastatin (LIPITOR) 20 MG tablet Take 20 mg by mouth daily.    . cholecalciferol  (VITAMIN D) 1000 UNITS tablet Take 2,000 Units by mouth daily.    . famotidine (PEPCID) 10 MG tablet Take 10 mg by mouth as needed for heartburn or indigestion.    . metoprolol (LOPRESSOR) 50 MG tablet Take 1 tablet (50 mg total) by mouth 2 (two) times daily. (Patient taking differently: Take 25 mg by mouth 2 (two) times daily. ) 60 tablet 1  . montelukast (SINGULAIR) 10 MG tablet Take 10 mg by mouth at bedtime.    Marland Kitchen warfarin (COUMADIN) 2.5 MG tablet Take 1 tablet (2.5 mg total) by mouth daily at 6 PM. 10 tablet 0   No current facility-administered medications for this visit.      Physical Exam:   BP 117/66 mmHg  Pulse 69  Resp 16  Ht 5\' 11"  (1.803 m)  Wt 155 lb (70.308 kg)  BMI 21.63 kg/m2  SpO2 98%  General:  Well-appearing  Chest:   Clear to auscultation  CV:   Regular rate and rhythm without murmur  Incisions:  Completely healed, sternum is stable  Abdomen:  Soft and nontender  Extremities:  Warm and well-perfused  Diagnostic Tests:  n/a   Impression:  Patient is doing very well approximately 3 months status post mitral valve repair, coronary artery bypass grafting, and clipping of the left atrial appendage.   Plan:  We will request to obtain reports from the follow-up echocardiogram and stress test performed at Dr. Thurman Coyer office. The patient has asked about stopping warfarin. I think  it would be reasonable to stop warfarin at this time as long as Dr. Wynonia Lawman containers concurs since the patient has not had any recurrent arrhythmias.  We have not recommended any changes to the patient's current medications. I encouraged patient to continue to gradually increase his physical activity without any particular limitations at this time.  The patient has been reminded regarding the importance of dental hygiene and the lifelong need for antibiotic prophylaxis for all dental cleanings and other related invasive procedures.  All of his questions have been addressed. The patient will  return for routine follow-up next December, approximately 1 year following his original surgery. During the interim the patient will continue to follow up regularly with Dr. Wynonia Lawman.   I spent in excess of 15 minutes during the conduct of this office consultation and >50% of this time involved direct face-to-face encounter with the patient for counseling and/or coordination of their care.   Valentina Gu. Roxy Manns, MD 03/13/2016 1:37 PM    Addendum:  Report from Exercise treadmill stress test performed 02/15/2016 is reviewed. By report the patient exercised on a standard Bruce protocol into stage III. The step test was stopped due to dyspnea and fatigue. The patient did not experience any chest pain suggestive of angina pectoris. There were no ST segment changes during stress. The test was felt to be clinically and EKG negative for ischemia.  There is no report of an echocardiogram since the patient's surgery. We would recommend routine follow-up echocardiogram at some point in the near future to follow-up his mitral valve repair and establish a new baseline for the patient's left ventricular function.  Rexene Alberts, MD 03/14/2016 4:12 PM

## 2016-03-14 ENCOUNTER — Other Ambulatory Visit: Payer: Self-pay | Admitting: Thoracic Surgery (Cardiothoracic Vascular Surgery)

## 2016-05-29 ENCOUNTER — Other Ambulatory Visit: Payer: Self-pay | Admitting: Cardiology

## 2016-05-29 DIAGNOSIS — R609 Edema, unspecified: Secondary | ICD-10-CM

## 2016-05-30 ENCOUNTER — Ambulatory Visit (HOSPITAL_COMMUNITY)
Admission: RE | Admit: 2016-05-30 | Discharge: 2016-05-30 | Disposition: A | Discharge status: Home / Self Care | Payer: Medicare Other | Source: Ambulatory Visit | Attending: Vascular Surgery | Admitting: Vascular Surgery

## 2016-05-30 DIAGNOSIS — I2510 Atherosclerotic heart disease of native coronary artery without angina pectoris: Secondary | ICD-10-CM | POA: Insufficient documentation

## 2016-05-30 DIAGNOSIS — R609 Edema, unspecified: Secondary | ICD-10-CM | POA: Diagnosis not present

## 2016-05-30 DIAGNOSIS — I5032 Chronic diastolic (congestive) heart failure: Secondary | ICD-10-CM | POA: Diagnosis not present

## 2016-05-30 DIAGNOSIS — E785 Hyperlipidemia, unspecified: Secondary | ICD-10-CM | POA: Diagnosis not present

## 2016-11-28 ENCOUNTER — Other Ambulatory Visit: Payer: Self-pay | Admitting: Ophthalmology

## 2016-11-28 DIAGNOSIS — H532 Diplopia: Secondary | ICD-10-CM

## 2016-12-04 ENCOUNTER — Ambulatory Visit: Payer: Medicare Other | Admitting: Thoracic Surgery (Cardiothoracic Vascular Surgery)

## 2016-12-06 ENCOUNTER — Ambulatory Visit
Admission: RE | Admit: 2016-12-06 | Discharge: 2016-12-06 | Disposition: A | Discharge status: Home / Self Care | Payer: Medicare Other | Source: Ambulatory Visit | Attending: Ophthalmology | Admitting: Ophthalmology

## 2016-12-06 DIAGNOSIS — H532 Diplopia: Secondary | ICD-10-CM

## 2016-12-06 MED ORDER — GADOBENATE DIMEGLUMINE 529 MG/ML IV SOLN
14.0000 mL | Freq: Once | INTRAVENOUS | Status: AC | PRN
  Administered 2016-12-06: 14 mL via INTRAVENOUS

## 2016-12-11 ENCOUNTER — Ambulatory Visit: Payer: Medicare Other | Admitting: Thoracic Surgery (Cardiothoracic Vascular Surgery)

## 2016-12-29 ENCOUNTER — Ambulatory Visit (INDEPENDENT_AMBULATORY_CARE_PROVIDER_SITE_OTHER): Payer: Medicare Other | Admitting: Thoracic Surgery (Cardiothoracic Vascular Surgery)

## 2016-12-29 ENCOUNTER — Encounter: Payer: Self-pay | Admitting: Thoracic Surgery (Cardiothoracic Vascular Surgery)

## 2016-12-29 VITALS — BP 120/63 | HR 65 | Resp 16 | Ht 71.0 in | Wt 155.0 lb

## 2016-12-29 DIAGNOSIS — Z9889 Other specified postprocedural states: Secondary | ICD-10-CM | POA: Diagnosis not present

## 2016-12-29 DIAGNOSIS — Z951 Presence of aortocoronary bypass graft: Secondary | ICD-10-CM | POA: Diagnosis not present

## 2016-12-29 NOTE — Patient Instructions (Signed)
Continue all previous medications without any changes at this time  

## 2016-12-29 NOTE — Progress Notes (Signed)
[address]       [place] [address]             [phone]     CARDIOTHORACIC SURGERY OFFICE NOTE  Referring Provider is Jacolyn Reedy, MD PCP is Marylene Land, MD   HPI:  Patient is an 83 year old gentleman who returns to the office today for routine follow-up approximately one year status post mitral valve repair, coronary artery bypass grafting 3, and clipping of left atrial appendage on 11/25/2015 for severe symptomatic primary mitral regurgitation with three-vessel coronary artery disease and acute diastolic congestive heart failure. His early postoperative recovery was notable for postoperative atrial flutter which resolved. Since hospital discharge she has done remarkably well. He was last seen in our office on 03/13/2016. He underwent routine follow-up echocardiogram at Dr. Thurman Coyer office on 03/20/2016. Echocardiogram revealed intact mitral valve repair with no residual mitral regurgitation. Left ventricular ejection fraction was estimated 50%. Since then he has been seen in follow-up by Dr. Wynonia Lawman on several occasions, most recently last month. The patient has done remarkably well from a cardiac standpoint. He has not had significant exertional shortness of breath or chest discomfort. He has had minimal discomfort in his chest. He has not had any known recurrence of atrial dysrhythmias. He had cataract surgery early this year and then developed some blurry vision. He was seen by an ophthalmologist and ultimately underwent an MRI that reportedly revealed signs of a small stroke. He is no longer taking warfarin and is now taking Plavix.  He has been scheduled for a neurology consultation later this month. Patient reports that he feels quite well. He states that he only gets short of breath with strenuous exertion. He notes that his exercise tolerance and breathing is much improved in comparison with how he felt prior to his surgery. He is quite active  physically. His only other complaints of that of mild unsteadiness of gait.   Current Outpatient Prescriptions  Medication Sig Dispense Refill  . atorvastatin (LIPITOR) 20 MG tablet Take 20 mg by mouth daily.    . cholecalciferol (VITAMIN D) 1000 UNITS tablet Take 2,000 Units by mouth 2 (two) times daily.     . famotidine (PEPCID) 10 MG tablet Take 10 mg by mouth as needed for heartburn or indigestion.    . metoprolol (LOPRESSOR) 50 MG tablet Take 1 tablet (50 mg total) by mouth 2 (two) times daily. (Patient taking differently: Take 25 mg by mouth 2 (two) times daily. ) 60 tablet 1  . montelukast (SINGULAIR) 10 MG tablet Take 10 mg by mouth at bedtime.    Marland Kitchen warfarin (COUMADIN) 2.5 MG tablet Take 1 tablet (2.5 mg total) by mouth daily at 6 PM. 10 tablet 0   No current facility-administered medications for this visit.       Physical Exam:   BP 120/63 (BP Location: Left Arm, Patient Position: Sitting, Cuff Size: Normal)   Pulse 65   Resp 16   Ht 5\' 11"  (1.803 m)   Wt 155 lb (70.3 kg)   SpO2 99% Comment: ON RA  BMI 21.62 kg/m   General:  Well-appearing  Chest:   Clear to auscultation  CV:   Regular rate and rhythm without murmur  Incisions:  Completely healed  Abdomen:  Soft nontender  Extremities:  Warm and well-perfused  Diagnostic Tests:  Results of transthoracic echocardiogram performed 03/20/2016 at Dr. Thurman Coyer office are reviewed. By report there was intact mitral valve repair with  no residual mitral regurgitation. There was mild concentric left ventricular hypertrophy with ejection fraction estimated 50%. Mean gradient across the mitral valve was estimated 3.2 mmHg. No other significant abnormalities were noted.   MRI HEAD AND ORBITS WITHOUT AND WITH CONTRAST  TECHNIQUE: Multiplanar, multiecho pulse sequences of the brain and surrounding structures were obtained without and with intravenous contrast. Multiplanar, multiecho pulse sequences of the orbits and  surrounding structures were obtained including fat saturation techniques, before and after intravenous contrast administration.  CONTRAST:  106mL MULTIHANCE GADOBENATE DIMEGLUMINE 529 MG/ML IV SOLN  COMPARISON:  None available.  FINDINGS: MRI HEAD FINDINGS  Brain: Mild diffuse prominence of the CSF containing spaces is compatible with generalized age-related cerebral atrophy. Mild patchy T2/FLAIR hyperintensity within the periventricular and deep white matter both cerebral hemispheres noted, most likely related to chronic small vessel ischemic disease. Overall, changes are felt to be within normal limits for patient age.  No abnormal foci of restricted diffusion to suggest acute or subacute ischemia. Gray-white matter differentiation maintained. There is a tiny punctate focus of encephalomalacia within the right midbrain (series 9, image 25), likely a small remote lacunar infarct. Small focus of associated susceptibility artifact, compatible with remote hemorrhage (series 7, image 8). No other evidence for chronic infarction. No other evidence for acute or chronic intracranial hemorrhage.  No mass lesion, midline shift, or mass effect. Ventricles are normal in size without evidence for hydrocephalus. Incidental note made of a benign arachnoid cyst within the left middle cranial fossa. No other extra-axial fluid collection. No abnormal enhancement.  Pituitary gland within normal limits. Suprasellar region normal. Midline structures intact and normal.  Vascular: Major intracranial vascular flow voids are well preserved.  Skull and upper cervical spine: Craniocervical junction normal. Visualized upper cervical spine unremarkable. Bone marrow signal intensity normal. No scalp soft tissue abnormality.  MRI ORBITS FINDINGS  Orbits: Dedicated views of the orbits were performed. Globes are symmetric in size with normal caliber. Patient is status post prior cataract  extraction bilaterally. Extra-ocular muscles are symmetric and within normal limits. Optic nerves symmetric and within normal limits. No definite abnormal enhancement, although evaluation somewhat limited due to lack of any fat sat sequence. No abnormality seen at the orbital apices. Cavernous sinus symmetric and within normal limits. Superior ophthalmic veins grossly within normal limits. Lacrimal glands symmetric and grossly unremarkable.  Visualized sinuses: Mild scattered mucosal thickening within the ethmoidal air cells. Paranasal sinuses are otherwise clear. Small focus of susceptibility artifact noted at the distal aspect of the nose, of doubtful clinical significance.  Soft tissues: Periorbital soft tissues demonstrate no acute abnormality.  IMPRESSION: 1. 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 2. Otherwise normal MRI of the brain and orbits for patient age. No other findings to explain patient's symptoms identified.   Electronically Signed   By: Jeannine Boga M.D.   On: 12/06/2016 14:58    Impression:  Patient is doing very well from a cardiac standpoint approximately 1 year following mitral valve repair, coronary artery bypass grafting, and clipping of left atrial appendage.    Plan:  We have not recommended any changes to the patient's current medications. We discussed implications regarding the patient's recent MRI which revealed what appeared to be small lacunar infarct in the right midbrain.  I have suggested that the patient discuss this finding length with his neurologist at his upcoming consultation to discuss whether or not this stroke may explain any of the patient's symptoms, such as  his mild unsteadiness of gait and blurry vision.  We discussed the different types of embolic stroke and the rationale for use of antiplatelet agents such as Plavix versus other anticoagulation such as warfarin. All of his  questions have been addressed.  He will continue to follow-up with Dr. Wynonia Lawman on a regular basis. In the future he will call and return to see [place] as needed.  The patient has been reminded regarding the importance of dental hygiene and the lifelong need for antibiotic prophylaxis for all dental cleanings and other related invasive procedures.    I spent in excess of 15 minutes during the conduct of this office consultation and >50% of this time involved direct face-to-face encounter with the patient for counseling and/or coordination of their care.   Valentina Gu. Roxy Manns, MD 12/29/2016 9:59 AM

## 2017-01-01 ENCOUNTER — Ambulatory Visit: Payer: Medicare Other | Admitting: Thoracic Surgery (Cardiothoracic Vascular Surgery)

## 2017-01-11 ENCOUNTER — Ambulatory Visit: Payer: Self-pay | Admitting: Neurology

## 2017-01-24 ENCOUNTER — Ambulatory Visit: Payer: Self-pay | Admitting: Neurology

## 2017-02-02 ENCOUNTER — Ambulatory Visit (INDEPENDENT_AMBULATORY_CARE_PROVIDER_SITE_OTHER): Payer: Medicare Other | Admitting: Neurology

## 2017-02-02 ENCOUNTER — Encounter: Payer: Self-pay | Admitting: Neurology

## 2017-02-02 VITALS — BP 107/67 | HR 63 | Ht 71.0 in | Wt 163.8 lb

## 2017-02-02 DIAGNOSIS — I639 Cerebral infarction, unspecified: Secondary | ICD-10-CM | POA: Diagnosis not present

## 2017-02-02 NOTE — Progress Notes (Signed)
[facility]    Provider:  Dr Jaynee Eagles Referring Provider: Derinda Late, MD Primary Care Physician:  Marylene Land, MD  CC:  Diplopia and lacunar infarct  HPI:  Jack James is a 83 y.o. male here as a referral from Dr. Sandi Mariscal for diplopia and lacunar infarct. PMHx CAD s/p CABG x 3, mitral regurgitation, diastolic CHF,  Post-operative atrial flutter, Osteoporosis, HTN, HLD, IBS, thyroid disease, He is accompanied by his son who also provides much information. He had catract surgery in July 2017 as well as august 2017. They made adjustments to his glasses. In the middle of November when he started having the double vision. When he would look down is when he had the double vision. If he closed one eye the symptoms resolved and he could read. Onset was acute. The double vision images were on top of each others and at an angle/skewed. He was also having headaches at the onset of symptoms. Headaches were in the temples with pressure but no light or sound sensitivity, fevers, chills, jaw claudication or muscle stiffness. His eyes felt sore. He has been slowly improving, The headaches have resolved. No associated symptoms such as jaw pain or shoulder stiffness or fevers/chills. The double vision is mostly gone. It slowly cleared up.  Was worse when reading or looking down. No other inciting events, no head trauma, no speech difficulties, facial droop, focal weakness, dysarthria or aphasia. No other focal neurologic deficits, associated symptoms, inciting events or modifiable factors.  Reviewed notes, labs and imaging from outside physicians, which showed:  LDL 54 CBC/CMP normal 12/17/2016 with creatinine 0.99 and BUN 21 hgba1c 5.8  Personally reviewed images with patient and son and agree with the following:   Brain: Mild diffuse prominence of the CSF containing spaces is compatible with generalized age-related cerebral atrophy. Mild patchy T2/FLAIR hyperintensity within the  periventricular and deep white matter both cerebral hemispheres noted, most likely related to chronic small vessel ischemic disease. Overall, changes are felt to be within normal limits for patient age.  No abnormal foci of restricted diffusion to suggest acute or subacute ischemia. Gray-white matter differentiation maintained. There is a tiny punctate focus of encephalomalacia within the right midbrain (series 9, image 25), likely a small remote lacunar infarct. Small focus of associated susceptibility artifact, compatible with remote hemorrhage (series 7, image 8). No other evidence for chronic infarction. No other evidence for acute or chronic intracranial hemorrhage.  No mass lesion, midline shift, or mass effect. Ventricles are normal in size without evidence for hydrocephalus. Incidental note made of a benign arachnoid cyst within the left middle cranial fossa. No other extra-axial fluid collection. No abnormal enhancement.  Pituitary gland within normal limits. Suprasellar region normal. Midline structures intact and normal.  Vascular: Major intracranial vascular flow voids are well preserved.  Skull and upper cervical spine: Craniocervical junction normal. Visualized upper cervical spine unremarkable. Bone marrow signal intensity normal. No scalp soft tissue abnormality.  MRI ORBITS FINDINGS  Orbits: Dedicated views of the orbits were performed. Globes are symmetric in size with normal caliber. Patient is status post prior cataract extraction bilaterally. Extra-ocular muscles are symmetric and within normal limits. Optic nerves symmetric and within normal limits. No definite abnormal enhancement, although evaluation somewhat limited due to lack of any fat sat sequence. No abnormality seen at the orbital apices. Cavernous sinus symmetric and within normal limits. Superior ophthalmic veins grossly within normal limits. Lacrimal glands symmetric and grossly  unremarkable.  Visualized sinuses: Mild scattered mucosal thickening  within the ethmoidal air cells. Paranasal sinuses are otherwise clear. Small focus of susceptibility artifact noted at the distal aspect of the nose, of doubtful clinical significance.  Soft tissues: Periorbital soft tissues demonstrate no acute abnormality.  IMPRESSION: 1. 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 2. Otherwise normal MRI of the brain and orbits for patient age. No other findings to explain patient's symptoms identified.    Review of Systems: Patient complains of symptoms per HPI as well as the following symptoms: fatigue, blurred vision, joint pain and swelling. Pertinent negatives per HPI. All others negative.   Social History   Social History  . Marital status: Widowed    Spouse name: N/A  . Number of children: 2  . Years of education: N/A   Occupational History  . Retired    Social History Main Topics  . Smoking status: Never Smoker  . Smokeless tobacco: Never Used  . Alcohol use 1.2 - 1.8 oz/week    2 - 3 Glasses of wine per week  . Drug use: No  . Sexual activity: Not on file   Other Topics Concern  . Not on file   Social History Narrative   Lives at home alone   Right-handed   Caffeine: 2 mugs of decaf coffee per day, green tea    Family History  Problem Relation Age of Onset  . Heart failure Mother   . Stroke Sister     Past Medical History:  Diagnosis Date  . Acute on chronic diastolic heart failure ([place]) 11/19/2015  . Chronic diastolic CHF (congestive heart failure) ([place])   . Coronary artery disease    a. 2011: DES to proximal LAD  . HLD (hyperlipidemia)   . Mitral regurgitation 11/19/2015  . Mitral valve prolapse   . S/P mitral valve repair 11/25/2015   Complex valvuloplasty including quadrangular resection of posterior leaflet, artificial Gore-tex neochord placement x10 and 32 mm Sorin Memo 3D Rechord ring  annuloplasty  . Skin cancer     Past Surgical History:  Procedure Laterality Date  . CARDIAC CATHETERIZATION N/A 11/19/2015   Procedure: Left Heart Cath and Coronary Angiography;  Surgeon: Burnell Blanks, MD;  Location: [facility];  Service: Cardiovascular;  Laterality: N/A;  . CATARACT EXTRACTION, BILATERAL  2017  . CLIPPING OF ATRIAL APPENDAGE  11/25/2015   Procedure: CLIPPING OF ATRIAL APPENDAGE;  Surgeon: Rexene Alberts, MD;  Location: [facility];  Service: Open Heart Surgery;;  . CORONARY ARTERY BYPASS GRAFT N/A 11/25/2015   Procedure: CORONARY ARTERY BYPASS GRAFTING (CABG) TIMES THREE USING LEFT INTERNAL MAMMARY ARTERY AND RIGHT SAPHENOUS LEG VEIN HARVESTED ENDOSCOPICALLY;  Surgeon: Rexene Alberts, MD;  Location: [facility];  Service: Open Heart Surgery;  Laterality: N/A;  . CORONARY STENT PLACEMENT  2011   DES to proximal LAD  . MITRAL VALVE REPAIR N/A 11/25/2015   Procedure: MITRAL VALVE REPAIR (MVR);  Surgeon: Rexene Alberts, MD;  Location: [facility];  Service: Open Heart Surgery;  Laterality: N/A;  . TEE WITHOUT CARDIOVERSION N/A 11/22/2015   Procedure: TRANSESOPHAGEAL ECHOCARDIOGRAM (TEE);  Surgeon: [name], MD;  Location: [facility];  Service: Cardiovascular;  Laterality: N/A;  . TEE WITHOUT CARDIOVERSION N/A 11/25/2015   Procedure: TRANSESOPHAGEAL ECHOCARDIOGRAM (TEE);  Surgeon: Rexene Alberts, MD;  Location: [facility];  Service: Open Heart Surgery;  Laterality: N/A;    Current Outpatient Prescriptions  Medication Sig Dispense Refill  . atorvastatin (LIPITOR) 20 MG tablet Take 20  mg by mouth daily.    . cholecalciferol (VITAMIN D) 1000 UNITS tablet Take 2,000 Units by mouth 2 (two) times daily.     . clopidogrel (PLAVIX) 75 MG tablet     . famotidine (PEPCID) 10 MG tablet Take 10 mg by mouth as needed for heartburn or indigestion.    . hyoscyamine (LEVSIN SL) 0.125 MG SL tablet Place 0.125 mg under the tongue every 6 (six) hours as needed.    . metoprolol  (LOPRESSOR) 50 MG tablet Take 1 tablet (50 mg total) by mouth 2 (two) times daily. (Patient taking differently: Take 25 mg by mouth 2 (two) times daily. ) 60 tablet 1  . montelukast (SINGULAIR) 10 MG tablet Take 10 mg by mouth at bedtime.    Marland Kitchen oseltamivir (TAMIFLU) 75 MG capsule Take 75 mg by mouth daily. X 10 days as precaution due to exposure     No current facility-administered medications for this visit.     Allergies as of 02/02/2017 - Review Complete 02/02/2017  Allergen Reaction Noted  . Sulfa antibiotics Rash 11/26/2015    Vitals: BP 107/67   Pulse 63   Ht 5\' 11"  (1.803 m)   Wt 163 lb 12.8 oz (74.3 kg)   BMI 22.85 kg/m  Last Weight:  Wt Readings from Last 1 Encounters:  02/02/17 163 lb 12.8 oz (74.3 kg)   Last Height:   Ht Readings from Last 1 Encounters:  02/02/17 5\' 11"  (1.803 m)    Physical exam: Exam: Gen: NAD, conversant, well nourised, thin, well groomed                     CV: RRR, no MRG. No Carotid Bruits. No peripheral edema, warm, nontender Eyes: Conjunctivae clear without exudates or hemorrhage  Neuro: Detailed Neurologic Exam  Speech:    Speech is normal; fluent and spontaneous with normal comprehension.  Cognition:    The patient is oriented to person, place, and time;     recent and remote memory intact;     language fluent;     normal attention, concentration,     fund of knowledge Cranial Nerves:    The pupils are equal, round, and reactive to light. The fundi are flat. Visual fields are full to finger confrontation. Extraocular movements are intact. Trigeminal sensation is intact and the muscles of mastication are normal. The face is symmetric. The palate elevates in the midline. Hearing intact. Voice is normal. Shoulder shrug is normal. The tongue has normal motion without fasciculations.   Coordination:    No dysmetria  Gait:    Normal native gait  Motor Observation:    No asymmetry, no atrophy, and no involuntary movements  noted. Tone:    Normal muscle tone.    Posture:    Posture is normal. normal erect    Strength:    Strength is V/V in the upper and lower limbs.      Sensation: intact to LT     Reflex Exam:  DTR's:   Absent AJs.Otherwise deep tendon reflexes in the upper and lower extremities are brisk bilaterally.   Toes:    The toes are downgoing bilaterally.   Clonus:    Clonus is absent.       Assessment/Plan:  83 year old patient with Diplopia acute onset November 2017 most likely cranial nerve 4 by description (symptoms resolved). PMHx CAD s/p CABG x 3, mitral regurgitation, diastolic CHF,  Post-operative atrial flutter 2016, Osteoporosis, HTN, HLD, IBS, thyroid  disease. MRI showed a remote lacunar infarct involving the right aspect of the midbrain. 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 Discussed with son and patient and provided literature on cranial nerve 4 palsy. Will complete stroke workup with CTA head and neck. Will request ophthalmology notes from Dr. Ellie Lunch. Dr. Sandi Mariscal requests that patient see Dr. Leonie Man, will ask for first available follow up.   I had a long d/w patient and son about his remote stroke, risk for recurrent stroke/TIAs, personally independently reviewed imaging studies and stroke evaluation results and answered questions.Continue Plavix for secondary stroke prevention and maintain strict control of hypertension with blood pressure goal below 130/90, diabetes with hemoglobin A1c goal below 6.5% and lipids with LDL cholesterol goal below 70 mg/dL.  I also advised the patient to eat a healthy diet with plenty of whole grains, cereals, fruits and vegetables, exercise regularly and maintain ideal body weight .  Cc: Dr. Glenna Durand, [facility] [address] [place], [place] [address]  Phone [phone] Fax [phone]

## 2017-02-02 NOTE — Patient Instructions (Addendum)
Remember to drink plenty of fluid, eat healthy meals and do not skip any meals. Try to eat protein with a every meal and eat a healthy snack such as fruit or nuts in between meals. Try to keep a regular sleep-wake schedule and try to exercise daily, particularly in the form of walking, 20-30 minutes a day, if you can.   As far as diagnostic testing: cta head and neck  I would like to see you back in 2-3 months, sooner if we need to. Please call us with any interim questions, concerns, problems, updates or refill requests.   Our phone number is [phone]. We also have an after hours call service for urgent matters and there is a physician on-call for urgent questions. For any emergencies you know to call 911 or go to the nearest emergency room

## 2017-02-09 ENCOUNTER — Ambulatory Visit
Admission: RE | Admit: 2017-02-09 | Discharge: 2017-02-09 | Disposition: A | Discharge status: Home / Self Care | Payer: Medicare Other | Source: Ambulatory Visit | Attending: Neurology | Admitting: Neurology

## 2017-02-09 DIAGNOSIS — I639 Cerebral infarction, unspecified: Secondary | ICD-10-CM

## 2017-02-09 MED ORDER — IOPAMIDOL (ISOVUE-370) INJECTION 76%
75.0000 mL | Freq: Once | INTRAVENOUS | Status: AC | PRN
  Administered 2017-02-09: 75 mL via INTRAVENOUS

## 2017-02-12 ENCOUNTER — Telehealth: Payer: Self-pay | Admitting: *Deleted

## 2017-02-12 ENCOUNTER — Telehealth: Payer: Self-pay | Admitting: Neurology

## 2017-02-12 NOTE — Telephone Encounter (Signed)
Jack James with Dr. Deboraha Sprang office is calling to discuss the patient's visit on 02-02-17.

## 2017-02-12 NOTE — Telephone Encounter (Signed)
Per Dr Jaynee Eagles, spoke with patient and informed him that his CT angiography of the head neck are unremarkable for his age.  Reminded him of his appointment with Dr Leonie Man 03/21/17. He inquired if Dr Jaynee Eagles wanted him to keep the appt with Dr Leonie Man. Advised him that she does. He verbalized understanding, appreciation, had no further  questions.

## 2017-02-14 NOTE — Telephone Encounter (Signed)
Sherry Ann/Dr Sandi Mariscal [phone] called to advise the pt has an appt tomorrow with him. She needs to discuss the OV today with RN. Please call asap

## 2017-02-14 NOTE — Telephone Encounter (Signed)
Returned call and spoke to [place] who said that pt was originally scheduled w/ Dr. Leonie Man and was r/s due to weather but pt does not have to see Dr. Leonie Man. Dr. Sandi Mariscal would like pt to keep consistent neuro provider and see Dr. Jaynee Eagles for ongoing follow-ups. Follow-up appt scheduled w/ Dr. Jaynee Eagles 04/11/17 @ 10 am.

## 2017-02-15 NOTE — Telephone Encounter (Signed)
Returned call to pt. Asks if appt could be pushed out a little further. Is willing to see either Dr. Leonie Man or Dr. Jaynee Eagles. Will check his schedule for May and will call him back tomorrow to schedule.

## 2017-02-15 NOTE — Telephone Encounter (Signed)
Please call patient in reference to following up with Dr. Jaynee Eagles and not Dr. Leonie Man.  Please call cell if not available on home number at [phone].  Please call

## 2017-02-16 NOTE — Telephone Encounter (Signed)
Called pt and appt scheduled 05/02/17 w/ Dr. Jaynee Eagles for 3 mo f/u.

## 2017-02-21 ENCOUNTER — Ambulatory Visit: Payer: Self-pay | Admitting: Neurology

## 2017-02-21 NOTE — Telephone Encounter (Signed)
Pt called back today requesting to c/a appt with Dr A and schedule with Dr Leonie Man. I spoke with Dr A, she advised it would be ok. appt schedule for 5/8 with Dr Bronson Curb

## 2017-02-28 ENCOUNTER — Encounter: Payer: Self-pay | Admitting: Podiatry

## 2017-02-28 ENCOUNTER — Ambulatory Visit (INDEPENDENT_AMBULATORY_CARE_PROVIDER_SITE_OTHER): Payer: Medicare Other | Admitting: Podiatry

## 2017-02-28 ENCOUNTER — Ambulatory Visit (INDEPENDENT_AMBULATORY_CARE_PROVIDER_SITE_OTHER): Payer: Medicare Other

## 2017-02-28 VITALS — BP 123/70 | HR 62 | Resp 16 | Ht 71.0 in | Wt 157.0 lb

## 2017-02-28 DIAGNOSIS — M779 Enthesopathy, unspecified: Secondary | ICD-10-CM

## 2017-02-28 DIAGNOSIS — M21622 Bunionette of left foot: Secondary | ICD-10-CM | POA: Diagnosis not present

## 2017-02-28 DIAGNOSIS — L84 Corns and callosities: Secondary | ICD-10-CM

## 2017-02-28 MED ORDER — TRIAMCINOLONE ACETONIDE 10 MG/ML IJ SUSP
10.0000 mg | Freq: Once | INTRAMUSCULAR | Status: AC
  Administered 2017-02-28: 10 mg

## 2017-02-28 NOTE — Progress Notes (Signed)
   Subjective:    Patient ID: Jack James, male    DOB: 1934-04-26, 83 y.o.   MRN: [id number]  HPI Chief Complaint  Patient presents with  . Painful lesion    Left foot; plantar forefoot-below 2nd toe & lateral side-below 5th toe      Review of Systems  All other systems reviewed and are negative.      Objective:   Physical Exam        Assessment & Plan:

## 2017-02-28 NOTE — Progress Notes (Signed)
Subjective:     Patient ID: Jack James, male   DOB: March 30, 1934, 83 y.o.   MRN: [id number]  HPI patient presents with exquisite discomfort on the outside of the fifth metatarsal left foot with fluid buildup and pain when palpated. There is keratotic lesion that's present and it is very painful when pressed   Review of Systems  All other systems reviewed and are negative.      Objective:   Physical Exam  Constitutional: He is oriented to person, place, and time.  Cardiovascular: Intact distal pulses.   Musculoskeletal: Normal range of motion.  Neurological: He is oriented to person, place, and time.  Skin: Skin is warm.  Nursing note and vitals reviewed.  neurovascular status intact muscle strength adequate range of motion within normal limits with patient found to have inflammation fluid around the fifth metatarsal head left with pain when palpated and keratotic lesion formation with bone prominence. Patient's found have good digital perfusion and is well oriented 3     Assessment:     Inflammatory capsulitis fifth MPJ lateral side with keratotic lesion formation structural deformity of the bone with pressure against the metatarsal    Plan:     H&P x-rays and condition reviewed. At this time I did a careful capsular injection left hip MPJ 3 mg X been some Kenalog 5 mg Xylocaine did deep debridement of lesion and discussed possibility for fifth metatarsal head resection or osteotomy in future area reappoint when symptomatic again  X-ray report indicates there is enlargement around the fifth metatarsal left foot

## 2017-03-21 ENCOUNTER — Ambulatory Visit: Payer: Medicare Other | Admitting: Neurology

## 2017-04-05 ENCOUNTER — Ambulatory Visit: Payer: Medicare Other | Admitting: Neurology

## 2017-04-11 ENCOUNTER — Ambulatory Visit: Payer: Self-pay | Admitting: Neurology

## 2017-05-01 ENCOUNTER — Encounter (INDEPENDENT_AMBULATORY_CARE_PROVIDER_SITE_OTHER): Payer: Self-pay

## 2017-05-01 ENCOUNTER — Ambulatory Visit (INDEPENDENT_AMBULATORY_CARE_PROVIDER_SITE_OTHER): Payer: Medicare Other | Admitting: Neurology

## 2017-05-01 ENCOUNTER — Encounter: Payer: Self-pay | Admitting: Neurology

## 2017-05-01 VITALS — BP 102/57 | HR 55 | Ht 71.0 in | Wt 161.2 lb

## 2017-05-01 DIAGNOSIS — I639 Cerebral infarction, unspecified: Secondary | ICD-10-CM

## 2017-05-01 DIAGNOSIS — H532 Diplopia: Secondary | ICD-10-CM

## 2017-05-01 DIAGNOSIS — I6381 Other cerebral infarction due to occlusion or stenosis of small artery: Secondary | ICD-10-CM

## 2017-05-01 NOTE — Progress Notes (Signed)
[facility] [address] [place]. [place] [address] [phone]       OFFICE FOLLOW-UP NOTE  [name] Date of Birth:  Jan 10, 1934 Medical Record Number:  [id number]   HPI: Mr Jack James is a pleasant 84 year old Caucasian male who is seen today for follow-up after initial consultation visit with Dr.Ahern and on 02/02/17. He states that in mid November 2017 and developed sudden onset of vertical diplopia. When he looked down this was more prominent. This was binocular and he had to close 1 eye to read. He had some gait imbalance also at that time but he denied any significant headache or blurred vision and nausea. He had no extremity weakness numbness. He initiates and eye doctor eventually saw his primary physician and subsequently had an outpatient MRI scan of the brain done on 12/06/2016 which I personally reviewed shows a right cerebral peduncle hyperintensity compatible with remote age infarct. Patient had previously prior to cardiac surgery in November 2016 had transthoracic echo, transesophageal echo as well as carotid Dopplers which were normal. Dr. Lavell Anchors ordered CT angiogram of the brain and neck which was done on 02/09/17 and I have reviewed those films personally and show no significant intracranial or extracranial stenosis. He states he does have lipid profile checked regularly by his primary physician but I do not have those results but he does have an upcoming appointment next month for annual physical and will get lab work checked at that visit. He states is tolerating Plavix well but does bruise easily. He is had no bleeding episodes. His blood pressure is well controlled and today it is 107/57. His tolerating Lipitor well but does complain of muscle cramps and mild muscle aches. He has had no recurrent stroke or TIA symptoms and his diplopia cleared completely within few weeks.  ROS:   14 system review of systems is positive for  eye itching, and decreased  hearing, snoring, allergies, easy bruising and bleeding and all other systems negative PMH:  Past Medical History:  Diagnosis Date  . Acute on chronic diastolic heart failure ([place]) 11/19/2015  . Chronic diastolic CHF (congestive heart failure) ([place])   . Coronary artery disease    a. 2011: DES to proximal LAD  . HLD (hyperlipidemia)   . Mitral regurgitation 11/19/2015  . Mitral valve prolapse   . S/P mitral valve repair 11/25/2015   Complex valvuloplasty including quadrangular resection of posterior leaflet, artificial Gore-tex neochord placement x10 and 32 mm Sorin Memo 3D Rechord ring annuloplasty  . Skin cancer   . Stroke [facility])     Social History:  Social History   Social History  . Marital status: Widowed    Spouse name: N/A  . Number of children: 2  . Years of education: N/A   Occupational History  . Retired    Social History Main Topics  . Smoking status: Never Smoker  . Smokeless tobacco: Never Used  . Alcohol use 1.2 - 1.8 oz/week    2 - 3 Glasses of wine per week  . Drug use: No  . Sexual activity: Not on file   Other Topics Concern  . Not on file   Social History Narrative   Lives at home alone   Right-handed   Caffeine: 2 mugs of decaf coffee per day, green tea    Medications:   Current Outpatient Prescriptions on File Prior to Visit  Medication Sig Dispense Refill  . atorvastatin (LIPITOR) 20 MG tablet Take 20 mg by mouth daily.    Marland Kitchen  cholecalciferol (VITAMIN D) 1000 UNITS tablet Take 2,000 Units by mouth 2 (two) times daily.     . clopidogrel (PLAVIX) 75 MG tablet     . famotidine (PEPCID) 10 MG tablet Take 10 mg by mouth as needed for heartburn or indigestion.    . hyoscyamine (LEVSIN SL) 0.125 MG SL tablet Place 0.125 mg under the tongue every 6 (six) hours as needed.    . montelukast (SINGULAIR) 10 MG tablet Take 10 mg by mouth at bedtime.     No current facility-administered medications on file prior to visit.     Allergies:   Allergies    Allergen Reactions  . Sulfa Antibiotics Rash    Per Pt    Physical Exam General: well developed, well nourished Elderly Caucasian male, seated, in no evident distress Head: head normocephalic and atraumatic.  Neck: supple with no carotid or supraclavicular bruits Cardiovascular: regular rate and rhythm, no murmurs Musculoskeletal: no deformity Skin:  no rash/petichiae Vascular:  Normal pulses all extremities Vitals:   05/01/17 1128  BP: (!) 102/57  Pulse: (!) 55   Neurologic Exam Mental Status: Awake and fully alert. Oriented to place and time. Recent and remote memory intact. Attention span, concentration and fund of knowledge appropriate. Mood and affect appropriate.  Cranial Nerves: Fundoscopic exam reveals sharp disc margins. Pupils equal, briskly reactive to light. Extraocular movements full without nystagmus. Visual fields full to confrontation. Hearing diminished bilaterally t. Facial sensation intact. Face, tongue, palate moves normally and symmetrically.  Motor: Normal bulk and tone. Normal strength in all tested extremity muscles. Sensory.: intact to touch ,pinprick .position and vibratory sensation.  Coordination: Rapid alternating movements normal in all extremities. Finger-to-nose and heel-to-shin performed accurately bilaterally. Gait and Station: Arises from chair without difficulty. Stance is normal. Gait demonstrates normal stride length and balance . Able to heel, toe and tandem walk without difficulty.  Reflexes: 1+ and symmetric. Toes downgoing.   NIHSS  0 Modified Rankin  0  ASSESSMENT: 83 year old Caucasian male with episode of vertical diplopia  without ptosis and gait ataxia in November 2018 likely due to unrecognized chronic lacunar infarct which was subsequently seen on MRI scan in December 2018. Vascular risk factors of hypertension and hyperlipidemia.    PLAN: I had a long d/w patient about his episode of vertical diplopia and November 2017 was likely  a small lacunar infarct which was not recognized at that time. 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 We also discussed his, risk for recurrent stroke/TIAs, personally independently reviewed imaging studies and stroke evaluation results and answered questions.Continue Plavix for secondary stroke prevention and maintain strict control of hypertension with blood pressure goal below 130/90, diabetes with hemoglobin A1c goal below 6.5% and lipids with LDL cholesterol goal below 70 mg/dL. I also advised the patient to eat a healthy diet with plenty of whole grains, cereals, fruits and vegetables, exercise regularly and maintain ideal body weight .I advised him to have follow-up lipid profile and hemoglobin A1c checked and his upcoming annual physical visit with his primary physician Dr. Sandi Mariscal. I advised him to take Coenzyme Q10 200 mg to help with his statin related cramps and myalgias. No scheduled follow-up appointment with me is necessary but he may be referred back in the future as needed Greater than 50% of time during this 35 minute visit was spent on counseling,explanation of diagnosis, planning of further management, discussion with patient and family  and coordination of care Antony Contras, MD  [facility] [address] [place], [place] [address]  Phone [phone] Fax [phone] Note: This document was prepared with digital dictation and possible smart phrase technology. Any transcriptional errors that result from this process are unintentional

## 2017-05-01 NOTE — Patient Instructions (Signed)
I had a long d/w patient about his episode of vertical diplopia and November 2017 was likely a small lacunar infarct which was not recognized at that time. 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 We also discussed his, risk for recurrent stroke/TIAs, personally independently reviewed imaging studies and stroke evaluation results and answered questions.Continue Plavix for secondary stroke prevention and maintain strict control of hypertension with blood pressure goal below 130/90, diabetes with hemoglobin A1c goal below 6.5% and lipids with LDL cholesterol goal below 70 mg/dL. I also advised the patient to eat a healthy diet with plenty of whole grains, cereals, fruits and vegetables, exercise regularly and maintain ideal body weight .I advised him to have follow-up lipid profile and hemoglobin A1c checked and his upcoming annual physical visit with his primary physician Dr. Sandi Mariscal. I advised him to take Coenzyme Q10 200 mg to help with his statin related cramps and myalgias. No scheduled follow-up appointment with me is necessary but he may be referred back in the future as needed Stroke Prevention Some medical conditions and behaviors are associated with an increased chance of having a stroke. You may prevent a stroke by making healthy choices and managing medical conditions. How can I reduce my risk of having a stroke?  Stay physically active. Get at least 30 minutes of activity on most or all days.  Do not smoke. It may also be helpful to avoid exposure to secondhand smoke.  Limit alcohol use. Moderate alcohol use is considered to be:  No more than 2 drinks per day for men.  No more than 1 drink per day for nonpregnant women.  Eat healthy foods. This involves:  Eating 5 or more servings of fruits and vegetables a day.  Making dietary changes that address high blood pressure (hypertension), high  cholesterol, diabetes, or obesity.  Manage your cholesterol levels.  Making food choices that are high in fiber and low in saturated fat, trans fat, and cholesterol may control cholesterol levels.  Take any prescribed medicines to control cholesterol as directed by your health care provider.  Manage your diabetes.  Controlling your carbohydrate and sugar intake is recommended to manage diabetes.  Take any prescribed medicines to control diabetes as directed by your health care provider.  Control your hypertension.  Making food choices that are low in salt (sodium), saturated fat, trans fat, and cholesterol is recommended to manage hypertension.  Ask your health care provider if you need treatment to lower your blood pressure. Take any prescribed medicines to control hypertension as directed by your health care provider.  If you are 65-32 years of age, have your blood pressure checked every 3-5 years. If you are 43 years of age or older, have your blood pressure checked every year.  Maintain a healthy weight.  Reducing calorie intake and making food choices that are low in sodium, saturated fat, trans fat, and cholesterol are recommended to manage weight.  Stop drug abuse.  Avoid taking birth control pills.  Talk to your health care provider about the risks of taking birth control pills if you are over 75 years old, smoke, get migraines, or have ever had a blood clot.  Get evaluated for sleep disorders (sleep apnea).  Talk to your health care provider about getting a sleep evaluation if you snore a lot or have excessive sleepiness.  Take medicines only as directed by your health care provider.  For some people,  aspirin or blood thinners (anticoagulants) are helpful in reducing the risk of forming abnormal blood clots that can lead to stroke. If you have the irregular heart rhythm of atrial fibrillation, you should be on a blood thinner unless there is a good reason you cannot  take them.  Understand all your medicine instructions.  Make sure that other conditions (such as anemia or atherosclerosis) are addressed. Get help right away if:  You have sudden weakness or numbness of the face, arm, or leg, especially on one side of the body.  Your face or eyelid droops to one side.  You have sudden confusion.  You have trouble speaking (aphasia) or understanding.  You have sudden trouble seeing in one or both eyes.  You have sudden trouble walking.  You have dizziness.  You have a loss of balance or coordination.  You have a sudden, severe headache with no known cause.  You have new chest pain or an irregular heartbeat. Any of these symptoms may represent a serious problem that is an emergency. Do not wait to see if the symptoms will go away. Get medical help at once. Call your local emergency services (911 in [place] Do not drive yourself to the hospital. This information is not intended to replace advice given to you by your health care provider. Make sure you discuss any questions you have with your health care provider. Document Released: 01/18/2005 Document Revised: 05/18/2016 Document Reviewed: 06/13/2013 Elsevier Interactive Patient Education  2017 [employer].

## 2017-05-02 ENCOUNTER — Ambulatory Visit: Payer: Self-pay | Admitting: Neurology

## 2017-09-18 ENCOUNTER — Other Ambulatory Visit: Payer: Self-pay | Admitting: Cardiology

## 2017-09-18 ENCOUNTER — Ambulatory Visit
Admission: RE | Admit: 2017-09-18 | Discharge: 2017-09-18 | Disposition: A | Discharge status: Home / Self Care | Payer: Medicare Other | Source: Ambulatory Visit | Attending: Cardiology | Admitting: Cardiology

## 2017-09-18 DIAGNOSIS — R079 Chest pain, unspecified: Secondary | ICD-10-CM

## 2017-09-20 ENCOUNTER — Ambulatory Visit
Admission: RE | Admit: 2017-09-20 | Discharge: 2017-09-20 | Disposition: A | Discharge status: Home / Self Care | Payer: Medicare Other | Source: Ambulatory Visit | Attending: Cardiology | Admitting: Cardiology

## 2017-09-20 ENCOUNTER — Other Ambulatory Visit: Payer: Self-pay | Admitting: Cardiology

## 2017-09-20 DIAGNOSIS — R918 Other nonspecific abnormal finding of lung field: Secondary | ICD-10-CM

## 2017-09-21 ENCOUNTER — Encounter: Payer: Self-pay | Admitting: Cardiology

## 2018-01-04 ENCOUNTER — Other Ambulatory Visit: Payer: Self-pay | Admitting: Cardiology

## 2018-01-04 DIAGNOSIS — R918 Other nonspecific abnormal finding of lung field: Secondary | ICD-10-CM

## 2018-01-09 ENCOUNTER — Ambulatory Visit
Admission: RE | Admit: 2018-01-09 | Discharge: 2018-01-09 | Disposition: A | Discharge status: Home / Self Care | Payer: Medicare Other | Source: Ambulatory Visit | Attending: Cardiology | Admitting: Cardiology

## 2018-01-09 DIAGNOSIS — R918 Other nonspecific abnormal finding of lung field: Secondary | ICD-10-CM

## 2018-08-04 IMAGING — CT CT ANGIO HEAD
1 of 14 series · 4 of 33 positions shown · IV contrast (isovue)
Comparison: Brain MRI 12/06/2016

CLINICAL DATA: Embolic stroke.

EXAM:
CT ANGIOGRAPHY HEAD AND NECK
TECHNIQUE: Multidetector CT imaging of the head and neck was performed using
the standard protocol during bolus administration of intravenous
contrast. Multiplanar CT image reconstructions and MIPs were
obtained to evaluate the vascular anatomy. Carotid stenosis
measurements (when applicable) are obtained utilizing NASCET
criteria, using the distal internal carotid diameter as the
denominator.
Creatinine was obtained on site at [HOSPITAL] at [REDACTED].
Results: Creatinine 0.9 mg/dL.
CONTRAST:  75 cc Isovue 370 intravenous

[Series 6: cta head & neck thin · axial · 0.57mm/px · z∈[+36,+275]mm · 4 of 1281 slices shown]
[im 257/1281  soft-tissue]
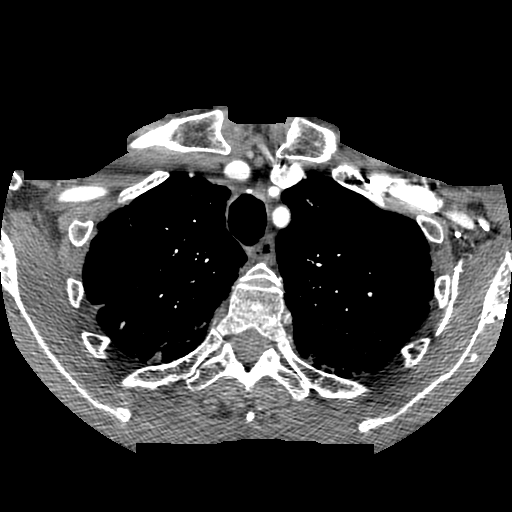
[im 513/1281  bone]
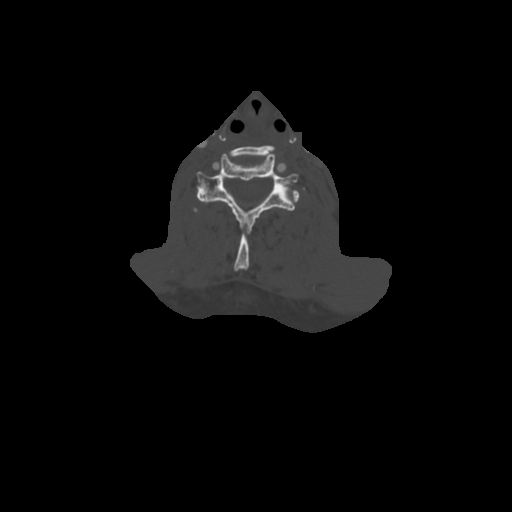
[im 769/1281  soft-tissue]
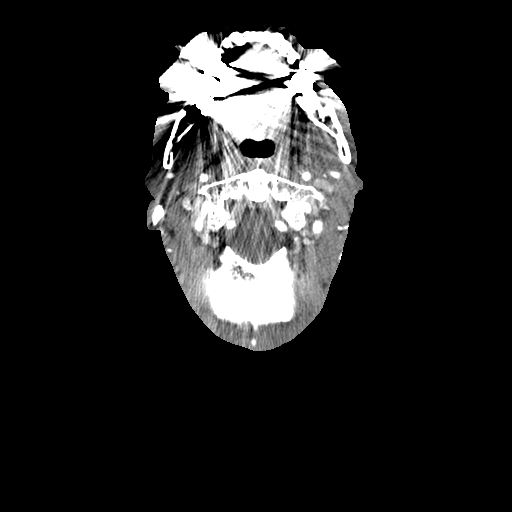
[im 1025/1281  bone]
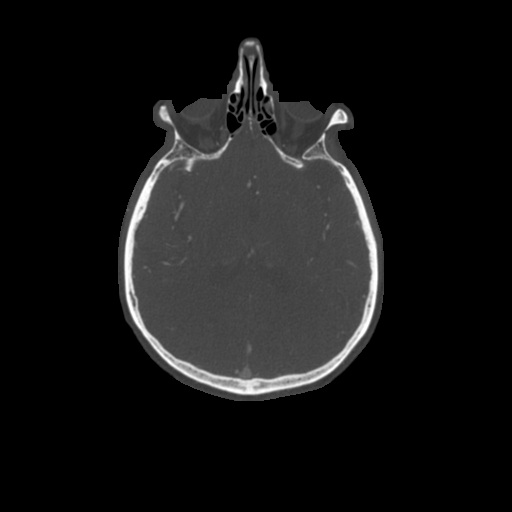

[4 of 33 positions shown; findings below may reference images not displayed]

FINDINGS: CT HEAD FINDINGS

Brain: No evidence of acute infarction, hemorrhage, hydrocephalus,
extra-axial collection or mass. Small arachnoid cyst in the left
middle cranial fossa with mild temporal pole mass-effect. Normal
brain volume.

Vascular: Described below.

Skull: No acute or aggressive finding.

Sinuses: No acute finding

Orbits: Bilateral cataract resection.

Review of the MIP images confirms the above findings

CTA NECK FINDINGS

Aortic arch: Mild atheromatous wall thickening. Changes related to
previous cardiac bypass. Negative for aneurysm.

Right carotid system: Mild atheromatous wall thickening with small
calcification at the carotid bifurcation. No stenosis or ulceration.
No beading.

Left carotid system: Mild calcified plaque at the common carotid
bifurcation. Mild atheromatous wall thickening of the common carotid
artery. No stenosis or ulceration. No beading.

Vertebral arteries: No proximal subclavian or brachiocephalic
stenosis. Calcified plaque at both vertebral ostia without stenosis.
The vertebral arteries are smooth and widely patent.

Skeleton: Degenerative changes without acute or aggressive finding.

Other neck: Small bilateral thyroid nodules, considered incidental
at this size.

Upper chest: Biapical pleural based scarring.

Review of the MIP images confirms the above findings

CTA HEAD FINDINGS

Anterior circulation: Minimal atherosclerotic calcification,
especially for age, on the carotid siphons. No major branch
occlusion or flow limiting stenosis. Negative for aneurysm.

Posterior circulation: Codominant vertebral arteries with standard
branching. No major branch occlusion or flow limiting stenosis.
Negative for aneurysm.

Venous sinuses: Unremarkable

Anatomic variants: None unusual

Delayed phase: Not obtained

Review of the MIP images confirms the above findings
IMPRESSION: Unremarkable CTA of the head neck for age. There is mild
atherosclerosis without stenosis or ulceration.

## 2018-09-25 ENCOUNTER — Ambulatory Visit
Admission: RE | Admit: 2018-09-25 | Discharge: 2018-09-25 | Disposition: A | Discharge status: Home / Self Care | Payer: Medicare Other | Source: Ambulatory Visit | Attending: Family Medicine | Admitting: Family Medicine

## 2018-09-25 ENCOUNTER — Other Ambulatory Visit: Payer: Self-pay | Admitting: Family Medicine

## 2018-09-25 DIAGNOSIS — M542 Cervicalgia: Secondary | ICD-10-CM

## 2018-12-27 ENCOUNTER — Other Ambulatory Visit (HOSPITAL_BASED_OUTPATIENT_CLINIC_OR_DEPARTMENT_OTHER): Payer: Self-pay | Admitting: Cardiology

## 2018-12-27 DIAGNOSIS — R918 Other nonspecific abnormal finding of lung field: Secondary | ICD-10-CM

## 2019-01-05 NOTE — Progress Notes (Signed)
Cardiology Office Note:    Date:  01/06/2019   ID:  Jack James, DOB [birth year], MRN [id number]  PCP:  Derinda Late, MD  Cardiologist:  Shirlee More, MD    Referring MD: Derinda Late, MD    ASSESSMENT:    1. Coronary artery disease of native artery of native heart with stable angina pectoris ([place])   2. S/P mitral valve repair + CABG x3   3. MVP (mitral valve prolapse)   4. Mixed hyperlipidemia   5. PAF (paroxysmal atrial fibrillation) (HCC)    PLAN:    In order of problems listed above:  1. Stable asymptomatic New York Heart Association class I continue medical treatment this time including clopidogrel beta-blocker high intensity statin recheck liver function and lipids for safety and efficacy.  At this time I do not think he requires an ischemia evaluation 2. Stable asymptomatic no MR on exam he has had a good result functional class I.  He will continue to follow endocarditis prophylaxis 3. Stable lipids recheck liver function lipid profile safety and efficacy 4. And history of stroke obvious etiology has a history of atrial fibrillation after surgery in 2017 will undergo an ambulatory two-week monitor to assess for atrial fibrillation if found to require anticoagulation   Next appointment: 6 months   Medication Adjustments/Labs and Tests Ordered: Current medicines are reviewed at length with the patient today.  Concerns regarding medicines are outlined above.  No orders of the defined types were placed in this encounter.  No orders of the defined types were placed in this encounter.   Chief Complaint  Patient presents with  . Follow-up  . Coronary Artery Disease  . Mitral Regurgitation    with MVR    History of Present Illness:    Jack James is a 85 y.o. male with a hx of CAD with PCI of LAD 2011, CABG and MVR in 2016 for severe MR with MVP,hyperlipidemia and stroke  last seen 06/11/2018.  Time his last visit there was also discussion of having  multiple lung nodules.  There is a notation may have a repeat scan performed in 6 months.  He had an echocardiogram 09/21/2017 showing ejection fraction 50 to 55% normal response to mitral valve repair and annuloplasty ring mild pulmonary hypertension moderate to severe right atrial and moderate left atrial enlargement.. Compliance with diet, lifestyle and medications: Yes  Is made a good functional recovery from his stroke no residual visual abnormality he has had no chest pain edema shortness of breath palpitation or syncope.  I discussed this finding of stroke background history of perioperative atrial fibrillation and will pursue an ambulatory heart rhythm monitor for 2 weeks to screen for atrial fibrillation.  He does have first-degree AV block on his EKG. Past Medical History:  Diagnosis Date  . Acute on chronic diastolic heart failure ([place]) 11/19/2015  . Atrial fibrillation ([place]) 01/20/2016   postoperative   . CAD (coronary artery disease), native coronary artery 08/04/2010   Cath 2011 with severe proximal LAD stenosis treated with 2.75 x 24 mm ion stent postdilated to 3.0 mm by Dr. Burt Knack  Catheterization 2016 shows 40% ostial LAD, patent stent, 50% mid LAD, 60% circumflex, and 40% RCA stenosis with severe mitral regurgitation.   . Chronic diastolic CHF (congestive heart failure) ([place])   . Coronary artery disease    a. 2011: DES to proximal LAD  . HLD (hyperlipidemia)   . Hyperlipidemia   . Mitral regurgitation 11/19/2015  . Mitral valve  prolapse   . MVP (mitral valve prolapse) 11/19/2015  . Pleural effusion, bilateral 11/18/2015  . 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 S/P mitral valve repair 11/25/2015   Complex valvuloplasty including quadrangular resection of posterior leaflet, artificial Gore-tex neochord placement x10 and 32 mm Sorin Memo 3D Rechord ring annuloplasty  . Skin cancer   . Stroke [facility])     Past Surgical History:    Procedure Laterality Date  . CARDIAC CATHETERIZATION N/A 11/19/2015   Procedure: Left Heart Cath and Coronary Angiography;  Surgeon: Burnell Blanks, MD;  Location: [place] [facility];  Service: Cardiovascular;  Laterality: N/A;  . CATARACT EXTRACTION, BILATERAL  2017  . CLIPPING OF ATRIAL APPENDAGE  11/25/2015   Procedure: CLIPPING OF ATRIAL APPENDAGE;  Surgeon: Rexene Alberts, MD;  Location: [place];  Service: Open Heart Surgery;;  . CORONARY ARTERY BYPASS GRAFT N/A 11/25/2015   Procedure: CORONARY ARTERY BYPASS GRAFTING (CABG) TIMES THREE USING LEFT INTERNAL MAMMARY ARTERY AND RIGHT SAPHENOUS LEG VEIN HARVESTED ENDOSCOPICALLY;  Surgeon: Rexene Alberts, MD;  Location: [place];  Service: Open Heart Surgery;  Laterality: N/A;  . CORONARY STENT PLACEMENT  2011   DES to proximal LAD  . LITHOTRIPSY    . MITRAL VALVE REPAIR N/A 11/25/2015   Procedure: MITRAL VALVE REPAIR (MVR);  Surgeon: Rexene Alberts, MD;  Location: [place];  Service: Open Heart Surgery;  Laterality: N/A;  . SKIN CANCER EXCISION    . TEE WITHOUT CARDIOVERSION N/A 11/22/2015   Procedure: TRANSESOPHAGEAL ECHOCARDIOGRAM (TEE);  Surgeon: [name], MD;  Location: [facility] ENDOSCOPY;  Service: Cardiovascular;  Laterality: N/A;  . TEE WITHOUT CARDIOVERSION N/A 11/25/2015   Procedure: TRANSESOPHAGEAL ECHOCARDIOGRAM (TEE);  Surgeon: Rexene Alberts, MD;  Location: [place];  Service: Open Heart Surgery;  Laterality: N/A;    Current Medications: Current Meds  Medication Sig  . amoxicillin (AMOXIL) 500 MG capsule TAKE 4 CAPSULES BY MOUTH 1 HOUR BEFORE DENTAL WORK.  Marland Kitchen atorvastatin (LIPITOR) 20 MG tablet Take 20 mg by mouth daily.  . cholecalciferol (VITAMIN D) 1000 UNITS tablet Take 2,000 Units by mouth 2 (two) times daily.   . clopidogrel (PLAVIX) 75 MG tablet Take 75 mg by mouth daily.   . famotidine (PEPCID) 10 MG tablet Take 10 mg by mouth as needed for heartburn or indigestion.  . hyoscyamine (LEVSIN SL) 0.125 MG SL tablet Place  0.125 mg under the tongue every 6 (six) hours as needed.  . metoprolol tartrate (LOPRESSOR) 25 MG tablet Take 25 mg by mouth 2 (two) times daily.   . montelukast (SINGULAIR) 10 MG tablet Take 10 mg by mouth at bedtime.     Allergies:   Sulfa antibiotics   Social History   Socioeconomic History  . Marital status: Widowed    Spouse name: Not on file  . Number of children: 2  . Years of education: Not on file  . Highest education level: Not on file  Occupational History  . Occupation: Retired  Scientific laboratory technician  . Financial resource strain: Not on file  . Food insecurity:    Worry: Not on file    Inability: Not on file  . Transportation needs:    Medical: Not on file    Non-medical: Not on file  Tobacco Use  . Smoking status: Never Smoker  . Smokeless tobacco: Never Used  Substance and Sexual Activity  . Alcohol use:  Yes    Alcohol/week: 1.0 standard drinks    Types: 1 Glasses of wine per week    Comment: 1 glass of wine 3 - 4 times per week   . Drug use: No  . Sexual activity: Not on file  Lifestyle  . Physical activity:    Days per week: Not on file    Minutes per session: Not on file  . Stress: Not on file  Relationships  . Social connections:    Talks on phone: Not on file    Gets together: Not on file    Attends religious service: Not on file    Active member of club or organization: Not on file    Attends meetings of clubs or organizations: Not on file    Relationship status: Not on file  Other Topics Concern  . Not on file  Social History Narrative   Lives at home alone   Right-handed   Caffeine: 2 mugs of decaf coffee per day, green tea     Family History: The patient's family history includes Heart failure in his mother; Lung cancer in his brother; Stroke in his sister. ROS:   Please see the history of present illness.    All other systems reviewed and are negative.  EKGs/Labs/Other Studies Reviewed:    The following studies were reviewed today: EKG  [place] first degree AVB OW normal Recent Labs: No results found for requested labs within last 8760 hours.  Recent Lipid Panel    Component Value Date/Time   CHOL 102 11/20/2015 0030   TRIG 36 11/20/2015 0030   HDL 48 11/20/2015 0030   CHOLHDL 2.1 11/20/2015 0030   VLDL 7 11/20/2015 0030   LDLCALC 47 11/20/2015 0030    Physical Exam:    VS:  BP 126/66 (BP Location: Right Arm, Patient Position: Sitting, Cuff Size: Normal)   Pulse 60   Ht 5\' 10"  (1.778 m)   Wt 158 lb 12.8 oz (72 kg)   SpO2 96%   BMI 22.79 kg/m     Wt Readings from Last 3 Encounters:  01/06/19 158 lb 12.8 oz (72 kg)  05/01/17 161 lb 3.2 oz (73.1 kg)  02/28/17 157 lb (71.2 kg)     GEN:  Well nourished, well developed in no acute distress HEENT: Normal NECK: No JVD; No carotid bruits LYMPHATICS: No lymphadenopathy CARDIAC: RRR, no murmurs, rubs, gallops RESPIRATORY:  Clear to auscultation without rales, wheezing or rhonchi  ABDOMEN: Soft, non-tender, non-distended MUSCULOSKELETAL:  No edema; No deformity  SKIN: Warm and dry NEUROLOGIC:  Alert and oriented x 3 PSYCHIATRIC:  Normal affect    Signed, Shirlee More, MD  01/06/2019 11:10 AM    [place]

## 2019-01-06 ENCOUNTER — Encounter: Payer: Self-pay | Admitting: Cardiology

## 2019-01-06 ENCOUNTER — Ambulatory Visit: Payer: Medicare Other | Admitting: Cardiology

## 2019-01-06 ENCOUNTER — Ambulatory Visit (INDEPENDENT_AMBULATORY_CARE_PROVIDER_SITE_OTHER): Payer: Medicare Other | Admitting: Cardiology

## 2019-01-06 ENCOUNTER — Ambulatory Visit (HOSPITAL_BASED_OUTPATIENT_CLINIC_OR_DEPARTMENT_OTHER)
Admission: RE | Admit: 2019-01-06 | Discharge: 2019-01-06 | Disposition: A | Discharge status: Home / Self Care | Payer: Medicare Other | Source: Ambulatory Visit | Attending: Cardiology | Admitting: Cardiology

## 2019-01-06 VITALS — BP 126/66 | HR 60 | Ht 70.0 in | Wt 158.8 lb

## 2019-01-06 DIAGNOSIS — E782 Mixed hyperlipidemia: Secondary | ICD-10-CM

## 2019-01-06 DIAGNOSIS — I25118 Atherosclerotic heart disease of native coronary artery with other forms of angina pectoris: Secondary | ICD-10-CM | POA: Diagnosis not present

## 2019-01-06 DIAGNOSIS — I341 Nonrheumatic mitral (valve) prolapse: Secondary | ICD-10-CM

## 2019-01-06 DIAGNOSIS — I480 Paroxysmal atrial fibrillation: Secondary | ICD-10-CM

## 2019-01-06 DIAGNOSIS — Z9889 Other specified postprocedural states: Secondary | ICD-10-CM | POA: Diagnosis not present

## 2019-01-06 DIAGNOSIS — R918 Other nonspecific abnormal finding of lung field: Secondary | ICD-10-CM

## 2019-01-06 NOTE — Patient Instructions (Signed)
Medication Instructions:  Your physician recommends that you continue on your current medications as directed. Please refer to the Current Medication list given to you today.  If you need a refill on your cardiac medications before your next appointment, please call your pharmacy.   Lab work: You will have lab work today:  Lipid and CMP If you have labs (blood work) drawn today and your tests are completely normal, you will receive your results only by: Marland Kitchen MyChart Message (if you have MyChart) OR . A paper copy in the mail If you have any lab test that is abnormal or we need to change your treatment, we will call you to review the results.  Testing/Procedures: You had an EKG today  Your physician has recommended that you wear a ZIO patch.  A ZIO [place] is a medical device that records the heart's electrical activity. Doctors most often use these monitors to diagnose arrhythmias. Arrhythmias are problems with the speed or rhythm of the heartbeat. The monitor is a small, portable device. You can wear one while you do your normal daily activities. This is usually used to diagnose what is causing palpitations/syncope (passing out). 2 week ZIO Patch    Follow-Up: At [facility], you and your health needs are our priority.  As part of our continuing mission to provide you with exceptional heart care, we have created designated Provider Care Teams.  These Care Teams include your primary Cardiologist (physician) and Advanced Practice Providers (APPs -  Physician Assistants and Nurse Practitioners) who all work together to provide you with the care you need, when you need it. You will need a follow up appointment in 6 months.  Please call our office 2 months in advance to schedule this appointment.

## 2019-01-07 ENCOUNTER — Telehealth: Payer: Self-pay

## 2019-01-07 LAB — COMPREHENSIVE METABOLIC PANEL
ALK PHOS: 62 IU/L (ref 39–117)
ALT: 20 IU/L (ref 0–44)
AST: 30 IU/L (ref 0–40)
Albumin/Globulin Ratio: 2 (ref 1.2–2.2)
Albumin: 4.4 g/dL (ref 3.5–4.7)
BILIRUBIN TOTAL: 0.7 mg/dL (ref 0.0–1.2)
BUN/Creatinine Ratio: 15 (ref 10–24)
BUN: 15 mg/dL (ref 8–27)
CO2: 27 mmol/L (ref 20–29)
Calcium: 9.7 mg/dL (ref 8.6–10.2)
Chloride: 97 mmol/L (ref 96–106)
Creatinine, Ser: 0.98 mg/dL (ref 0.76–1.27)
GFR calc Af Amer: 83 mL/min/{1.73_m2} (ref >59)
GFR calc non Af Amer: 72 mL/min/{1.73_m2} (ref >59)
GLOBULIN, TOTAL: 2.2 g/dL (ref 1.5–4.5)
Glucose: 88 mg/dL (ref 65–99)
POTASSIUM: 4.3 mmol/L (ref 3.5–5.2)
SODIUM: 138 mmol/L (ref 134–144)
Total Protein: 6.6 g/dL (ref 6.0–8.5)

## 2019-01-07 LAB — LIPID PANEL
CHOLESTEROL TOTAL: 140 mg/dL (ref 100–199)
Chol/HDL Ratio: 1.8 ratio (ref 0.0–5.0)
HDL: 77 mg/dL (ref >39)
LDL CALC: 53 mg/dL (ref 0–99)
Triglycerides: 49 mg/dL (ref 0–149)
VLDL CHOLESTEROL CAL: 10 mg/dL (ref 5–40)

## 2019-01-07 NOTE — Telephone Encounter (Signed)
-----   Message from Richardo Priest, MD sent at 01/07/2019  4:00 PM EST ----- Stable good nodules are small benign. ----- Message ----- From: Austin Miles, RN Sent: 01/07/2019   3:06 PM EST To: Richardo Priest, MD  Please review and advise as Dr. Wynonia Lawman ordered this test. Patient is calling for results. Thanks!

## 2019-01-07 NOTE — Telephone Encounter (Signed)
Patient notified that  CT Chest shows stable results per Dr Bettina Gavia.  Patient advised that nodules are small and benign.  Patient verbalized understanding.

## 2019-01-08 ENCOUNTER — Ambulatory Visit (INDEPENDENT_AMBULATORY_CARE_PROVIDER_SITE_OTHER): Payer: Medicare Other

## 2019-01-08 ENCOUNTER — Telehealth: Payer: Self-pay | Admitting: *Deleted

## 2019-01-08 DIAGNOSIS — I480 Paroxysmal atrial fibrillation: Secondary | ICD-10-CM

## 2019-01-08 NOTE — Telephone Encounter (Signed)
Gave pt copy of CT from 01/06/19 and lab work from 01/06/19 to pt. Pt signed records release.

## 2019-05-01 ENCOUNTER — Telehealth: Payer: Self-pay | Admitting: Cardiology

## 2019-05-01 NOTE — Telephone Encounter (Signed)
Patient called stating he is having some chest pressure for about 2 weeks.  He had been doing some stretching exercises and thought that he had pulled a muscle so he stopped doing those exercises thinking it would get better but it has not.  He does not have SOB or pain in arms or neck.  [facility] him and wanted [facility] to be aware of his plan..  I suggested to [facility] as an option which he is going to think about and discuss with his son.

## 2019-05-02 ENCOUNTER — Observation Stay (HOSPITAL_BASED_OUTPATIENT_CLINIC_OR_DEPARTMENT_OTHER): Payer: Medicare Other

## 2019-05-02 ENCOUNTER — Encounter (HOSPITAL_COMMUNITY): Payer: Self-pay | Admitting: Emergency Medicine

## 2019-05-02 ENCOUNTER — Emergency Department (HOSPITAL_COMMUNITY): Payer: Medicare Other

## 2019-05-02 ENCOUNTER — Observation Stay (HOSPITAL_COMMUNITY)
Admission: EM | Admit: 2019-05-02 | Discharge: 2019-05-03 | Disposition: A | Discharge status: Home / Self Care | Payer: Medicare Other | Attending: Internal Medicine | Admitting: Internal Medicine

## 2019-05-02 ENCOUNTER — Encounter (HOSPITAL_COMMUNITY)
Admission: EM | Disposition: A | Discharge status: Home / Self Care | Payer: Self-pay | Source: Home / Self Care | Attending: Emergency Medicine

## 2019-05-02 ENCOUNTER — Other Ambulatory Visit: Payer: Self-pay

## 2019-05-02 DIAGNOSIS — Z823 Family history of stroke: Secondary | ICD-10-CM | POA: Diagnosis not present

## 2019-05-02 DIAGNOSIS — Z801 Family history of malignant neoplasm of trachea, bronchus and lung: Secondary | ICD-10-CM | POA: Diagnosis not present

## 2019-05-02 DIAGNOSIS — X58XXXA Exposure to other specified factors, initial encounter: Secondary | ICD-10-CM | POA: Diagnosis not present

## 2019-05-02 DIAGNOSIS — Z955 Presence of coronary angioplasty implant and graft: Secondary | ICD-10-CM | POA: Insufficient documentation

## 2019-05-02 DIAGNOSIS — I341 Nonrheumatic mitral (valve) prolapse: Secondary | ICD-10-CM | POA: Diagnosis present

## 2019-05-02 DIAGNOSIS — Z882 Allergy status to sulfonamides status: Secondary | ICD-10-CM | POA: Diagnosis not present

## 2019-05-02 DIAGNOSIS — I25810 Atherosclerosis of coronary artery bypass graft(s) without angina pectoris: Secondary | ICD-10-CM | POA: Diagnosis present

## 2019-05-02 DIAGNOSIS — Z7902 Long term (current) use of antithrombotics/antiplatelets: Secondary | ICD-10-CM | POA: Diagnosis not present

## 2019-05-02 DIAGNOSIS — I200 Unstable angina: Secondary | ICD-10-CM | POA: Diagnosis not present

## 2019-05-02 DIAGNOSIS — Z8249 Family history of ischemic heart disease and other diseases of the circulatory system: Secondary | ICD-10-CM | POA: Diagnosis not present

## 2019-05-02 DIAGNOSIS — E782 Mixed hyperlipidemia: Secondary | ICD-10-CM | POA: Insufficient documentation

## 2019-05-02 DIAGNOSIS — I2510 Atherosclerotic heart disease of native coronary artery without angina pectoris: Secondary | ICD-10-CM | POA: Diagnosis present

## 2019-05-02 DIAGNOSIS — I110 Hypertensive heart disease with heart failure: Secondary | ICD-10-CM | POA: Insufficient documentation

## 2019-05-02 DIAGNOSIS — I2582 Chronic total occlusion of coronary artery: Secondary | ICD-10-CM | POA: Insufficient documentation

## 2019-05-02 DIAGNOSIS — Z85828 Personal history of other malignant neoplasm of skin: Secondary | ICD-10-CM | POA: Diagnosis not present

## 2019-05-02 DIAGNOSIS — R001 Bradycardia, unspecified: Secondary | ICD-10-CM | POA: Insufficient documentation

## 2019-05-02 DIAGNOSIS — I25710 Atherosclerosis of autologous vein coronary artery bypass graft(s) with unstable angina pectoris: Secondary | ICD-10-CM

## 2019-05-02 DIAGNOSIS — Z8673 Personal history of transient ischemic attack (TIA), and cerebral infarction without residual deficits: Secondary | ICD-10-CM | POA: Insufficient documentation

## 2019-05-02 DIAGNOSIS — Z952 Presence of prosthetic heart valve: Secondary | ICD-10-CM | POA: Insufficient documentation

## 2019-05-02 DIAGNOSIS — Z1159 Encounter for screening for other viral diseases: Secondary | ICD-10-CM | POA: Diagnosis not present

## 2019-05-02 DIAGNOSIS — E785 Hyperlipidemia, unspecified: Secondary | ICD-10-CM | POA: Diagnosis not present

## 2019-05-02 DIAGNOSIS — I480 Paroxysmal atrial fibrillation: Secondary | ICD-10-CM | POA: Diagnosis not present

## 2019-05-02 DIAGNOSIS — I5033 Acute on chronic diastolic (congestive) heart failure: Secondary | ICD-10-CM | POA: Diagnosis not present

## 2019-05-02 DIAGNOSIS — I050 Rheumatic mitral stenosis: Secondary | ICD-10-CM | POA: Diagnosis not present

## 2019-05-02 DIAGNOSIS — T82855A Stenosis of coronary artery stent, initial encounter: Secondary | ICD-10-CM | POA: Diagnosis not present

## 2019-05-02 DIAGNOSIS — I25700 Atherosclerosis of coronary artery bypass graft(s), unspecified, with unstable angina pectoris: Principal | ICD-10-CM | POA: Insufficient documentation

## 2019-05-02 DIAGNOSIS — R079 Chest pain, unspecified: Secondary | ICD-10-CM | POA: Insufficient documentation

## 2019-05-02 DIAGNOSIS — Z951 Presence of aortocoronary bypass graft: Secondary | ICD-10-CM

## 2019-05-02 DIAGNOSIS — R0789 Other chest pain: Secondary | ICD-10-CM | POA: Diagnosis present

## 2019-05-02 DIAGNOSIS — I5032 Chronic diastolic (congestive) heart failure: Secondary | ICD-10-CM | POA: Diagnosis present

## 2019-05-02 DIAGNOSIS — R0600 Dyspnea, unspecified: Secondary | ICD-10-CM | POA: Diagnosis not present

## 2019-05-02 DIAGNOSIS — E7800 Pure hypercholesterolemia, unspecified: Secondary | ICD-10-CM | POA: Diagnosis not present

## 2019-05-02 HISTORY — DX: Chest pain, unspecified: R07.9

## 2019-05-02 HISTORY — PX: LEFT HEART CATH AND CORS/GRAFTS ANGIOGRAPHY: CATH118250

## 2019-05-02 LAB — BASIC METABOLIC PANEL
Anion gap: 11 (ref 5–15)
BUN: 15 mg/dL (ref 8–23)
CO2: 25 mmol/L (ref 22–32)
Calcium: 9.4 mg/dL (ref 8.9–10.3)
Chloride: 100 mmol/L (ref 98–111)
Creatinine, Ser: 0.89 mg/dL (ref 0.61–1.24)
GFR calc Af Amer: >60 mL/min (ref >60)
GFR calc non Af Amer: >60 mL/min (ref >60)
Glucose, Bld: 92 mg/dL (ref 70–99)
Potassium: 3.7 mmol/L (ref 3.5–5.1)
Sodium: 136 mmol/L (ref 135–145)

## 2019-05-02 LAB — TROPONIN I
Troponin I: <0.03 ng/mL (ref <0.03)
Troponin I: <0.03 ng/mL (ref <0.03)

## 2019-05-02 LAB — ECHOCARDIOGRAM LIMITED
Height: 70 in
Weight: 2480 oz

## 2019-05-02 LAB — CBC
HCT: 42.6 % (ref 39.0–52.0)
Hemoglobin: 13.7 g/dL (ref 13.0–17.0)
MCH: 31.6 pg (ref 26.0–34.0)
MCHC: 32.2 g/dL (ref 30.0–36.0)
MCV: 98.4 fL (ref 80.0–100.0)
Platelets: 184 10*3/uL (ref 150–400)
RBC: 4.33 MIL/uL (ref 4.22–5.81)
RDW: 11.9 % (ref 11.5–15.5)
WBC: 5.1 10*3/uL (ref 4.0–10.5)
nRBC: 0 % (ref 0.0–0.2)

## 2019-05-02 LAB — SARS CORONAVIRUS 2 BY RT PCR (HOSPITAL ORDER, PERFORMED IN ~~LOC~~ HOSPITAL LAB): SARS Coronavirus 2: NEGATIVE

## 2019-05-02 LAB — BRAIN NATRIURETIC PEPTIDE: B Natriuretic Peptide: 142.4 pg/mL — ABNORMAL HIGH (ref 0.0–100.0)

## 2019-05-02 SURGERY — LEFT HEART CATH AND CORS/GRAFTS ANGIOGRAPHY
Anesthesia: LOCAL

## 2019-05-02 MED ORDER — SODIUM CHLORIDE 0.9% FLUSH
3.0000 mL | Freq: Two times a day (BID) | INTRAVENOUS | Status: DC
  Administered 2019-05-02 – 2019-05-03 (×2): 3 mL via INTRAVENOUS

## 2019-05-02 MED ORDER — SODIUM CHLORIDE 0.9 % WEIGHT BASED INFUSION
1.0000 mL/kg/h | INTRAVENOUS | Status: DC
  Administered 2019-05-02: 1 mL/kg/h via INTRAVENOUS

## 2019-05-02 MED ORDER — ASPIRIN 81 MG PO CHEW: 81.0000 mg | CHEWABLE_TABLET | ORAL | Status: DC

## 2019-05-02 MED ORDER — HYDRALAZINE HCL 20 MG/ML IJ SOLN: 10.0000 mg | INTRAMUSCULAR | Status: DC | PRN

## 2019-05-02 MED ORDER — IOHEXOL 350 MG/ML SOLN
INTRAVENOUS | Status: DC | PRN
  Administered 2019-05-02: 100 mL via INTRAVENOUS

## 2019-05-02 MED ORDER — ACETAMINOPHEN 325 MG PO TABS: 650.0000 mg | ORAL_TABLET | ORAL | Status: DC | PRN

## 2019-05-02 MED ORDER — CLOPIDOGREL BISULFATE 75 MG PO TABS
75.0000 mg | ORAL_TABLET | Freq: Every day | ORAL | Status: DC
  Administered 2019-05-02 – 2019-05-03 (×2): 75 mg via ORAL
  Filled 2019-05-02 (×2): qty 1

## 2019-05-02 MED ORDER — VITAMIN D 1000 UNITS PO TABS: 2000.0000 [IU] | ORAL_TABLET | Freq: Two times a day (BID) | ORAL | Status: DC

## 2019-05-02 MED ORDER — NITROGLYCERIN 0.4 MG SL SUBL
0.4000 mg | SUBLINGUAL_TABLET | Freq: Once | SUBLINGUAL | Status: AC
  Administered 2019-05-02: 0.4 mg via SUBLINGUAL

## 2019-05-02 MED ORDER — MIDAZOLAM HCL 2 MG/2ML IJ SOLN
INTRAMUSCULAR | Status: AC
  Filled 2019-05-02: qty 2

## 2019-05-02 MED ORDER — SODIUM CHLORIDE 0.9% FLUSH: 3.0000 mL | INTRAVENOUS | Status: DC | PRN

## 2019-05-02 MED ORDER — FENTANYL CITRATE (PF) 100 MCG/2ML IJ SOLN
INTRAMUSCULAR | Status: DC | PRN
  Administered 2019-05-02 (×2): 25 ug via INTRAVENOUS

## 2019-05-02 MED ORDER — ENOXAPARIN SODIUM 40 MG/0.4ML ~~LOC~~ SOLN
40.0000 mg | SUBCUTANEOUS | Status: DC
  Filled 2019-05-02: qty 0.4

## 2019-05-02 MED ORDER — VERAPAMIL HCL 2.5 MG/ML IV SOLN
INTRAVENOUS | Status: AC
  Filled 2019-05-02: qty 2

## 2019-05-02 MED ORDER — SODIUM CHLORIDE 0.9 % IV SOLN
INTRAVENOUS | Status: AC
  Administered 2019-05-02: 19:00:00 via INTRAVENOUS

## 2019-05-02 MED ORDER — ONDANSETRON HCL 4 MG/2ML IJ SOLN: 4.0000 mg | Freq: Four times a day (QID) | INTRAMUSCULAR | Status: DC | PRN

## 2019-05-02 MED ORDER — ASPIRIN 81 MG PO CHEW
324.0000 mg | CHEWABLE_TABLET | Freq: Once | ORAL | Status: AC
  Administered 2019-05-02: 324 mg via ORAL
  Filled 2019-05-02: qty 4

## 2019-05-02 MED ORDER — VERAPAMIL HCL 2.5 MG/ML IV SOLN
INTRAVENOUS | Status: DC | PRN
  Administered 2019-05-02: 18:00:00 via INTRA_ARTERIAL

## 2019-05-02 MED ORDER — FENTANYL CITRATE (PF) 100 MCG/2ML IJ SOLN
INTRAMUSCULAR | Status: AC
  Filled 2019-05-02: qty 2

## 2019-05-02 MED ORDER — VITAMIN D 25 MCG (1000 UNIT) PO TABS
2000.0000 [IU] | ORAL_TABLET | Freq: Two times a day (BID) | ORAL | Status: DC
  Administered 2019-05-02 – 2019-05-03 (×2): 2000 [IU] via ORAL
  Filled 2019-05-02 (×3): qty 5
  Filled 2019-05-02: qty 2

## 2019-05-02 MED ORDER — LIDOCAINE HCL (PF) 1 % IJ SOLN
INTRAMUSCULAR | Status: AC
  Filled 2019-05-02: qty 30

## 2019-05-02 MED ORDER — HYOSCYAMINE SULFATE 0.125 MG SL SUBL
0.1250 mg | SUBLINGUAL_TABLET | Freq: Four times a day (QID) | SUBLINGUAL | Status: DC | PRN
  Filled 2019-05-02: qty 1

## 2019-05-02 MED ORDER — SODIUM CHLORIDE 0.9 % IV SOLN: 250.0000 mL | INTRAVENOUS | Status: DC | PRN

## 2019-05-02 MED ORDER — HEPARIN (PORCINE) IN NACL 1000-0.9 UT/500ML-% IV SOLN
INTRAVENOUS | Status: AC
  Filled 2019-05-02: qty 1500

## 2019-05-02 MED ORDER — LABETALOL HCL 5 MG/ML IV SOLN: 10.0000 mg | INTRAVENOUS | Status: DC | PRN

## 2019-05-02 MED ORDER — MIDAZOLAM HCL 2 MG/2ML IJ SOLN
INTRAMUSCULAR | Status: DC | PRN
  Administered 2019-05-02 (×2): 0.5 mg via INTRAVENOUS

## 2019-05-02 MED ORDER — HEPARIN SODIUM (PORCINE) 5000 UNIT/ML IJ SOLN
5000.0000 [IU] | Freq: Three times a day (TID) | INTRAMUSCULAR | Status: DC
  Administered 2019-05-03: 5000 [IU] via SUBCUTANEOUS
  Filled 2019-05-02: qty 1

## 2019-05-02 MED ORDER — ATORVASTATIN CALCIUM 10 MG PO TABS
20.0000 mg | ORAL_TABLET | Freq: Every day | ORAL | Status: DC
  Filled 2019-05-02: qty 2

## 2019-05-02 MED ORDER — NITROGLYCERIN 0.4 MG SL SUBL: 0.4000 mg | SUBLINGUAL_TABLET | SUBLINGUAL | Status: DC | PRN

## 2019-05-02 MED ORDER — METOPROLOL TARTRATE 25 MG PO TABS
25.0000 mg | ORAL_TABLET | Freq: Two times a day (BID) | ORAL | Status: DC
  Administered 2019-05-02 – 2019-05-03 (×3): 25 mg via ORAL
  Filled 2019-05-02 (×3): qty 1

## 2019-05-02 MED ORDER — LIDOCAINE HCL (PF) 1 % IJ SOLN
INTRAMUSCULAR | Status: DC | PRN
  Administered 2019-05-02: 5 mL via INTRADERMAL

## 2019-05-02 MED ORDER — HEPARIN (PORCINE) IN NACL 1000-0.9 UT/500ML-% IV SOLN
INTRAVENOUS | Status: DC | PRN
  Administered 2019-05-02 (×2): 500 mL

## 2019-05-02 MED ORDER — FAMOTIDINE 20 MG PO TABS: 10.0000 mg | ORAL_TABLET | ORAL | Status: DC | PRN

## 2019-05-02 MED ORDER — NITROGLYCERIN 0.4 MG SL SUBL
0.4000 mg | SUBLINGUAL_TABLET | Freq: Once | SUBLINGUAL | Status: AC
  Administered 2019-05-02: 0.4 mg via SUBLINGUAL
  Filled 2019-05-02: qty 1

## 2019-05-02 MED ORDER — HEPARIN SODIUM (PORCINE) 1000 UNIT/ML IJ SOLN
INTRAMUSCULAR | Status: DC | PRN
  Administered 2019-05-02: 3500 [IU] via INTRAVENOUS

## 2019-05-02 MED ORDER — SODIUM CHLORIDE 0.9 % WEIGHT BASED INFUSION
3.0000 mL/kg/h | INTRAVENOUS | Status: DC
  Administered 2019-05-02: 3 mL/kg/h via INTRAVENOUS

## 2019-05-02 MED ORDER — MONTELUKAST SODIUM 10 MG PO TABS
10.0000 mg | ORAL_TABLET | Freq: Every day | ORAL | Status: DC
  Administered 2019-05-02: 10 mg via ORAL
  Filled 2019-05-02: qty 1

## 2019-05-02 SURGICAL SUPPLY — 10 items
CATH INFINITI 5FR MULTPACK ANG (CATHETERS) ×2 IMPLANT
DEVICE RAD COMP TR BAND LRG (VASCULAR PRODUCTS) ×2 IMPLANT
GLIDESHEATH SLEND A-KIT 6F 22G (SHEATH) ×2 IMPLANT
GUIDEWIRE INQWIRE 1.5J.035X260 (WIRE) ×1 IMPLANT
INQWIRE 1.5J .035X260CM (WIRE) ×2
KIT HEART LEFT (KITS) ×2 IMPLANT
PACK CARDIAC CATHETERIZATION (CUSTOM PROCEDURE TRAY) ×2 IMPLANT
SHEATH PROBE COVER 6X72 (BAG) ×4 IMPLANT
TRANSDUCER W/STOPCOCK (MISCELLANEOUS) ×2 IMPLANT
TUBING CIL FLEX 10 FLL-RA (TUBING) ×2 IMPLANT

## 2019-05-02 NOTE — ED Notes (Signed)
Pt states he has a phone and can update his family but his daughter wants to be in on call when they make disposion

## 2019-05-02 NOTE — Consult Note (Signed)
Cardiology Consultation:   Patient ID: Jack James; [id number]; 07-08-1934   Admit date: 05/02/2019 Date of Consult: 05/02/2019  Primary Care Provider: Derinda Late, MD Primary Cardiologist: Dr. Shirlee More, MD: Previously Dr. Wynonia Lawman  Patient Profile:   Jack James is a 85 y.o. male with a hx of CAD s/p CABG and MVR in 2016 for severe MR with MVP, prior PCI of LAD in 2011, postoperative atrial fibrillation with no recurrence, HLD, HTN and CVA 2018 who is being seen today for the evaluation of chest pain at the request of Dr. Philipp Ovens.  History of Present Illness:   Jack James is an 85 year old male with a history stated above who presented to [facility] on 05/02/2019 with several weeks of intermittent, exertional chest pain. At baseline prior to COVID-19, the states is very active, reporting that he would exercise at the gym at least 3 times per week without anginal symptoms. After isolation, the patient reports that he has been performing activities such as yard work and labor-intensive chores without chest discomfort. Over the last several weeks, he has began experiencing exertional chest pain with these activities described as a central pressure/tightness.  He reports that the chest pain would relieved with rest but continued to persist with associated shortness of breath which is noted to be a change from his baseline. He denies LE swelling, unusual weight gain, orthopnea symptoms, dizziness, presyncopal or syncopal episodes. He denies recent fever, cough or sick contacts.  He is currently undergoing his echocardiogram at this time.  In the ED, EKG with NSR with first-degree A-V block and mild up-sloping of ST segment in inferior leads and no acute ischemic changes, change from prior tracings in 2016.  Initial troponin is negative at <0.03.  BNP was found to be mildly elevated at 142.  CXR with no acute cardiopulmonary disease and stable chronic lung changes.  He was given SLN TG x2 with  improvement in his anginal symptoms.  Jack James was last seen by his primary cardiologist 01/06/2019 for follow-up of CAD and MR with repair.  His last echocardiogram 09/13/2017 showed an LVEF of 50 to 55% with stable mitral valve repair with annuloplasty ring, mild pulmonary hypertension, moderate to severe right atrial enlargement and moderate left atrial enlargement.  At that time, he was asymptomatic from a cardiac perspective.  He was continued on Plavix, beta-blocker and high intensity statin.  He has a prior history of stroke with obvious etiology of atrial fibrillation in the postoperative setting dating back to 2017.  Given this, Dr. Bettina Gavia placed him on an ambulatory two-week monitor to assess for atrial fibrillation recurrence.  Zio  patch was worn from 01/08/2019 for 14 days that showed no episodes of atrial fibrillation indicative of stroke etiology.  In 2016, the patient developed progressive shortness of breath and chest discomfort and underwent a cardiac catheterization that showed a patent stent in the mid LAD with minimal restenosis.  He had moderate stenosis in the proximal circumflex that did not appear to be flow-limiting and moderate stenosis in the mid LAD beyond the old stented segment and mild to moderate stenosis of the mid RCA.  He was noted to have 3+ mitral regurgitation.  A TEE was performed which showed severe mitral regurgitation due to partially flail posterior leaflet and ruptured cord.  TCTS was consulted and felt that he was a good candidate for mitral valve repair/replacement as well as possible concomitant bypass grafting for his coronary disease.  He was discharged  and returned several weeks later for his surgery.  His early postoperative recovery was notable for some postoperative atrial flutter and he was discharged from the hospital on amiodarone and Coumadin therapies.  During his follow-up with Dr. Roxy Manns, the patient was asking to be taken off of Coumadin in which Dr.  Roxy Manns agreed if okay with Dr. Wynonia Lawman, his primary cardiologist at the time.  06/2016 as well as 07/2016 the patient underwent cataract surgery. In 10/2016 the patient was noted to be having acute onset visual disturbances including double vision.  MRI showed a tiny right midbrain infarct of undetermined age. A complete stroke work-up with CTA and neck was planned.  Recommendations were to continue Plavix for secondary stroke prevention and maintain strict control of hypertension, blood glucose and LDL.  He was then seen by Dr. Leonie Man 05/01/2017 for follow-up.  CT angio of the brain and neck was completed 02/09/2017 which showed no significant intracranial or extracranial stenosis per Dr. Leonie Man notes.   Cardiology was asked to further evaluate given his current symptoms and past history.  Past Medical History:  Diagnosis Date  . Acute on chronic diastolic heart failure ([place]) 11/19/2015  . Atrial fibrillation ([place]) 01/20/2016   postoperative   . CAD (coronary artery disease), native coronary artery 08/04/2010   Cath 2011 with severe proximal LAD stenosis treated with 2.75 x 24 mm ion stent postdilated to 3.0 mm by Dr. Burt Knack  Catheterization 2016 shows 40% ostial LAD, patent stent, 50% mid LAD, 60% circumflex, and 40% RCA stenosis with severe mitral regurgitation.   . Chronic diastolic CHF (congestive heart failure) ([place])   . Coronary artery disease    a. 2011: DES to proximal LAD  . HLD (hyperlipidemia)   . Hyperlipidemia   . Mitral regurgitation 11/19/2015  . Mitral valve prolapse   . MVP (mitral valve prolapse) 11/19/2015  . Pleural effusion, bilateral 11/18/2015  . 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 S/P mitral valve repair 11/25/2015   Complex valvuloplasty including quadrangular resection of posterior leaflet, artificial Gore-tex neochord placement x10 and 32 mm Sorin Memo 3D Rechord ring annuloplasty  . Skin cancer   . Stroke [facility])     Past  Surgical History:  Procedure Laterality Date  . CARDIAC CATHETERIZATION N/A 11/19/2015   Procedure: Left Heart Cath and Coronary Angiography;  Surgeon: Burnell Blanks, MD;  Location: [facility];  Service: Cardiovascular;  Laterality: N/A;  . CATARACT EXTRACTION, BILATERAL  2017  . CLIPPING OF ATRIAL APPENDAGE  11/25/2015   Procedure: CLIPPING OF ATRIAL APPENDAGE;  Surgeon: Rexene Alberts, MD;  Location: [place];  Service: Open Heart Surgery;;  . CORONARY ARTERY BYPASS GRAFT N/A 11/25/2015   Procedure: CORONARY ARTERY BYPASS GRAFTING (CABG) TIMES THREE USING LEFT INTERNAL MAMMARY ARTERY AND RIGHT SAPHENOUS LEG VEIN HARVESTED ENDOSCOPICALLY;  Surgeon: Rexene Alberts, MD;  Location: [employer];  Service: Open Heart Surgery;  Laterality: N/A;  . CORONARY STENT PLACEMENT  2011   DES to proximal LAD  . LITHOTRIPSY    . MITRAL VALVE REPAIR N/A 11/25/2015   Procedure: MITRAL VALVE REPAIR (MVR);  Surgeon: Rexene Alberts, MD;  Location: [place];  Service: Open Heart Surgery;  Laterality: N/A;  . SKIN CANCER EXCISION    . TEE WITHOUT CARDIOVERSION N/A 11/22/2015   Procedure: TRANSESOPHAGEAL ECHOCARDIOGRAM (TEE);  Surgeon: [name], MD;  Location: [place];  Service: Cardiovascular;  Laterality: N/A;  . TEE WITHOUT CARDIOVERSION N/A 11/25/2015   Procedure: TRANSESOPHAGEAL ECHOCARDIOGRAM (TEE);  Surgeon: Rexene Alberts, MD;  Location: [place];  Service: Open Heart Surgery;  Laterality: N/A;     Prior to Admission medications   Medication Sig Start Date End Date Taking? Authorizing Provider  amoxicillin (AMOXIL) 500 MG capsule Take 2,000 mg by mouth See admin instructions. One hour prior to dental work 01/31/17  Yes [provider]  atorvastatin (LIPITOR) 20 MG tablet Take 20 mg by mouth daily.   Yes [provider]  cholecalciferol (VITAMIN D) 1000 UNITS tablet Take 2,000 Units by mouth 2 (two) times daily.    Yes [provider]  clopidogrel (PLAVIX) 75 MG  tablet Take 75 mg by mouth daily.  01/03/17  Yes [provider]  famotidine (PEPCID) 10 MG tablet Take 10 mg by mouth as needed for heartburn or indigestion.   Yes [provider]  hyoscyamine (LEVSIN SL) 0.125 MG SL tablet Place 0.125 mg under the tongue every 6 (six) hours as needed for cramping.    Yes [provider]  metoprolol tartrate (LOPRESSOR) 25 MG tablet Take 25 mg by mouth 2 (two) times daily.  03/23/17  Yes [provider]  montelukast (SINGULAIR) 10 MG tablet Take 10 mg by mouth at bedtime.   Yes [provider]    Inpatient Medications: Scheduled Meds:  Continuous Infusions:  PRN Meds:   Allergies:    Allergies  Allergen Reactions  . Sulfa Antibiotics Rash    Social History:   Social History   Socioeconomic History  . Marital status: Widowed    Spouse name: Not on file  . Number of children: 2  . Years of education: Not on file  . Highest education level: Not on file  Occupational History  . Occupation: Retired  Scientific laboratory technician  . Financial resource strain: Not on file  . Food insecurity:    Worry: Not on file    Inability: Not on file  . Transportation needs:    Medical: Not on file    Non-medical: Not on file  Tobacco Use  . Smoking status: Never Smoker  . Smokeless tobacco: Never Used  Substance and Sexual Activity  . Alcohol use: Yes    Alcohol/week: 1.0 standard drinks    Types: 1 Glasses of wine per week    Comment: 1 glass of wine 3 - 4 times per week   . Drug use: No  . Sexual activity: Not on file  Lifestyle  . Physical activity:    Days per week: Not on file    Minutes per session: Not on file  . Stress: Not on file  Relationships  . Social connections:    Talks on phone: Not on file    Gets together: Not on file    Attends religious service: Not on file    Active member of club or organization: Not on file    Attends meetings of clubs or organizations: Not on file    Relationship status:  Not on file  . Intimate partner violence:    Fear of current or ex partner: Not on file    Emotionally abused: Not on file    Physically abused: Not on file    Forced sexual activity: Not on file  Other Topics Concern  . Not on file  Social History Narrative   Lives at home alone   Right-handed   Caffeine: 2 mugs of decaf coffee per  day, green tea    Family History:   Family History  Problem Relation Age of Onset  . Heart failure Mother   . Stroke Sister   . Lung cancer Brother    Family Status:  Family Status  Relation Name Status  . Mother  Deceased  . Sister  Deceased  . Father  Deceased  . Brother  Deceased  . Sister  Alive   ROS:  Please see the history of present illness.  All other ROS reviewed and negative.     Physical Exam/Data:   Vitals:   05/02/19 1100 05/02/19 1115 05/02/19 1130 05/02/19 1145  BP: (!) 100/53 122/74 (!) 109/55 (!) 106/59  Pulse: (!) 55 64 62 62  Resp: 14 17 20 15   Temp:      TempSrc:      SpO2: 98% 100% 98% 98%  Weight:      Height:       No intake or output data in the 24 hours ending 05/02/19 1245 Filed Weights   05/02/19 0748  Weight: 70.3 kg   Body mass index is 22.24 kg/m.   Physical Exam: Blood pressure 99/67, pulse (!) 58, temperature (!) 97.5 F (36.4 C), temperature source Oral, resp. rate 15, height 5\' 10"  (1.778 m), weight 70.3 kg, SpO2 99 %.  GEN:  Well nourished, well developed elderly  in no acute distress HEENT: Normal NECK: No JVD; No carotid bruits LYMPHATICS: No lymphadenopathy CARDIAC: RRR , soft systolic murmur  RESPIRATORY:  Clear to auscultation without rales, wheezing or rhonchi  ABDOMEN: Soft, non-tender, non-distended MUSCULOSKELETAL:  No edema; No deformity  SKIN: Warm and dry NEUROLOGIC:  Alert and oriented x 3     EKG:  The EKG was personally reviewed and demonstrates: NSR with first-degree A-V block and mild up-sloping of ST segment in inferior leads and no acute ischemic changes, change  from prior tracings in 2016. Telemetry:  Telemetry was personally reviewed and demonstrates:   NSR  Relevant CV Studies:  Echocardiogram 11/22/2015: Study Conclusions  - Left ventricle: The cavity size was normal. There was mild   concentric hypertrophy. Systolic function was normal. The   estimated ejection fraction was in the range of 60% to 65%. Wall   motion was normal; there were no regional wall motion   abnormalities. - Aortic valve: No evidence of vegetation. - Mitral valve: Myxomatous degeneration of the valve with   thickening of the AMVL and mild prolapse. There is a flail   posterior chord and P2 (pan leaflet prolapse) with severe   centro-posteriorly directed mitral regurgitation. - Left atrium: The atrium was dilated. No evidence of thrombus in   the atrial cavity or appendage. - Right atrium: The atrium was dilated. - Atrial septum: No defect or patent foramen ovale was identified. - Pulmonic valve: No evidence of vegetation.  Impressions:  - Myxomatous degeneration of the mitral valve with AMVL thickening   and a flail PMVL chord with pan-leaflet prolpase and severe   centro-posteriorly directed regurgitation.  Cardiac catheterization 11/19/2015: Triple vessel CAD  2. Patent stent mid LAD with minimal restenosis.  3. Moderate stenosis proximal Circumflex that does not appear to be flow limiting.  4. Moderate stenosis mid LAD beyond the old stented segment 5. Mild to Moderate stenosis mid RCA 6. 3+ Mitral regurgitation 7. Normal LV systolic function  Recommendations: His clinical presentation includes acute CHF with pleuritic chest pain and dyspnea. There are no severe flow limiting lesions noted on cath although he  does have moderate CAD. He is also noted to have 3+ MR on cath. Echo is pending. At this time, would follow results of echo and continue diuresis with IV Lasix. Medical management of CAD at this time but may need revascularization if mitral valve  is repaired/replaced.   Laboratory Data:  Chemistry Recent Labs  Lab 05/02/19 0755  NA 136  K 3.7  CL 100  CO2 25  GLUCOSE 92  BUN 15  CREATININE 0.89  CALCIUM 9.4  GFRNONAA >60  GFRAA >60  ANIONGAP 11    Total Protein  Date Value Ref Range Status  01/06/2019 6.6 6.0 - 8.5 g/dL Final   Albumin  Date Value Ref Range Status  01/06/2019 4.4 3.5 - 4.7 g/dL Final    Comment:        **Effective January 13, 2019 Albumin reference**       interval will be changing to:              Age                Male          Male           0 -  7 days        3.6 - 4.9      3.6 - 4.9           8 - 30 days        3.4 - 4.7      3.4 - 4.7           1 -  6 month       3.7 - 4.8      3.7 - 4.8    7 months -  2 years       3.9 - 5.0      3.9 - 5.0           3 -  5 years       4.0 - 5.0      4.0 - 5.0           6 - 12 years       4.1 - 5.0      4.0 - 5.0          13 - 30 years       4.1 - 5.2      3.9 - 5.0          31 - 50 years       4.0 - 5.0      3.8 - 4.8          51 - 60 years       3.8 - 4.9      3.8 - 4.9          61 - 70 years       3.8 - 4.8      3.8 - 4.8          71 - 80 years       3.7 - 4.7      3.7 - 4.7          81 - 89 years       3.6 - 4.6      3.6 - 4.6              >89 years       3.5 - 4.6      3.5 - 4.6    AST  Date Value Ref Range Status  01/06/2019 30 0 - 40 IU/L Final   ALT  Date Value Ref Range Status  01/06/2019 20 0 - 44 IU/L Final   Alkaline Phosphatase  Date Value Ref Range Status  01/06/2019 62 39 - 117 IU/L Final   Bilirubin Total  Date Value Ref Range Status  01/06/2019 0.7 0.0 - 1.2 mg/dL Final   Hematology Recent Labs  Lab 05/02/19 0755  WBC 5.1  RBC 4.33  HGB 13.7  HCT 42.6  MCV 98.4  MCH 31.6  MCHC 32.2  RDW 11.9  PLT 184   Cardiac Enzymes Recent Labs  Lab 05/02/19 0755  TROPONINI <0.03   No results for input(s): TROPIPOC in the last 168 hours.  BNP Recent Labs  Lab 05/02/19 0755  BNP 142.4*    DDimer No results for  input(s): DDIMER in the last 168 hours. TSH:  Lab Results  Component Value Date   TSH 1.078 11/23/2015   Lipids: Lab Results  Component Value Date   CHOL 140 01/06/2019   HDL 77 01/06/2019   LDLCALC 53 01/06/2019   TRIG 49 01/06/2019   CHOLHDL 1.8 01/06/2019   HgbA1c: Lab Results  Component Value Date   HGBA1C 5.8 (H) 11/23/2015    Radiology/Studies:  Dg Chest Portable 1 View  Result Date: 05/02/2019 CLINICAL DATA:  85 year old with chest pain. EXAM: PORTABLE CHEST 1 VIEW COMPARISON:  01/06/2019 FINDINGS: Heart size is stable with post cardiac surgical changes including mitral valve surgery. Small scattered densities in the upper lungs, particularly on the right side, have minimally changed. No new airspace disease or consolidation. Heart size is normal. Negative for a pneumothorax. IMPRESSION: 1. No acute cardiopulmonary disease. 2. Stable chronic lung changes. Electronically Signed   By: Markus Daft M.D.   On: 05/02/2019 08:14    Assessment and Plan:   1.  Unstable angina with prior history of PCI/CABG/MVR repair: -Patient has a prior history of PCI to LAD in 2011.  Presented with persistent shortness of breath in 2016 found to have severe mitral regurgitation. Underwent a cardiac catheterization which showed patent stent in the mid LAD with minimal restenosis. He had moderate, non-flow-limiting stenosis in the pLCX, moderate stenosis in the mLAD beyond the old stented segment and mild to moderate stenosis of the mRCA.  He was noted to have 3+ mitral regurgitation that time.  A TEE was performed which showed severe mitral regurgitation due to partially flail posterior leaflet and ruptured cord. TCTS was consulted and felt that he was a good candidate for mitral valve repair/replacement as well as possible concomitant bypass grafting for his coronary disease.  -He has done well since his surgery without anginal recurrence -Reports to be very active at baseline including regular  exercise at least 3 days/week without complication however has recently been experiencing exertional chest pain which is relieved with rest as well as exertional shortness of breath which is different from his baseline -Presented to the ED with symptoms consistent with unstable angina -EKG without acute ischemic changes -Initial troponin is negative at <0.03, continue to trend -BNP was mildly elevated however, CXR without acute pulmonary edema -Echocardiogram, currently pending -Given his prior history, and no recent ischemic work-up would plan for a cardiac catheterization at this time for more definitive evaluation as his symptoms seem highly suspicious of ACS  2.  History of CVA 2018: -In 2018 patient began having visual disturbances including double vision and was referred to neurologist.  An MRI was  obtained which showed a tiny midbrain infarct of undetermined age with recommendations for Plavix as well as aggressive risk modification including well maintained blood pressure, glucose and LDL control -Continue Plavix, statin therapies -Would increase atorvastatin to 80 mg daily if no contraindication including intolerances  3.  History of postoperative atrial fibrillation: -After CABG with MVR in 2016 the patient experienced postoperative atrial fibrillation in which he was discharged from the hospital on amiodarone and Coumadin therapy -He was eventually taken off of these medications due to non-recurrence -He was last seen by Dr. Bettina Gavia 12/2018 and wore a 2-week monitor to reassess recurrence. -ZIO patch worn for 14 days showed no atrial fibrillation or flutter -Continue home dose metoprolol  5.  Hypertension: -Stable, 106/59, 109/55, 122/74 -Could consider reducing metoprolol to 12.5 mg twice daily given low normal BP otherwise, continue to monitor closely for symptoms  5.  HLD: -Last LDL, 53 at goal of 70mg /dl -Continue statin   For questions or updates, please contact [place] Please consult [email or web address] for contact info under Cardiology/STEMI.   Lyndel Safe NP-C [facility] Pager: [phone] 05/02/2019 12:45 PM   Attending Note:   The patient was seen and examined.  Agree with assessment and plan as noted above.  Changes made to the above note as needed.  Patient seen and independently examined with Kathyrn Drown, NP .   We discussed all aspects of the encounter. I agree with the assessment and plan as stated above.  1.   Unstable angina :   Patient has developed worsening shortness of breath with exertion as well as some chest tightness with exertion.  The symptoms have been progressive for the past several weeks.  He states that this feels like his symptoms prior to his coronary artery bypass grafting. Troponin levels were negative.  EKG is nonacute.  I think at this point it would be best to do a right heart catheterization.  We have discussed the risk, benefits, options of heart cath.  He understands and agrees to proceed.   2.   PAF : He has a history of postoperative atrial fibrillation.  Continue to follow.  He is worn an event monitor which did not she show any evidence of recent A. fib.  3. Hx of CVA : Continue Plavix and aspirin.  He has been evaluated by the neuro team.  His stroke was not felt to be associated with his paroxysmal atrial fibrillation   I have spent a total of 40 minutes with patient reviewing hospital  notes , telemetry, EKGs, labs and examining patient as well as establishing an assessment and plan that was discussed with the patient. > 50% of time was spent in direct patient care.    Thayer Headings, Brooke Bonito., MD, [facility] 05/02/2019, 2:02 PM [address],  [place] Pager [phone]

## 2019-05-02 NOTE — CV Procedure (Signed)
   Left heart cath, coronary angiography, bypass graft angiography including left internal mammary and left ventricular hemodynamic recordings via left radial approach using real-time vascular ultrasound for access.  Totally occluded mid RCA  Patent diffusely diseased LAD with up to 50 to 60% mid vessel narrowing.  LIMA is atretic but patent and has competitive flow into the distal LAD.  The LAD between the first diagonal and the site of LIMA insertion contains moderate diffuse disease with a 50 to 70% narrowing near a small second diagonal.  50% proximal circumflex.  Fills with competitive flow into 2 large obtuse marginal.  Widely patent left main  Patent saphenous vein graft to distal RCA.  The graft has 2 areas of dilatation proximally and in the mid vessel had site of vein valves.  Patent saphenous vein graft to the second obtuse marginal  LIMA to LAD patent but atretic with decreased flow and competitive flow with native LAD.  Low LVEDP.  The patient does not have obvious high-grade obstructive disease to account for anginal complaints.  I am somewhat suspicious of the native mid LAD as it could be an intermediate geographic stenosis that is hemodynamically significant.  FFR/DFR unfortunately was not performed.  If continued symptoms, Lexiscan Myoview looking for evidence of apical ischemia would be helpful and guide treatment of the LAD native vessel.

## 2019-05-02 NOTE — ED Provider Notes (Addendum)
[facility] EMERGENCY DEPARTMENT Provider Note   CSN: [id number] Arrival date & time: 05/02/19  0741    History   Chief Complaint Chief Complaint  Patient presents with  . Chest Pain    HPI Jack James is a 85 y.o. male.     The history is provided by the patient.  Chest Pain  Pain location:  Substernal area Pain quality: pressure   Pain radiates to:  Does not radiate Pain severity:  Mild Onset quality:  Gradual Duration:  2 weeks Timing:  Intermittent Progression:  Waxing and waning Chronicity:  New Context comment:  Exertion Relieved by:  Rest Worsened by:  Exertion Associated symptoms: no abdominal pain, no altered mental status, no anxiety, no back pain, no cough, no dizziness, no fever, no lower extremity edema, no orthopnea, no palpitations, no shortness of breath and no vomiting   Risk factors: coronary artery disease, high cholesterol and hypertension     Past Medical History:  Diagnosis Date  . Acute on chronic diastolic heart failure ([place]) 11/19/2015  . Atrial fibrillation ([place]) 01/20/2016   postoperative   . CAD (coronary artery disease), native coronary artery 08/04/2010   Cath 2011 with severe proximal LAD stenosis treated with 2.75 x 24 mm ion stent postdilated to 3.0 mm by Dr. Burt Knack  Catheterization 2016 shows 40% ostial LAD, patent stent, 50% mid LAD, 60% circumflex, and 40% RCA stenosis with severe mitral regurgitation.   . Chronic diastolic CHF (congestive heart failure) ([place])   . Coronary artery disease    a. 2011: DES to proximal LAD  . HLD (hyperlipidemia)   . Hyperlipidemia   . Mitral regurgitation 11/19/2015  . Mitral valve prolapse   . MVP (mitral valve prolapse) 11/19/2015  . Pleural effusion, bilateral 11/18/2015  . 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 S/P mitral valve repair 11/25/2015   Complex valvuloplasty including quadrangular resection of posterior leaflet,  artificial Gore-tex neochord placement x10 and 32 mm Sorin Memo 3D Rechord ring annuloplasty  . Skin cancer   . Stroke [facility])     Patient Active Problem List   Diagnosis Date Noted  . Atrial fibrillation ([place]) 01/20/2016  . S/P mitral valve repair + CABG x3 11/25/2015  . 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 MVP (mitral valve prolapse) 11/19/2015  . Chronic diastolic CHF (congestive heart failure) ([place]) 11/19/2015  . Hyperlipidemia   . Pleural effusion, bilateral 11/18/2015  . CAD (coronary artery disease), native coronary artery 08/04/2010    Past Surgical History:  Procedure Laterality Date  . CARDIAC CATHETERIZATION N/A 11/19/2015   Procedure: Left Heart Cath and Coronary Angiography;  Surgeon: Burnell Blanks, MD;  Location: [facility];  Service: Cardiovascular;  Laterality: N/A;  . CATARACT EXTRACTION, BILATERAL  2017  . CLIPPING OF ATRIAL APPENDAGE  11/25/2015   Procedure: CLIPPING OF ATRIAL APPENDAGE;  Surgeon: Rexene Alberts, MD;  Location: [facility];  Service: Open Heart Surgery;;  . CORONARY ARTERY BYPASS GRAFT N/A 11/25/2015   Procedure: CORONARY ARTERY BYPASS GRAFTING (CABG) TIMES THREE USING LEFT INTERNAL MAMMARY ARTERY AND RIGHT SAPHENOUS LEG VEIN HARVESTED ENDOSCOPICALLY;  Surgeon: Rexene Alberts, MD;  Location: [facility];  Service: Open Heart Surgery;  Laterality: N/A;  . CORONARY STENT PLACEMENT  2011   DES to proximal LAD  . LITHOTRIPSY    . MITRAL VALVE REPAIR N/A 11/25/2015   Procedure: MITRAL  VALVE REPAIR (MVR);  Surgeon: Rexene Alberts, MD;  Location: [facility];  Service: Open Heart Surgery;  Laterality: N/A;  . SKIN CANCER EXCISION    . TEE WITHOUT CARDIOVERSION N/A 11/22/2015   Procedure: TRANSESOPHAGEAL ECHOCARDIOGRAM (TEE);  Surgeon: [name], MD;  Location: [facility] ENDOS[facility];  Service: Cardiovascular;  Laterality: N/A;  . TEE WITHOUT CARDIOVERSION N/A 11/25/2015   Procedure: TRANSESOPHAGEAL ECHOCARDIOGRAM (TEE);  Surgeon: Rexene Alberts, MD;  Location: [facility];   Service: Open Heart Surgery;  Laterality: N/A;        Home Medications    Prior to Admission medications   Medication Sig Start Date End Date Taking? Authorizing Provider  amoxicillin (AMOXIL) 500 MG capsule Take 2,000 mg by mouth See admin instructions. One hour prior to dental work 01/31/17  Yes [provider]  atorvastatin (LIPITOR) 20 MG tablet Take 20 mg by mouth daily.   Yes [provider]  cholecalciferol (VITAMIN D) 1000 UNITS tablet Take 2,000 Units by mouth 2 (two) times daily.    Yes [provider]  clopidogrel (PLAVIX) 75 MG tablet Take 75 mg by mouth daily.  01/03/17  Yes [provider]  famotidine (PEPCID) 10 MG tablet Take 10 mg by mouth as needed for heartburn or indigestion.   Yes [provider]  hyoscyamine (LEVSIN SL) 0.125 MG SL tablet Place 0.125 mg under the tongue every 6 (six) hours as needed for cramping.    Yes [provider]  metoprolol tartrate (LOPRESSOR) 25 MG tablet Take 25 mg by mouth 2 (two) times daily.  03/23/17  Yes [provider]  montelukast (SINGULAIR) 10 MG tablet Take 10 mg by mouth at bedtime.   Yes [provider]    Family History Family History  Problem Relation Age of Onset  . Heart failure Mother   . Stroke Sister   . Lung cancer Brother     Social History Social History   Tobacco Use  . Smoking status: Never Smoker  . Smokeless tobacco: Never Used  Substance Use Topics  . Alcohol use: Yes    Alcohol/week: 1.0 standard drinks    Types: 1 Glasses of wine per week    Comment: 1 glass of wine 3 - 4 times per week   . Drug use: No     Allergies   Sulfa antibiotics   Review of Systems Review of Systems  Constitutional: Negative for chills and fever.  HENT: Negative for ear pain and sore throat.   Eyes: Negative for pain and visual disturbance.  Respiratory: Negative for cough and shortness of breath.   Cardiovascular: Positive for chest pain.  Negative for palpitations and orthopnea.  Gastrointestinal: Negative for abdominal pain and vomiting.  Genitourinary: Negative for dysuria and hematuria.  Musculoskeletal: Negative for arthralgias and back pain.  Skin: Negative for color change and rash.  Neurological: Negative for dizziness, seizures and syncope.  All other systems reviewed and are negative.    Physical Exam Updated Vital Signs  ED Triage Vitals  Enc Vitals Group     BP 05/02/19 0750 (!) 164/74     Pulse Rate 05/02/19 0750 73     Resp 05/02/19 0750 16     Temp 05/02/19 0750 (!) 97.3 F (36.3 C)     Temp Source 05/02/19 0750 Oral     SpO2 05/02/19 0750 100 %     Weight 05/02/19 0748 155 lb (70.3 kg)     Height 05/02/19 0748 5\' 10"  (1.778 m)  Head Circumference --      Peak Flow --      Pain Score 05/02/19 0748 5     Pain Loc --      Pain Edu? --      Excl. in [place]? --     Physical Exam Vitals signs and nursing note reviewed.  Constitutional:      General: He is not in acute distress.    Appearance: He is well-developed. He is not ill-appearing.  HENT:     Head: Normocephalic and atraumatic.  Eyes:     Conjunctiva/sclera: Conjunctivae normal.     Pupils: Pupils are equal, round, and reactive to light.  Neck:     Musculoskeletal: Normal range of motion and neck supple.  Cardiovascular:     Rate and Rhythm: Normal rate and regular rhythm.     Pulses:          Radial pulses are 2+ on the right side and 2+ on the left side.     Heart sounds: Normal heart sounds. No murmur.  Pulmonary:     Effort: Pulmonary effort is normal. No respiratory distress.     Breath sounds: Normal breath sounds. No decreased breath sounds or wheezing.  Abdominal:     Palpations: Abdomen is soft.     Tenderness: There is no abdominal tenderness.  Musculoskeletal:     Right lower leg: No edema.     Left lower leg: No edema.  Skin:    General: Skin is warm and dry.     Capillary Refill: Capillary refill takes less than 2  seconds.  Neurological:     General: No focal deficit present.     Mental Status: He is alert.      ED Treatments / Results  Labs (all labs ordered are listed, but only abnormal results are displayed) Labs Reviewed  BRAIN NATRIURETIC PEPTIDE - Abnormal; Notable for the following components:      Result Value   B Natriuretic Peptide 142.4 (*)    All other components within normal limits  SARS CORONAVIRUS 2 (HOSPITAL ORDER, [facility])  BASIC METABOLIC PANEL  CBC  TROPONIN I    EKG EKG Interpretation  Date/Time:  Friday May 02 2019 07:51:12 EDT Ventricular Rate:  68 PR Interval:    QRS Duration: 102 QT Interval:  410 QTC Calculation: 436 R Axis:   64 Text Interpretation:  Sinus rhythm Prolonged PR interval Consider left atrial enlargement Low voltage, precordial leads Confirmed by Lennice Sites [phone]) on 05/02/2019 7:57:43 AM Also confirmed by Lennice Sites [phone]), editor Philomena Doheny [phone])  on 05/02/2019 8:47:29 AM   Radiology Dg Chest Portable 1 View  Result Date: 05/02/2019 CLINICAL DATA:  85 year old with chest pain. EXAM: PORTABLE CHEST 1 VIEW COMPARISON:  01/06/2019 FINDINGS: Heart size is stable with post cardiac surgical changes including mitral valve surgery. Small scattered densities in the upper lungs, particularly on the right side, have minimally changed. No new airspace disease or consolidation. Heart size is normal. Negative for a pneumothorax. IMPRESSION: 1. No acute cardiopulmonary disease. 2. Stable chronic lung changes. Electronically Signed   By: Markus Daft M.D.   On: 05/02/2019 08:14    Procedures Procedures (including critical care time)  Medications Ordered in ED Medications  aspirin chewable tablet 324 mg (324 mg Oral Given 05/02/19 0808)  nitroGLYCERIN (NITROSTAT) SL tablet 0.4 mg (0.4 mg Sublingual Given 05/02/19 0808)  nitroGLYCERIN (NITROSTAT) SL tablet 0.4 mg (0.4 mg Sublingual Given 05/02/19  0824)     Initial  Impression / Assessment and Plan / ED Course  I have reviewed the triage vital signs and the nursing notes.  Pertinent labs & imaging results that were available during my care of the patient were reviewed by me and considered in my medical decision making (see chart for details).     Jack James is an 85 year old male history of CAD status post CABG, high cholesterol, hypertension who presents to the ED with chest pain.  Patient with unremarkable vitals.  No fever.  Patient with intermittent chest pain for the last 2 weeks with exertion.  At first he thought it was secondary to new exercises but as he has stopped doing those exercises he continues to have intermittent chest pressure with exertion.  Denies any radiation.  Has some mild chest pain now at rest.  No diaphoresis.  Overall he looks comfortable.  No shortness of breath, cough, fever, abdominal pain.  Good pulses throughout.  Clear breath sounds.  No concern for PE or dissection given history and physical.  Concern for anginal type pain.  Patient has had cardiac catheterization back in 2016 but has been relatively chest pain-free since.  Will obtain lab work including troponin, chest x-ray.  EKG shows sinus rhythm.  He does have first-degree block.  Otherwise no new ischemic changes.  Will give aspirin and nitroglycerin.  Likely will need admission for chest pain rule out and will get coronavirus testing. Will consult cardiology with whom he follows with closely.  Patient with no significant anemia, electrolyte abnormality, kidney injury.  Patient with improvement in pain following nitroglycerin x2.  Patient with initial troponin normal.  BNP mildly elevated.  Chest x-ray without any signs of pneumonia, pneumothorax, pleural effusion.  Patient with negative coronavirus testing.  Given concerning chest pain symptoms and patient with significant cardiac history will admit to medicine for further care.  Will make cardiology aware of the patient  as well.  This chart was dictated using voice recognition software.  Despite best efforts to proofread,  errors can occur which can change the documentation meaning.    Final Clinical Impressions(s) / ED Diagnoses   Final diagnoses:  Chest pain, unspecified type    ED Discharge Orders    None       Lennice Sites, DO 05/02/19 [place], [place], DO 05/02/19 1027

## 2019-05-02 NOTE — ED Notes (Signed)
Dr. Ronnald Nian made aware of mild improvement in patient's pain with 1 sl nitro, order placed by MD for 2nd sl nitro

## 2019-05-02 NOTE — ED Triage Notes (Signed)
Pt here with c/o center chest pain times 2 weeks non radiating , no sob or n/v ,

## 2019-05-02 NOTE — Telephone Encounter (Signed)
Dr. Agustin Cree also made aware and no further recommendations at this time.

## 2019-05-02 NOTE — H&P (Addendum)
Date: 05/02/2019               Patient Name:  Jack James MRN: [id number]  DOB: 02/07/1934 Age / Sex: 85 y.o., male   PCP: Derinda Late, MD         Medical Service: Internal Medicine Teaching Service         Attending Physician: Dr. Lucious Groves, DO    First Contact: Dr. Sharon Seller  Pager: [phone]  Second Contact: Dr. Frederico Hamman Pager: [phone]       After Hours (After 5p/  First Contact Pager: [phone]  weekends / holidays): Second Contact Pager: [phone]   Chief Complaint: Chest pain  History of Present Illness: Patient is a very pleasant  85 yo M with a past medical history of CAD s/p PCI of LAD in 2011 followed by CABG and MVR in 2016 for severe MR with MVP, atrial fibrillation and stroke presenting with few weeks of intermittent chest pain. At baseline, patient is active and highly functional. Prior to coronavirus shutdown, patient was exercising at his gym three times a week on a treadmill. Since being at home and self isolating, he now walks outside twice a day. He also performs regular yard work and labor intensive chores around the house. Over the past few weeks, he began experiencing chest pain worse with exertion. He describes the pain as a central pressure and tightness. The pain improves with rest, but persists. Pain is now pretty much constant throughout the day but waxes and wanes in severity. He also reports shortness of breath when walking up hills during his daily walks and is no longer able to perform is usually yard work due to fatigue. He denies lower extremity swelling, weight gain, and orthopnea. He sleeps flat on his side. No fever, cough, or recent travel.   On arrival to the ED, patient was afebrile T 97.59F and hypertensive with BP 164/74, HR 73, RR 16, and oxygen 100% on RA. EKG was NSR without acute ischemia, first degree AV block, consistent with prior tracings. Initial troponin < 0.03. CBC and BMP within normal limits. CXR unremarkable. He was given 2 SL  nitroglycerin with significant improvement in his CP.   Meds:  Current Meds  Medication Sig  . amoxicillin (AMOXIL) 500 MG capsule Take 2,000 mg by mouth See admin instructions. One hour prior to dental work  . atorvastatin (LIPITOR) 20 MG tablet Take 20 mg by mouth daily.  . cholecalciferol (VITAMIN D) 1000 UNITS tablet Take 2,000 Units by mouth 2 (two) times daily.   . clopidogrel (PLAVIX) 75 MG tablet Take 75 mg by mouth daily.   . famotidine (PEPCID) 10 MG tablet Take 10 mg by mouth as needed for heartburn or indigestion.  . hyoscyamine (LEVSIN SL) 0.125 MG SL tablet Place 0.125 mg under the tongue every 6 (six) hours as needed for cramping.   . metoprolol tartrate (LOPRESSOR) 25 MG tablet Take 25 mg by mouth 2 (two) times daily.   . montelukast (SINGULAIR) 10 MG tablet Take 10 mg by mouth at bedtime.     Allergies: Allergies as of 05/02/2019 - Review Complete 05/02/2019  Allergen Reaction Noted  . Sulfa antibiotics Rash 11/26/2015   Past Medical History:  Diagnosis Date  . Acute on chronic diastolic heart failure ([place]) 11/19/2015  . Atrial fibrillation ([place]) 01/20/2016   postoperative   . CAD (coronary artery disease), native coronary artery 08/04/2010   Cath 2011 with severe proximal LAD stenosis treated with  2.75 x 24 mm ion stent postdilated to 3.0 mm by Dr. Burt Knack  Catheterization 2016 shows 40% ostial LAD, patent stent, 50% mid LAD, 60% circumflex, and 40% RCA stenosis with severe mitral regurgitation.   . Chronic diastolic CHF (congestive heart failure) ([place])   . Coronary artery disease    a. 2011: DES to proximal LAD  . HLD (hyperlipidemia)   . Hyperlipidemia   . Mitral regurgitation 11/19/2015  . Mitral valve prolapse   . MVP (mitral valve prolapse) 11/19/2015  . Pleural effusion, bilateral 11/18/2015  . 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 S/P mitral valve repair 11/25/2015   Complex valvuloplasty including  quadrangular resection of posterior leaflet, artificial Gore-tex neochord placement x10 and 32 mm Sorin Memo 3D Rechord ring annuloplasty  . Skin cancer   . Stroke [facility])     Family History:  Family History  Problem Relation Age of Onset  . Heart failure Mother   . Stroke Sister   . Lung cancer Brother      Social History: Retired, previously worked at a bank. Lives alone. Has been self isolating for the past two months. Never smoker. Drink 1 glass of wine a night.   Review of Systems: A complete ROS was negative except as per HPI.   Physical Exam: Blood pressure (!) 106/59, pulse 62, temperature (!) 97.3 F (36.3 C), temperature source Oral, resp. rate 15, height 5\' 10"  (1.778 m), weight 70.3 kg, SpO2 98 %. Constitutional: NAD, appears comfortable HEENT: Atraumatic, normocephalic. PERRL, anicteric sclera.  Neck: Supple, trachea midline.  Cardiovascular: RRR, no murmurs, rubs, or gallops.  Pulmonary/Chest: CTAB, no wheezes, rales, or rhonchi. Central sternotomy scar well healed.  Abdominal: Soft, non tender, non distended. +BS.  Extremities: Warm and well perfused. No edema.  Psychiatric: Normal mood and affect   EKG: personally reviewed my interpretation is: NSR, low voltage, normal axis, first degree AV block  CXR: personally reviewed my interpretation is: Post surgical changes including sternotomy wires & MVR, no acute cardiopulmonary disease, no effusions but image cut off at the right lung base  Assessment & Plan by Problem: Principal Problem:   Unstable angina ([place])  85 yo M with pmhx of CAD s/p CABG x3 and MVR presenting with intermittent chest pressure worse with exertion but now constant at rest. Initial trop < 0.03 and EKG non ischemic.   Chest Pain: Most likely unstable angina, but will rule out other ACS with serial troponins. Patient has not undergone repeat LHC since his CABG in 2016. Has been doing well up until a few weeks ago when he developed exertional  chest pain that is now constant at rest. Improved with nitroglycerin.  -- SL nitro q5 mins prn  -- Trend trops q6h  -- Telemetry  -- AM EKG -- Continue home plavix and metoprolol 25 mg BID -- Cardiology consult   Hx Paroxsymal Atrial Fibrillation: Per cardiology note, episode was perioperative. Patient says he has not had a-fib since. Currently NSR. Underwent 2 week ambulatory heart monitor in January without arrhythmia. Not currently on anticoagulation. -- Telemetry -- Continue home metoprolol   Hx Severe MR and MVP s/p repair: No murmur appreciated, but given symptoms of associated SOB and fatigue, will obtain repeat echocardiogram.  --F/u echo   HLD: Last lipid panel in January of 2020 with LDL 53.  -- Continue home atorvastatin 20 mg   FEN: No fluids, replete  lytes prn, HH diet, NPO @ MN VTE ppx: Lovenox  Code Status: FULL   Dispo: Admit patient to Observation with expected length of stay less than 2 midnights.  SignedVelna Ochs, MD 05/02/2019, 12:11 PM  Pager: [phone]

## 2019-05-02 NOTE — Progress Notes (Signed)
  Echocardiogram 2D Echocardiogram has been performed.  Jack James 05/02/2019, 2:20 PM

## 2019-05-02 NOTE — Telephone Encounter (Signed)
Saw this on 05/02/2019 3:48 pm. Patient is admitted to [facility] currently. Will forward to Dr. Bettina Gavia for him to review when he returns Monday.

## 2019-05-03 DIAGNOSIS — Z79899 Other long term (current) drug therapy: Secondary | ICD-10-CM

## 2019-05-03 DIAGNOSIS — E785 Hyperlipidemia, unspecified: Secondary | ICD-10-CM

## 2019-05-03 DIAGNOSIS — E782 Mixed hyperlipidemia: Secondary | ICD-10-CM

## 2019-05-03 DIAGNOSIS — I052 Rheumatic mitral stenosis with insufficiency: Secondary | ICD-10-CM | POA: Diagnosis not present

## 2019-05-03 DIAGNOSIS — I480 Paroxysmal atrial fibrillation: Secondary | ICD-10-CM

## 2019-05-03 DIAGNOSIS — I25700 Atherosclerosis of coronary artery bypass graft(s), unspecified, with unstable angina pectoris: Secondary | ICD-10-CM | POA: Diagnosis not present

## 2019-05-03 DIAGNOSIS — I25118 Atherosclerotic heart disease of native coronary artery with other forms of angina pectoris: Secondary | ICD-10-CM

## 2019-05-03 DIAGNOSIS — Z7902 Long term (current) use of antithrombotics/antiplatelets: Secondary | ICD-10-CM

## 2019-05-03 DIAGNOSIS — I050 Rheumatic mitral stenosis: Secondary | ICD-10-CM | POA: Diagnosis not present

## 2019-05-03 DIAGNOSIS — Z952 Presence of prosthetic heart valve: Secondary | ICD-10-CM

## 2019-05-03 DIAGNOSIS — Z881 Allergy status to other antibiotic agents status: Secondary | ICD-10-CM

## 2019-05-03 DIAGNOSIS — Z1159 Encounter for screening for other viral diseases: Secondary | ICD-10-CM | POA: Diagnosis not present

## 2019-05-03 DIAGNOSIS — Z951 Presence of aortocoronary bypass graft: Secondary | ICD-10-CM

## 2019-05-03 DIAGNOSIS — I25119 Atherosclerotic heart disease of native coronary artery with unspecified angina pectoris: Secondary | ICD-10-CM

## 2019-05-03 DIAGNOSIS — Z882 Allergy status to sulfonamides status: Secondary | ICD-10-CM | POA: Diagnosis not present

## 2019-05-03 LAB — BASIC METABOLIC PANEL
Anion gap: 7 (ref 5–15)
BUN: 14 mg/dL (ref 8–23)
CO2: 27 mmol/L (ref 22–32)
Calcium: 8.8 mg/dL — ABNORMAL LOW (ref 8.9–10.3)
Chloride: 101 mmol/L (ref 98–111)
Creatinine, Ser: 0.83 mg/dL (ref 0.61–1.24)
GFR calc Af Amer: >60 mL/min (ref >60)
GFR calc non Af Amer: >60 mL/min (ref >60)
Glucose, Bld: 94 mg/dL (ref 70–99)
Potassium: 4.3 mmol/L (ref 3.5–5.1)
Sodium: 135 mmol/L (ref 135–145)

## 2019-05-03 LAB — CBC
HCT: 39 % (ref 39.0–52.0)
Hemoglobin: 12.6 g/dL — ABNORMAL LOW (ref 13.0–17.0)
MCH: 31.5 pg (ref 26.0–34.0)
MCHC: 32.3 g/dL (ref 30.0–36.0)
MCV: 97.5 fL (ref 80.0–100.0)
Platelets: 176 10*3/uL (ref 150–400)
RBC: 4 MIL/uL — ABNORMAL LOW (ref 4.22–5.81)
RDW: 12 % (ref 11.5–15.5)
WBC: 4.9 10*3/uL (ref 4.0–10.5)
nRBC: 0 % (ref 0.0–0.2)

## 2019-05-03 MED ORDER — ATORVASTATIN CALCIUM 40 MG PO TABS: 40.0000 mg | ORAL_TABLET | Freq: Every day | ORAL | 0 refills | Status: DC

## 2019-05-03 MED ORDER — ISOSORBIDE MONONITRATE ER 30 MG PO TB24: 30.0000 mg | ORAL_TABLET | Freq: Every day | ORAL | 0 refills | Status: DC

## 2019-05-03 MED ORDER — ISOSORBIDE MONONITRATE ER 30 MG PO TB24
30.0000 mg | ORAL_TABLET | Freq: Every day | ORAL | Status: DC
  Administered 2019-05-03: 30 mg via ORAL
  Filled 2019-05-03: qty 1

## 2019-05-03 MED ORDER — ATORVASTATIN CALCIUM 40 MG PO TABS
40.0000 mg | ORAL_TABLET | Freq: Every day | ORAL | Status: DC
  Administered 2019-05-03: 40 mg via ORAL
  Filled 2019-05-03: qty 1

## 2019-05-03 MED ORDER — NITROGLYCERIN 0.4 MG SL SUBL: 0.4000 mg | SUBLINGUAL_TABLET | SUBLINGUAL | 0 refills | Status: DC | PRN

## 2019-05-03 NOTE — Discharge Summary (Signed)
Name: Jack James MRN: [id number] DOB: [birth year] 85 y.o. PCP: Derinda Late, MD  Date of Admission: 05/02/2019  7:43 AM Date of Discharge: 05/03/2019 Attending Physician: No att. providers found  Discharge Diagnosis: 1. Anginal Chest Pain  Discharge Medications: Allergies as of 05/03/2019      Reactions   Sulfa Antibiotics Rash      Medication List    TAKE these medications   amoxicillin 500 MG capsule Commonly known as:  AMOXIL Take 2,000 mg by mouth See admin instructions. One hour prior to dental work   atorvastatin 40 MG tablet Commonly known as:  LIPITOR Take 1 tablet (40 mg total) by mouth daily. What changed:    medication strength  how much to take   cholecalciferol 1000 units tablet Commonly known as:  VITAMIN D Take 2,000 Units by mouth 2 (two) times daily.   clopidogrel 75 MG tablet Commonly known as:  PLAVIX Take 75 mg by mouth daily.   famotidine 10 MG tablet Commonly known as:  PEPCID Take 10 mg by mouth as needed for heartburn or indigestion.   hyoscyamine 0.125 MG SL tablet Commonly known as:  LEVSIN SL Place 0.125 mg under the tongue every 6 (six) hours as needed for cramping.   isosorbide mononitrate 30 MG 24 hr tablet Commonly known as:  IMDUR Take 1 tablet (30 mg total) by mouth daily.   metoprolol tartrate 25 MG tablet Commonly known as:  LOPRESSOR Take 25 mg by mouth 2 (two) times daily.   montelukast 10 MG tablet Commonly known as:  SINGULAIR Take 10 mg by mouth at bedtime.   nitroGLYCERIN 0.4 MG SL tablet Commonly known as:  NITROSTAT Place 1 tablet (0.4 mg total) under the tongue every 5 (five) minutes as needed for chest pain.       Disposition and follow-up:   [name] was discharged from [facility] in Stable condition.  At the hospital follow up visit please address:  1.  Stable Angina: cardiac cath without serious obstruction or findings. Started on Imdur 30 mg for anginal chest pain.  Recommend follow-up myoview if symptoms do not improve with Imdur. lipitor was increased to 40 mg qd. Will follow-up with his cardiologist, Dr. Bettina Gavia.   2.  Labs / imaging needed at time of follow-up: none  3.  Pending labs/ test needing follow-up: none  Follow-up Appointments:   Hospital Course by problem list: Anginal Chest Pain Jack James is a 85 yo M with pmhx of CAD s/p CABG x3 and MVR for mitral regurge and prolapse, and paroxysmal atrial fibrillation on metoprolol who presented to the hospital with intermittent chest pressure worse with exertion that had increased to occurring at rest as well. Initial trop < 0.03 and EKG non ischemic. Cardiology was consulted for likely unstable angina. ECHO showed mild mitral stenosis and leaflet thickening but was otherwise unremarkable. he was taken to catheterization the same day as admission and was not found to have significant obstructive disease of any of the coronary arteries. It was recommended he follow-up with cardiology for possible myoview in the future. Overall, his pain was thought to to be stable angina, possibly chronotropic incompetence with his beta block. He was started on Imdur 30 mg, lipitor was increased to 40 mg qd, and he was continued on his other medications and will follow-up with his cardiologist, Dr. Bettina Gavia.   Discharge Vitals:   BP 132/81   Pulse 61   Temp 97.6 F (36.4  C) (Oral)   Resp 20   Ht 5\' 10"  (1.778 m)   Wt 68.6 kg   SpO2 98%   BMI 21.71 kg/m   Pertinent Labs, Studies, and Procedures:   Cardiac Catheterization 05/02/2019  Left heart cath, coronary angiography, bypass graft angiography including left internal mammary and left ventricular hemodynamic recordings via left radial approach using real-time vascular ultrasound for access.  Totally occluded mid RCA  Patent diffusely diseased LAD with up to 50 to 60% mid vessel narrowing.  LIMA is atretic but patent and has competitive flow into the distal  LAD.  The LAD between the first diagonal and the site of LIMA insertion contains moderate diffuse disease with a 50 to 70% narrowing near a small second diagonal.  50% proximal circumflex.  Fills with competitive flow into 2 large obtuse marginal.  Widely patent left main  Patent saphenous vein graft to distal RCA.  The graft has 2 areas of dilatation proximally and in the mid vessel had site of vein valves.  Patent saphenous vein graft to the second obtuse marginal  LIMA to LAD patent but atretic with decreased flow and competitive flow with native LAD.  Low LVEDP.  The patient does not have obvious high-grade obstructive disease to account for anginal complaints.  I am somewhat suspicious of the native mid LAD as it could be an intermediate geographic stenosis that is hemodynamically significant.  FFR/DFR unfortunately was not performed.  If continued symptoms, Lexiscan Myoview looking for evidence of apical ischemia would be helpful and guide treatment of the LAD native vessel.         ECHO 05/02/2019 1. The left ventricle has normal systolic function with an ejection fraction of 60-65%. The cavity size was normal. There is mildly increased left ventricular wall thickness. Left ventricular diastolic Doppler parameters are indeterminate.  2. The right ventricle has normal systolic function. The cavity was mildly enlarged. There is no increase in right ventricular wall thickness.  3. Left atrial size was mildly dilated.  4. Right atrial size was moderately dilated.  5. A ring repair valve is present in the mitral position.  6. Moderate thickening of the mitral valve leaflet. Mild mitral valve stenosis.  7. The aortic valve was not well visualized. No stenosis of the aortic valve.  8. The inferior vena cava was dilated in size with <50% respiratory variability.  9. The interatrial septum was not well visualized.  CXR 05/02/2019 IMPRESSION: 1. No acute cardiopulmonary disease. 2. Stable  chronic lung changes. Electronically Signed   By: Markus Daft M.D.   On: 05/02/2019 08:14   Discharge Instructions: Discharge Instructions    Diet - low sodium heart healthy   Complete by:  As directed    Discharge instructions   Complete by:  As directed    You were hospitalized for chest pain concerning for Angina. Thank you for allowing [place] to be part of your care.   Please follow-up with Cardiologist, Dr. Bettina Gavia, in the next 7-10 days. Their office should call you. Call [phone] if you do not hear from their office in the next few days.  Please also follow-up with your primary care physician within the next week.   Please note these changes made to your medications:   Please START taking isosorbide mononitrate (IMDUR) 30 MG 24 hr tablet - take one tablet by mouth daily   Please take atorvastatin (LIPITOR) 40 MG tablet - take one tablet by mouth daily     This  is increased from your previous 20 MG tablet - please stop taking atorvastatin (LIPITOR) 20 MG tablet  If you have chest pain, please take nitroglycerin (NITROSTAT) .4 MG SL tablet - if you have chest pain, please place one tablet below the tongue. If you continue to have chest pain, you can take another tablet after five minutes.   If you have severe chest pain or shortness of breath or chest pain that is not relieved by nitroglycerin or other concerning symptoms, please call 911 and return to the hospital.   Increase activity slowly   Complete by:  As directed       Signed: Molli Hazard A, DO 05/05/2019, 6:11 AM   Pager: [phone]

## 2019-05-03 NOTE — Discharge Instructions (Signed)
Radial Site Care ° °This sheet gives you information about how to care for yourself after your procedure. Your health care provider may also give you more specific instructions. If you have problems or questions, contact your health care provider. °What can I expect after the procedure? °After the procedure, it is common to have: °· Bruising and tenderness at the catheter insertion area. °Follow these instructions at home: °Medicines °· Take over-the-counter and prescription medicines only as told by your health care provider. °Insertion site care °· Follow instructions from your health care provider about how to take care of your insertion site. Make sure you: °? Wash your hands with soap and water before you change your bandage (dressing). If soap and water are not available, use hand sanitizer. °? Change your dressing as told by your health care provider. °? Leave stitches (sutures), skin glue, or adhesive strips in place. These skin closures may need to stay in place for 2 weeks or longer. If adhesive strip edges start to loosen and curl up, you may trim the loose edges. Do not remove adhesive strips completely unless your health care provider tells you to do that. °· Check your insertion site every day for signs of infection. Check for: °? Redness, swelling, or pain. °? Fluid or blood. °? Pus or a bad smell. °? Warmth. °· Do not take baths, swim, or use a hot tub until your health care provider approves. °· You may shower 24-48 hours after the procedure, or as directed by your health care provider. °? Remove the dressing and gently wash the site with plain soap and water. °? Pat the area dry with a clean towel. °? Do not rub the site. That could cause bleeding. °· Do not apply powder or lotion to the site. °Activity ° °· For 24 hours after the procedure, or as directed by your health care provider: °? Do not flex or bend the affected arm. °? Do not push or pull heavy objects with the affected arm. °? Do not  drive yourself home from the hospital or clinic. You may drive 24 hours after the procedure unless your health care provider tells you not to. °? Do not operate machinery or power tools. °· Do not lift anything that is heavier than 10 lb (4.5 kg), or the limit that you are told, until your health care provider says that it is safe. °· Ask your health care provider when it is okay to: °? Return to work or school. °? Resume usual physical activities or sports. °? Resume sexual activity. °General instructions °· If the catheter site starts to bleed, raise your arm and put firm pressure on the site. If the bleeding does not stop, get help right away. This is a medical emergency. °· If you went home on the same day as your procedure, a responsible adult should be with you for the first 24 hours after you arrive home. °· Keep all follow-up visits as told by your health care provider. This is important. °Contact a health care provider if: °· You have a fever. °· You have redness, swelling, or yellow drainage around your insertion site. °Get help right away if: °· You have unusual pain at the radial site. °· The catheter insertion area swells very fast. °· The insertion area is bleeding, and the bleeding does not stop when you hold steady pressure on the area. °· Your arm or hand becomes pale, cool, tingly, or numb. °These symptoms may represent a serious problem   that is an emergency. Do not wait to see if the symptoms will go away. Get medical help right away. Call your local emergency services (911 in the [place] Do not drive yourself to the hospital. Summary  After the procedure, it is common to have bruising and tenderness at the site.  Follow instructions from your health care provider about how to take care of your radial site wound. Check the wound every day for signs of infection.  Do not lift anything that is heavier than 10 lb (4.5 kg), or the limit that you are told, until your health care provider says  that it is safe. This information is not intended to replace advice given to you by your health care provider. Make sure you discuss any questions you have with your health care provider. Document Released: 01/13/2011 Document Revised: 01/16/2018 Document Reviewed: 01/16/2018 Elsevier Interactive Patient Education  2019 [place].    Heart-Healthy Eating Plan Heart-healthy meal planning includes:  Eating less unhealthy fats.  Eating more healthy fats.  Making other changes in your diet. Talk with your doctor or a diet specialist (dietitian) to create an eating plan that is right for you. What is my plan? Your doctor may recommend an eating plan that includes:  Total fat: ______% or less of total calories a day.  Saturated fat: ______% or less of total calories a day.  Cholesterol: less than _________mg a day. What are tips for following this plan? Cooking Avoid frying your food. Try to bake, boil, grill, or broil it instead. You can also reduce fat by:  Removing the skin from poultry.  Removing all visible fats from meats.  Steaming vegetables in water or broth. Meal planning   At meals, divide your plate into four equal parts: ? Fill one-half of your plate with vegetables and green salads. ? Fill one-fourth of your plate with whole grains. ? Fill one-fourth of your plate with lean protein foods.  Eat 4-5 servings of vegetables per day. A serving of vegetables is: ? 1 cup of raw or cooked vegetables. ? 2 cups of raw leafy greens.  Eat 4-5 servings of fruit per day. A serving of fruit is: ? 1 medium whole fruit. ?  cup of dried fruit. ?  cup of fresh, frozen, or canned fruit. ?  cup of 100% fruit juice.  Eat more foods that have soluble fiber. These are apples, broccoli, carrots, beans, peas, and barley. Try to get 20-30 g of fiber per day.  Eat 4-5 servings of nuts, legumes, and seeds per week: ? 1 serving of dried beans or legumes equals  cup after being  cooked. ? 1 serving of nuts is  cup. ? 1 serving of seeds equals 1 tablespoon. General information  Eat more home-cooked food. Eat less restaurant, buffet, and fast food.  Limit or avoid alcohol.  Limit foods that are high in starch and sugar.  Avoid fried foods.  Lose weight if you are overweight.  Keep track of how much salt (sodium) you eat. This is important if you have high blood pressure. Ask your doctor to tell you more about this.  Try to add vegetarian meals each week. Fats  Choose healthy fats. These include olive oil and canola oil, flaxseeds, walnuts, almonds, and seeds.  Eat more omega-3 fats. These include salmon, mackerel, sardines, tuna, flaxseed oil, and ground flaxseeds. Try to eat fish at least 2 times each week.  Check food labels. Avoid foods with trans fats or high  amounts of saturated fat.  Limit saturated fats. ? These are often found in animal products, such as meats, butter, and cream. ? These are also found in plant foods, such as palm oil, palm kernel oil, and coconut oil.  Avoid foods with partially hydrogenated oils in them. These have trans fats. Examples are stick margarine, some tub margarines, cookies, crackers, and other baked goods. What foods can I eat? Fruits All fresh, canned (in natural juice), or frozen fruits. Vegetables Fresh or frozen vegetables (raw, steamed, roasted, or grilled). Green salads. Grains Most grains. Choose whole wheat and whole grains most of the time. Rice and pasta, including brown rice and pastas made with whole wheat. Meats and other proteins Lean, well-trimmed beef, veal, pork, and lamb. Chicken and [place] without skin. All fish and shellfish. Wild duck, rabbit, pheasant, and venison. Egg whites or low-cholesterol egg substitutes. Dried beans, peas, lentils, and tofu. Seeds and most nuts. Dairy Low-fat or nonfat cheeses, including ricotta and mozzarella. Skim or 1% milk that is liquid, powdered, or evaporated.  Buttermilk that is made with low-fat milk. Nonfat or low-fat yogurt. Fats and oils Non-hydrogenated (trans-free) margarines. Vegetable oils, including soybean, sesame, sunflower, olive, peanut, safflower, corn, canola, and cottonseed. Salad dressings or mayonnaise made with a vegetable oil. Beverages Mineral water. Coffee and tea. Diet carbonated beverages. Sweets and desserts Sherbet, gelatin, and fruit ice. Small amounts of dark chocolate. Limit all sweets and desserts. Seasonings and condiments All seasonings and condiments. The items listed above may not be a complete list of foods and drinks you can eat. Contact a dietitian for more options. What foods should I avoid? Fruits Canned fruit in heavy syrup. Fruit in cream or butter sauce. Fried fruit. Limit coconut. Vegetables Vegetables cooked in cheese, cream, or butter sauce. Fried vegetables. Grains Breads that are made with saturated or trans fats, oils, or whole milk. Croissants. Sweet rolls. Donuts. High-fat crackers, such as cheese crackers. Meats and other proteins Fatty meats, such as hot dogs, ribs, sausage, bacon, rib-eye roast or steak. High-fat deli meats, such as salami and bologna. Caviar. Domestic duck and goose. Organ meats, such as liver. Dairy Cream, sour cream, cream cheese, and creamed cottage cheese. Whole-milk cheeses. Whole or 2% milk that is liquid, evaporated, or condensed. Whole buttermilk. Cream sauce or high-fat cheese sauce. Yogurt that is made from whole milk. Fats and oils Meat fat, or shortening. Cocoa butter, hydrogenated oils, palm oil, coconut oil, palm kernel oil. Solid fats and shortenings, including bacon fat, salt pork, lard, and butter. Nondairy cream substitutes. Salad dressings with cheese or sour cream. Beverages Regular sodas and juice drinks with added sugar. Sweets and desserts Frosting. Pudding. Cookies. Cakes. Pies. Milk chocolate or white chocolate. Buttered syrups. Full-fat ice cream or  ice cream drinks. The items listed above may not be a complete list of foods and drinks to avoid. Contact a dietitian for more information. Summary  Heart-healthy meal planning includes eating less unhealthy fats, eating more healthy fats, and making other changes in your diet.  Eat a balanced diet. This includes fruits and vegetables, low-fat or nonfat dairy, lean protein, nuts and legumes, whole grains, and heart-healthy oils and fats. This information is not intended to replace advice given to you by your health care provider. Make sure you discuss any questions you have with your health care provider. Document Released: 06/11/2012 Document Revised: 01/18/2018 Document Reviewed: 01/18/2018 Elsevier Interactive Patient Education  2019 [employer].

## 2019-05-03 NOTE — Progress Notes (Signed)
   Subjective:  Patient is feeling well this morning. Denies chest pain or shortness of breath. Patient called his daughter and we discussed potential causes of his chest pain and cath results.  Objective:  Vital signs in last 24 hours: Vitals:   05/02/19 1945 05/02/19 2042 05/02/19 2152 05/03/19 0339  BP: 119/63  (!) 114/58 98/64  Pulse: (!) 58  61 (!) 57  Resp:    20  Temp:  97.9 F (36.6 C)  97.6 F (36.4 C)  TempSrc:  Oral  Oral  SpO2: 100%   98%  Weight:    68.6 kg  Height:       Constitution: NAD, sitting up in bed  Cardio: bradycardia, no m/r/g  Respiratory: CTA, no w/r/r  Abdominal: + BS MSK: no LE edema, moving all extremities Neuro: a&o, normal affect  Assessment/Plan:  Principal Problem:   Unstable angina (HCC) Active Problems:   CAD (coronary artery disease), native coronary artery   Hyperlipidemia   MVP (mitral valve prolapse)   Chronic diastolic CHF (congestive heart failure) (HCC)   S/P CABG x 3  85 yo M with pmhx of CAD s/p CABG x3 and MVR presenting with intermittent chest pressure worse with exertion but now constant at rest. Initial trop < 0.03 and EKG non ischemic.   Unstable Angina Mild Mitral Stenosis with Hx of MVR 2016 2/2 regurge and prolapse HLD, CAD Trended troponins have remained normal. ECHO yesterday showed normal EF with mild mitral stenosis, catheterization done showed no extensive obstruction to account for his chest pain symptoms. Speaking with patient his symptoms - dyspnea, chest pain and tightness on exertion and when walking uphill and now at rest as well - it does sound anginal in nature. Patient did state that even when walking uphill and exercising his heart does not rise above 90s, which may also be a source of his dyspnea although it has been occurring at rest as well over the past few weeks. Additionally has mild mitral stenosis with history of MVR, which also may account for dyspnea and chest pain on exertion. Discussed with  cardiology and they recommend medical management at this time with Imdur and close follow-up with his cardiologist and possible stress testing in the future. Discussed with patient and daughter.   - start Imdur 30 mg qd  - increase lipitor to 40 mg qd  - continue plavix 75mg  qd, metoprolol 25 mg bid - nitroglycerin prn    VTE: heparin IVF: none Diet: heart healthy Code: full   Dispo: Anticipated discharge today.    Molli Hazard A, DO 05/03/2019, 5:40 AM Pager: [phone]

## 2019-05-03 NOTE — Progress Notes (Signed)
Progress Note  Patient Name: Jack James Date of Encounter: 05/03/2019  Primary Cardiologist: Shirlee More, MD  Subjective   No chest pain or shortness of breath.  I had a long discussion with the patient and also his daughter on the phone regarding cardiac catheterization findings and plan at this time.  Inpatient Medications    Scheduled Meds: . atorvastatin  20 mg Oral Daily  . cholecalciferol  2,000 Units Oral BID  . clopidogrel  75 mg Oral Daily  . heparin  5,000 Units Subcutaneous Q8H  . metoprolol tartrate  25 mg Oral BID  . montelukast  10 mg Oral QHS  . sodium chloride flush  3 mL Intravenous Q12H  . sodium chloride flush  3 mL Intravenous Q12H   Continuous Infusions: . sodium chloride     PRN Meds: sodium chloride, acetaminophen, famotidine, hyoscyamine, nitroGLYCERIN, ondansetron (ZOFRAN) IV, sodium chloride flush   Vital Signs    Vitals:   05/02/19 1945 05/02/19 2042 05/02/19 2152 05/03/19 0339  BP: 119/63  (!) 114/58 98/64  Pulse: (!) 58  61 (!) 57  Resp:    20  Temp:  97.9 F (36.6 C)  97.6 F (36.4 C)  TempSrc:  Oral  Oral  SpO2: 100%   98%  Weight:    68.6 kg  Height:       No intake or output data in the 24 hours ending 05/03/19 0800 Filed Weights   05/02/19 0748 05/03/19 0339  Weight: 70.3 kg 68.6 kg    Telemetry    Sinus rhythm.  Personally reviewed.  ECG    Tracing from 05/03/2019 shows sinus bradycardia with prolonged PR interval.  Personally reviewed.  Physical Exam   GEN:  Elderly male.  No acute distress.   Neck: No JVD. Cardiac: RRR, no murmur, rub, or gallop.  Respiratory: Nonlabored. Clear to auscultation bilaterally. GI: Soft, nontender, bowel sounds present. MS: No edema; No deformity. Neuro:  Nonfocal. Psych: Alert and oriented x 3. Normal affect.  Labs    Chemistry Recent Labs  Lab 05/02/19 0755  NA 136  K 3.7  CL 100  CO2 25  GLUCOSE 92  BUN 15  CREATININE 0.89  CALCIUM 9.4  GFRNONAA >60  GFRAA >60   ANIONGAP 11     Hematology Recent Labs  Lab 05/02/19 0755 05/03/19 0722  WBC 5.1 4.9  RBC 4.33 4.00*  HGB 13.7 12.6*  HCT 42.6 39.0  MCV 98.4 97.5  MCH 31.6 31.5  MCHC 32.2 32.3  RDW 11.9 12.0  PLT 184 176    Cardiac Enzymes Recent Labs  Lab 05/02/19 0755 05/02/19 1453  TROPONINI <0.03 <0.03   No results for input(s): TROPIPOC in the last 168 hours.   BNP Recent Labs  Lab 05/02/19 0755  BNP 142.4*     Radiology    Dg Chest Portable 1 View  Result Date: 05/02/2019 CLINICAL DATA:  85 year old with chest pain. EXAM: PORTABLE CHEST 1 VIEW COMPARISON:  01/06/2019 FINDINGS: Heart size is stable with post cardiac surgical changes including mitral valve surgery. Small scattered densities in the upper lungs, particularly on the right side, have minimally changed. No new airspace disease or consolidation. Heart size is normal. Negative for a pneumothorax. IMPRESSION: 1. No acute cardiopulmonary disease. 2. Stable chronic lung changes. Electronically Signed   By: Markus Daft M.D.   On: 05/02/2019 08:14    Cardiac Studies   Cardiac catheterization 05/02/2019:  Total occlusion of the mid RCA.  Widely patent  left main.  Patent LAD with diffuse 30% in-stent restenosis in the proximal to mid vessel.  Beyond the stent there is a region of diffuse disease in the 50 to 70% range.  The insertion of the LIMA and the distal vessel demonstrates competitive flow with an atretic appearing LIMA.  60% proximal circumflex with competitive flow due to a widely patent saphenous vein graft  Widely patent but aneurysmal saphenous vein graft to the distal right coronary.  Aneurysm proximally and in the mid vessel are at sites of valves.  No obvious thrombus is noted.  Widely patent saphenous vein graft to the second obtuse marginal.  Patent but diffusely atretic LIMA to LAD with competitive flow.  Recommendations:   Consider alternative explanations for the patient's dyspnea and chest  tightness.  The chest tightness seems to be worse with activity but is also present at rest and a milder degree.  Dyspnea on exertion seems unlikely related to left ventricular diastolic heart failure.  I do note mild mitral stenosis was noted on echo.  Perhaps at higher heart rates mitral stenosis could be contributing to dyspnea.  If angina continues to be a concern, consider Lexiscan Myoview looking for apical ischemia.  If noted, stent of the native LAD beyond the previously placed stent might be helpful, but based upon angiography today it did not seem to be needed.  Echocardiogram 05/02/2019:  1. The left ventricle has normal systolic function with an ejection fraction of 60-65%. The cavity size was normal. There is mildly increased left ventricular wall thickness. Left ventricular diastolic Doppler parameters are indeterminate.  2. The right ventricle has normal systolic function. The cavity was mildly enlarged. There is no increase in right ventricular wall thickness.  3. Left atrial size was mildly dilated.  4. Right atrial size was moderately dilated.  5. A ring repair valve is present in the mitral position.  6. Moderate thickening of the mitral valve leaflet. Mild mitral valve stenosis.  7. The aortic valve was not well visualized. No stenosis of the aortic valve.  8. The inferior vena cava was dilated in size with <50% respiratory variability.  9. The interatrial septum was not well visualized.  Patient Profile     85 y.o. male with a history of CAD s/p CABG and MVR in 2016 for severe MR with MVP, prior PCI of LAD in 2011, postoperative atrial fibrillation with no recurrence, HLD, HTN and CVA 2018  presenting with shortness of breath and chest tightness.  Assessment & Plan    1.  Multivessel CAD status post CABG.  Results as outlined above overall with moderate residual disease, patent but atretic LIMA with competitive flow in the distal LAD, patent SVG to RCA, patent SVG to OM 2.  No  ACS by enzymes and as per Dr. Thompson Caul report, no definitive culprit lesion to require PCI at this time, particularly without further trial of optimizing medical therapy.  2.  History of mitral valve prolapse with severe mitral regurgitation status post mitral valve repair in 2016 at the time of CABG.  3.  History of postoperative atrial fibrillation, no obvious recurrences.  4.  Essential hypertension.  5.  Mixed hyperlipidemia.  6.  History of stroke.  I reviewed hospital course, testing, and had extensive discussion with the patient and his daughter by phone.  Current regimen includes Plavix, Lipitor, and Lopressor.  His heart rate is well controlled at this time and he is in sinus rhythm.  Recommend initiation of Imdur 30  mg once daily as antianginal.  Anticipate discharge home with close outpatient follow-up with Dr. Bettina Gavia in the next 7 to 10 days.  If he continues to have dyspnea on exertion and angina on long-acting nitrate would then pursue a Myoview study with attention to the LAD distribution for potential PCI as noted by Dr. Tamala Julian.  I do wonder somewhat about whether he has a component of chronotropic incompetence leading to some of his exertional symptoms and whether in fact reducing his beta-blocker might actually be beneficial ultimately.  He has mild mitral stenosis by echocardiogram that I would not expect to be symptom provoking.  Signed, Rozann Lesches, MD  05/03/2019, 8:00 AM

## 2019-05-05 ENCOUNTER — Telehealth: Payer: Self-pay | Admitting: Cardiology

## 2019-05-05 ENCOUNTER — Encounter (HOSPITAL_COMMUNITY): Payer: Self-pay | Admitting: Interventional Cardiology

## 2019-05-05 NOTE — Telephone Encounter (Signed)
Patient is a Jack James f/up. I set him up as virtual on 5/21 but told him we would call him on 5/18 if he needed to come in as office visit instead. Please advise.

## 2019-05-06 ENCOUNTER — Telehealth: Payer: Self-pay | Admitting: Cardiology

## 2019-05-06 NOTE — Telephone Encounter (Signed)
New Message:   Pt was discharged from [facility] on 05-03-19. He was told to follow up in 7 to 10 days. Pt is a ppt of Dr Bettina Gavia, who is out this week. She wants to know if there is somebody she can talk to, see or have a Virtual Visit with this week? Daughter have some concerns about pt's condition.

## 2019-05-06 NOTE — Telephone Encounter (Signed)
Patients daughter Claiborne Billings) very concerned with dads persistent chest pain. He is having difficulties completing ADL's. Imdur is not working and unsure of what next step is.   RN called patient and received phone consent for telemedicine visit. Patient instructed to have home BP cuff available, weigh himself and all medication out for reconciliation/refills. Patient scheduled for virtual visit and had no further questions.

## 2019-05-07 NOTE — Telephone Encounter (Signed)
Please see previous phone encounter.

## 2019-05-08 ENCOUNTER — Telehealth (INDEPENDENT_AMBULATORY_CARE_PROVIDER_SITE_OTHER): Payer: Medicare Other | Admitting: Cardiology

## 2019-05-08 ENCOUNTER — Encounter: Payer: Self-pay | Admitting: Cardiology

## 2019-05-08 ENCOUNTER — Encounter: Payer: Self-pay | Admitting: *Deleted

## 2019-05-08 ENCOUNTER — Other Ambulatory Visit: Payer: Self-pay

## 2019-05-08 VITALS — Ht 70.0 in | Wt 155.0 lb

## 2019-05-08 DIAGNOSIS — R079 Chest pain, unspecified: Secondary | ICD-10-CM

## 2019-05-08 DIAGNOSIS — Z951 Presence of aortocoronary bypass graft: Secondary | ICD-10-CM

## 2019-05-08 DIAGNOSIS — E782 Mixed hyperlipidemia: Secondary | ICD-10-CM

## 2019-05-08 DIAGNOSIS — I2510 Atherosclerotic heart disease of native coronary artery without angina pectoris: Secondary | ICD-10-CM

## 2019-05-08 DIAGNOSIS — Z9889 Other specified postprocedural states: Secondary | ICD-10-CM

## 2019-05-08 MED ORDER — RANOLAZINE ER 500 MG PO TB12: 500.0000 mg | ORAL_TABLET | Freq: Two times a day (BID) | ORAL | 2 refills | Status: DC

## 2019-05-08 MED ORDER — NITROGLYCERIN 0.4 MG SL SUBL: 0.4000 mg | SUBLINGUAL_TABLET | SUBLINGUAL | 11 refills | Status: DC | PRN

## 2019-05-08 NOTE — Progress Notes (Signed)
Virtual Visit via Telephone Note   This visit type was conducted due to national recommendations for restrictions regarding the COVID-19 Pandemic (e.g. social distancing) in an effort to limit this patient's exposure and mitigate transmission in our community.  Due to his co-morbid illnesses, this patient is at least at moderate risk for complications without adequate follow up.  This format is felt to be most appropriate for this patient at this time.  The patient did not have access to video technology/had technical difficulties with video requiring transitioning to audio format only (telephone).  All issues noted in this document were discussed and addressed.  No physical exam could be performed with this format.  Please refer to the patient's chart for his  consent to telehealth for [facility].   Date:  05/08/2019   ID:  Jack James, DOB 1934/11/07, MRN [id number]  Patient Location: Home Provider Location: Office  PCP:  Derinda Late, MD  Cardiologist:  No primary care provider on file.  Electrophysiologist:  None   Evaluation Performed:  Follow-Up Visit  Chief Complaint: Follow-up post coronary angiography  History of Present Illness:    Jack James is a 85 y.o. male with past medical history of coronary artery disease, essential hypertension and mitral valve repair and CABG in the past.  Patient went to the hospital with chest pain and underwent coronary angiography which was largely unremarkable.  It is most mentioned that the distal LAD had stenosis which was not very convincing.  A Lexiscan sestamibi was suggested if the patient would continue to have symptoms.  At the time of my evaluation, the patient is alert awake oriented and in no distress.  The patient's daughter joined by phone by conferencing.  The patient mentions to me that he walks up to half a mile a day without any problems.  No chest pain orthopnea or PND.  He occasionally has chest discomfort and is not  related to exertion.  He is concerned about the symptoms.  The patient does not have symptoms concerning for COVID-19 infection (fever, chills, cough, or new shortness of breath).    Past Medical History:  Diagnosis Date   Acute on chronic diastolic heart failure ([place]) 11/19/2015   Atrial fibrillation ([place]) 01/20/2016   postoperative    CAD (coronary artery disease), native coronary artery 08/04/2010   Cath 2011 with severe proximal LAD stenosis treated with 2.75 x 24 mm ion stent postdilated to 3.0 mm by Dr. Burt Knack  Catheterization 2016 shows 40% ostial LAD, patent stent, 50% mid LAD, 60% circumflex, and 40% RCA stenosis with severe mitral regurgitation.    Chronic diastolic CHF (congestive heart failure) (HCC)    Coronary artery disease    a. 2011: DES to proximal LAD   HLD (hyperlipidemia)    Hyperlipidemia    Mitral regurgitation 11/19/2015   Mitral valve prolapse    MVP (mitral valve prolapse) 11/19/2015   Pleural effusion, bilateral 11/18/2015   S/P CABG x 3 11/25/2015   LIMA to LAD, SVG to RCA, SVG to OM2, EVH via right thigh    S/P mitral valve repair 11/25/2015   Complex valvuloplasty including quadrangular resection of posterior leaflet, artificial Gore-tex neochord placement x10 and 32 mm Sorin Memo 3D Rechord ring annuloplasty   Skin cancer    Stroke [facility])    Past Surgical History:  Procedure Laterality Date   CARDIAC CATHETERIZATION N/A 11/19/2015   Procedure: Left Heart Cath and Coronary Angiography;  Surgeon: Burnell Blanks, MD;  Location: [place] [facility];  Service: Cardiovascular;  Laterality: N/A;   CATARACT EXTRACTION, BILATERAL  2017   CLIPPING OF ATRIAL APPENDAGE  11/25/2015   Procedure: CLIPPING OF ATRIAL APPENDAGE;  Surgeon: Rexene Alberts, MD;  Location: [place];  Service: Open Heart Surgery;;   CORONARY ARTERY BYPASS GRAFT N/A 11/25/2015   Procedure: CORONARY ARTERY BYPASS GRAFTING (CABG) TIMES THREE USING LEFT INTERNAL MAMMARY ARTERY  AND RIGHT SAPHENOUS LEG VEIN HARVESTED ENDOSCOPICALLY;  Surgeon: Rexene Alberts, MD;  Location: [place];  Service: Open Heart Surgery;  Laterality: N/A;   CORONARY STENT PLACEMENT  2011   DES to proximal LAD   LEFT HEART CATH AND CORS/GRAFTS ANGIOGRAPHY N/A 05/02/2019   Procedure: LEFT HEART CATH AND CORS/GRAFTS ANGIOGRAPHY;  Surgeon: Belva Crome, MD;  Location: [place] [facility];  Service: Cardiovascular;  Laterality: N/A;   LITHOTRIPSY     MITRAL VALVE REPAIR N/A 11/25/2015   Procedure: MITRAL VALVE REPAIR (MVR);  Surgeon: Rexene Alberts, MD;  Location: [place];  Service: Open Heart Surgery;  Laterality: N/A;   SKIN CANCER EXCISION     TEE WITHOUT CARDIOVERSION N/A 11/22/2015   Procedure: TRANSESOPHAGEAL ECHOCARDIOGRAM (TEE);  Surgeon: [name], MD;  Location: [facility] END[facility];  Service: Cardiovascular;  Laterality: N/A;   TEE WITHOUT CARDIOVERSION N/A 11/25/2015   Procedure: TRANSESOPHAGEAL ECHOCARDIOGRAM (TEE);  Surgeon: Rexene Alberts, MD;  Location: [place];  Service: Open Heart Surgery;  Laterality: N/A;     Current Meds  Medication Sig   amoxicillin (AMOXIL) 500 MG capsule Take 2,000 mg by mouth See admin instructions. One hour prior to dental work   atorvastatin (LIPITOR) 40 MG tablet Take 1 tablet (40 mg total) by mouth daily.   cholecalciferol (VITAMIN D) 1000 UNITS tablet Take 2,000 Units by mouth 2 (two) times daily.    clopidogrel (PLAVIX) 75 MG tablet Take 75 mg by mouth daily.    famotidine (PEPCID) 10 MG tablet Take 10 mg by mouth as needed for heartburn or indigestion.   hyoscyamine (LEVSIN SL) 0.125 MG SL tablet Place 0.125 mg under the tongue every 6 (six) hours as needed for cramping.    isosorbide mononitrate (IMDUR) 30 MG 24 hr tablet Take 1 tablet (30 mg total) by mouth daily.   metoprolol tartrate (LOPRESSOR) 25 MG tablet Take 25 mg by mouth 2 (two) times daily.    montelukast (SINGULAIR) 10 MG tablet Take 10 mg by mouth at bedtime.   nitroGLYCERIN  (NITROSTAT) 0.4 MG SL tablet Place 1 tablet (0.4 mg total) under the tongue every 5 (five) minutes as needed for chest pain.     Allergies:   Sulfa antibiotics   Social History   Tobacco Use   Smoking status: Never Smoker   Smokeless tobacco: Never Used  Substance Use Topics   Alcohol use: Yes    Alcohol/week: 1.0 standard drinks    Types: 1 Glasses of wine per week    Comment: 1 glass of wine 3 - 4 times per week    Drug use: No     Family Hx: The patient's family history includes Heart failure in his mother; Lung cancer in his brother; Stroke in his sister.  ROS:   Please see the history of present illness.    As mentioned above All other systems reviewed and are negative.   Prior CV studies:   The following studies were reviewed today:  Hospitalization and coronary angiography reports were reviewed in detail  Labs/Other Tests and  Data Reviewed:    EKG:  Sinus rhythm, left atrial enlargement and nonspecific ST-T changes done on May 02, 2019  Recent Labs: 01/06/2019: ALT 20 05/02/2019: B Natriuretic Peptide 142.4 05/03/2019: BUN 14; Creatinine, Ser 0.83; Hemoglobin 12.6; Platelets 176; Potassium 4.3; Sodium 135   Recent Lipid Panel Lab Results  Component Value Date/Time   CHOL 140 01/06/2019 11:21 AM   TRIG 49 01/06/2019 11:21 AM   HDL 77 01/06/2019 11:21 AM   CHOLHDL 1.8 01/06/2019 11:21 AM   CHOLHDL 2.1 11/20/2015 12:30 AM   LDLCALC 53 01/06/2019 11:21 AM    Wt Readings from Last 3 Encounters:  05/08/19 155 lb (70.3 kg)  05/03/19 151 lb 4.8 oz (68.6 kg)  01/06/19 158 lb 12.8 oz (72 kg)     Objective:    Vital Signs:  Ht 5\' 10"  (1.778 m)    Wt 155 lb (70.3 kg)    BMI 22.24 kg/m    VITAL SIGNS:  reviewed  ASSESSMENT & PLAN:    1. Coronary artery disease: Secondary prevention stressed with the patient.  Importance of compliance with diet and medication stressed and he vocalized understanding.  I told him to keep a track of his blood pressures and he  will buy a machine and keep a track of his blood pressures and give them to [place].  In view of his symptoms I reassured him about my findings.  I will initiate him on Ranexa 500 mg twice daily.  We will set him up for a Lexiscan sestamibi in the next week or 2.  When he comes in I would like to get an EKG on him because of initiating Ranexa.  Patient will also bring me blood pressure readings when he comes for the stress test.  Sublingual nitroglycerin prescription was sent, its protocol and 911 protocol explained and the patient vocalized understanding questions were answered to the patient's satisfaction 2. Sublingual nitroglycerin prescription was sent, its protocol and 911 protocol explained and the patient vocalized understanding questions were answered to the patient's satisfaction. 3. Essential hypertension: He will keep a track of his blood pressures and heart rate and send it to me or bring it when he comes for the stress test. 4. Mixed dyslipidemia: Diet was discussed.  Patient and daughter had multiple questions which were answered to their satisfaction.  COVID-19 Education: The signs and symptoms of COVID-19 were discussed with the patient and how to seek care for testing (follow up with PCP or arrange E-visit).  The importance of social distancing was discussed today.  Time:   Today, I have spent 25 minutes with the patient with telehealth technology discussing the above problems.     Medication Adjustments/Labs and Tests Ordered: Current medicines are reviewed at length with the patient today.  Concerns regarding medicines are outlined above.   Tests Ordered: No orders of the defined types were placed in this encounter.   Medication Changes: No orders of the defined types were placed in this encounter.   Disposition:  Follow up in 1 month(s)  Signed, Jenean Lindau, MD  05/08/2019 10:14 AM    [place]

## 2019-05-08 NOTE — Patient Instructions (Addendum)
Medication Instructions:  Your physician has recommended you make the following change in your medication:  When having chest pain, stop what you are doing and sit down. Take 1 nitro, wait 5 minutes. Still having chest pain, take 1 nitro, wait 5 minutes. Still having chest pain, take 1 nitro, dial 911. Total of 3 nitro in 15 minutes.  START: RANEXA 500 mg (1 tab) twice daily   If you need a refill on your cardiac medications before your next appointment, please call your pharmacy.   Lab work: None If you have labs (blood work) drawn today and your tests are completely normal, you will receive your results only by:  [place] (if you have MyChart) OR  A paper copy in the mail If you have any lab test that is abnormal or we need to change your treatment, we will call you to review the results.  Testing/Procedures: Your physician has requested that you have a lexiscan myoview. For further information please visit [email or web address]. Please follow instruction sheet, as given.    [facility] [address] [place], [place] [address] Phone:  [phone]  May 08, 2019    Jack James DOB: August 22, 1934 MRN: [id number] [address] [place] [address]   Dear Mr. Marken,  You are scheduled for a Myocardial Perfusion Imaging Study on:May 21,2020  at  8:00AM.  Please arrive 15 minutes prior to your appointment time for registration and insurance purposes.  The test will take approximately 3 to 4 hours to complete; you may bring reading material.  If someone comes with you to your appointment, they will need to remain in the main lobby due to limited space in the testing area. **If you are pregnant or breastfeeding, please notify the nuclear lab prior to your appointment**  How to prepare for your Myocardial Perfusion Test:  Do not eat or drink 3 hours prior to your test, except you may have water.  Do not consume products containing caffeine  (regular or decaffeinated) 12 hours prior to your test. (ex: coffee, chocolate, sodas, tea). Do bring a list of your current medications with you.  If not listed below, you may take your medications as normal.  Do not take metoprolol (Lopressor, Toprol) for 24 hours prior to the test.  Bring the medication to your appointment as you may be required to take it once the test is complete.  Do wear comfortable clothes (no dresses or overalls) and walking shoes, tennis shoes preferred (No heels or open toe shoes are allowed).  Do NOT wear cologne, perfume, aftershave, or lotions (deodorant is allowed).  If these instructions are not followed, your test will have to be rescheduled.  Please report to [address] for your test.  If you have questions or concerns about your appointment, you can call the [facility] at [phone].  If you cannot keep your appointment, please provide 24 hours notification to the Nuclear Lab, to avoid a possible $50 charge to your account.  Follow-Up: At [facility], you and your health needs are our priority.  As part of our continuing mission to provide you with exceptional heart care, we have created designated Provider Care Teams.  These Care Teams include your primary Cardiologist (physician) and Advanced Practice Providers (APPs -  Physician Assistants and Nurse Practitioners) who all work together to provide you with the care you need, when you need it. You will need a follow up appointment  in 1 months.  Any Other Special Instructions Will Be Listed Below (If Applicable).     Ranolazine tablets, extended release What is this medicine? RANOLAZINE (ra NOE la zeen) is a heart medicine. It is used to treat chronic chest pain (angina). This medicine must be taken regularly. It will not relieve an acute episode of chest pain. This medicine may be used for other purposes; ask your health care provider or pharmacist if you  have questions. COMMON BRAND NAME(S): Ranexa What should I tell my health care provider before I take this medicine? They need to know if you have any of these conditions: -heart disease -irregular heartbeat -kidney disease -liver disease -low levels of potassium or magnesium in the blood -an unusual or allergic reaction to ranolazine, other medicines, foods, dyes, or preservatives -pregnant or trying to get pregnant -breast-feeding How should I use this medicine? Take this medicine by mouth with a glass of water. Follow the directions on the prescription label. Do not cut, crush, or chew this medicine. Take with or without food. Do not take this medication with grapefruit juice. Take your doses at regular intervals. Do not take your medicine more often then directed. Talk to your pediatrician regarding the use of this medicine in children. Special care may be needed. Overdosage: If you think you have taken too much of this medicine contact a poison control center or emergency room at once. NOTE: This medicine is only for you. Do not share this medicine with others. What if I miss a dose? If you miss a dose, take it as soon as you can. If it is almost time for your next dose, take only that dose. Do not take double or extra doses. What may interact with this medicine? Do not take this medicine with any of the following medications: -antivirals for HIV or AIDS -cerivastatin -certain antibiotics like chloramphenicol, clarithromycin, dalfopristin; quinupristin, isoniazid, rifabutin, rifampin, rifapentine -certain medicines used for cancer like imatinib, nilotinib -certain medicines for fungal infections like fluconazole, itraconazole, ketoconazole, posaconazole, voriconazole -certain medicines for irregular heart beat like dofetilide, dronedarone -certain medicines for seizures like carbamazepine, fosphenytoin, oxcarbazepine, phenobarbital,  phenytoin -cisapride -conivaptan -cyclosporine -grapefruit or grapefruit juice -lumacaftor; ivacaftor -nefazodone -pimozide -quinacrine -St John's wort -thioridazine -ziprasidone This medicine may also interact with the following medications: -alfuzosin -certain medicines for depression, anxiety, or psychotic disturbances like bupropion, citalopram, fluoxetine, fluphenazine, paroxetine, perphenazine, risperidone, sertraline, trifluoperazine -certain medicines for cholesterol like atorvastatin, lovastatin, simvastatin -certain medicines for stomach problems like octreotide, palonosetron, prochlorperazine -eplerenone -ergot alkaloids like dihydroergotamine, ergonovine, ergotamine, methylergonovine -metformin -nicardipine -other medicines that prolong the QT interval (cause an abnormal heart rhythm) -sirolimus -tacrolimus This list may not describe all possible interactions. Give your health care provider a list of all the medicines, herbs, non-prescription drugs, or dietary supplements you use. Also tell them if you smoke, drink alcohol, or use illegal drugs. Some items may interact with your medicine. What should I watch for while using this medicine? Visit your doctor for regular check ups. Tell your doctor or healthcare professional if your symptoms do not start to get better or if they get worse. This medicine will not relieve an acute attack of angina or chest pain. This medicine can change your heart rhythm. Your health care provider may check your heart rhythm by ordering an electrocardiogram (ECG) while you are taking this medicine. You may get drowsy or dizzy. Do not drive, use machinery, or do anything that needs mental alertness until you know how this medicine  affects you. Do not stand or sit up quickly, especially if you are an older patient. This reduces the risk of dizzy or fainting spells. Alcohol may interfere with the effect of this medicine. Avoid alcoholic drinks. If  you are scheduled for any medical or dental procedure, tell your healthcare provider that you are taking this medicine. This medicine can interact with other medicines used during surgery. What side effects may I notice from receiving this medicine? Side effects that you should report to your doctor or health care professional as soon as possible: -allergic reactions like skin rash, itching or hives, swelling of the face, lips, or tongue -breathing problems -changes in vision -fast, irregular or pounding heartbeat -feeling faint or lightheaded, falls -low or high blood pressure -numbness or tingling feelings -ringing in the ears -tremor or shakiness -slow heartbeat (fewer than 50 beats per minute) -swelling of the legs or feet Side effects that usually do not require medical attention (report to your doctor or health care professional if they continue or are bothersome): -constipation -drowsy -dry mouth -headache -nausea or vomiting -stomach upset This list may not describe all possible side effects. Call your doctor for medical advice about side effects. You may report side effects to FDA at 1-800-FDA-1088. Where should I keep my medicine? Keep out of the reach of children. Store at room temperature between 15 and 30 degrees C (59 and 86 degrees F). Throw away any unused medicine after the expiration date. NOTE: This sheet is a summary. It may not cover all possible information. If you have questions about this medicine, talk to your doctor, pharmacist, or health care provider.  2019 Elsevier/Gold Standard (2016-01-13 12:24:15)  PLEASE GET A BLOOD PRESSURE MACHINE. PLEASE CHECK DAILY AND RECORD.BRING TO LEXISCAN APPOINTMENT  Cardiac Nuclear Scan A cardiac nuclear scan is a test that is done to check the flow of blood to your heart. It is done when you are resting and when you are exercising. The test looks for problems such as:  Not enough blood reaching a portion of the  heart.  The heart muscle not working as it should. You may need this test if:  You have heart disease.  You have had lab results that are not normal.  You have had heart surgery or a balloon procedure to open up blocked arteries (angioplasty).  You have chest pain.  You have shortness of breath. In this test, a special dye (tracer) is put into your bloodstream. The tracer will travel to your heart. A camera will then take pictures of your heart to see how the tracer moves through your heart. This test is usually done at a hospital and takes 2-4 hours. Tell a doctor about:  Any allergies you have.  All medicines you are taking, including vitamins, herbs, eye drops, creams, and over-the-counter medicines.  Any problems you or family members have had with anesthetic medicines.  Any blood disorders you have.  Any surgeries you have had.  Any medical conditions you have.  Whether you are pregnant or may be pregnant. What are the risks? Generally, this is a safe test. However, problems may occur, such as:  Serious chest pain and heart attack. This is only a risk if the stress portion of the test is done.  Rapid heartbeat.  A feeling of warmth in your chest. This feeling usually does not last long.  Allergic reaction to the tracer. What happens before the test?  Ask your doctor about changing or stopping your normal  medicines. This is important.  Follow instructions from your doctor about what you cannot eat or drink.  Remove your jewelry on the day of the test. What happens during the test?  An IV tube will be inserted into one of your veins.  Your doctor will give you a small amount of tracer through the IV tube.  You will wait for 20-40 minutes while the tracer moves through your bloodstream.  Your heart will be monitored with an electrocardiogram (ECG).  You will lie down on an exam table.  Pictures of your heart will be taken for about 15-20 minutes.  You  may also have a stress test. For this test, one of these things may be done: ? You will be asked to exercise on a treadmill or a stationary bike. ? You will be given medicines that will make your heart work harder. This is done if you are unable to exercise.  When blood flow to your heart has peaked, a tracer will again be given through the IV tube.  After 20-40 minutes, you will get back on the exam table. More pictures will be taken of your heart.  Depending on the tracer that is used, more pictures may need to be taken 3-4 hours later.  Your IV tube will be removed when the test is over. The test may vary among doctors and hospitals. What happens after the test?  Ask your doctor: ? Whether you can return to your normal schedule, including diet, activities, and medicines. ? Whether you should drink more fluids. This will help to remove the tracer from your body. Drink enough fluid to keep your pee (urine) pale yellow.  Ask your doctor, or the department that is doing the test: ? When will my results be ready? ? How will I get my results? Summary  A cardiac nuclear scan is a test that is done to check the flow of blood to your heart.  Tell your doctor whether you are pregnant or may be pregnant.  Before the test, ask your doctor about changing or stopping your normal medicines. This is important.  Ask your doctor whether you can return to your normal activities. You may be asked to drink more fluids. This information is not intended to replace advice given to you by your health care provider. Make sure you discuss any questions you have with your health care provider. Document Released: 05/27/2018 Document Revised: 05/27/2018 Document Reviewed: 05/27/2018 Elsevier Interactive Patient Education  2019 [employer].

## 2019-05-08 NOTE — Addendum Note (Signed)
Addended by: Particia Nearing B on: 05/08/2019 11:05 AM   Modules accepted: Orders

## 2019-05-12 ENCOUNTER — Telehealth: Payer: Medicare Other | Admitting: Cardiology

## 2019-05-13 ENCOUNTER — Telehealth: Payer: Self-pay | Admitting: *Deleted

## 2019-05-13 NOTE — Telephone Encounter (Signed)
Patient given detailed instructions per Myocardial Perfusion Study Information Sheet for the test on 05/15/19. Patient notified to arrive 15 minutes early and that it is imperative to arrive on time for appointment to keep from having the test rescheduled.  If you need to cancel or reschedule your appointment, please call the office within 24 hours of your appointment. . Patient verbalized understanding. Jack James Jacqueline    

## 2019-05-15 ENCOUNTER — Other Ambulatory Visit: Payer: Self-pay

## 2019-05-15 ENCOUNTER — Ambulatory Visit (INDEPENDENT_AMBULATORY_CARE_PROVIDER_SITE_OTHER): Payer: Medicare Other

## 2019-05-15 ENCOUNTER — Telehealth: Payer: Medicare Other | Admitting: Cardiology

## 2019-05-15 DIAGNOSIS — R079 Chest pain, unspecified: Secondary | ICD-10-CM | POA: Diagnosis not present

## 2019-05-15 LAB — MYOCARDIAL PERFUSION IMAGING
LV dias vol: 96 mL (ref 62–150)
LV sys vol: 38 mL
Peak HR: 79 {beats}/min
Rest HR: 56 {beats}/min
SDS: 3
SRS: 2
SSS: 5
TID: 0.89

## 2019-05-15 MED ORDER — TECHNETIUM TC 99M TETROFOSMIN IV KIT
26.7000 | PACK | Freq: Once | INTRAVENOUS | Status: AC | PRN
  Administered 2019-05-15: 26.7 via INTRAVENOUS

## 2019-05-15 MED ORDER — TECHNETIUM TC 99M TETROFOSMIN IV KIT
8.7000 | PACK | Freq: Once | INTRAVENOUS | Status: AC | PRN
  Administered 2019-05-15: 8.7 via INTRAVENOUS

## 2019-05-15 MED ORDER — REGADENOSON 0.4 MG/5ML IV SOLN
0.4000 mg | Freq: Once | INTRAVENOUS | Status: AC
  Administered 2019-05-15: 0.4 mg via INTRAVENOUS

## 2019-05-16 ENCOUNTER — Telehealth: Payer: Self-pay

## 2019-05-16 ENCOUNTER — Telehealth: Payer: Self-pay | Admitting: Cardiology

## 2019-05-16 NOTE — Telephone Encounter (Signed)
What is the next step to find out what is causing his chest pains

## 2019-05-16 NOTE — Telephone Encounter (Signed)
Patient called and notified of test results. 

## 2019-05-16 NOTE — Telephone Encounter (Signed)
-----   Message from Jenean Lindau, MD sent at 05/16/2019  8:44 AM EDT ----- The results of the study is unremarkable. Please inform patient. I will discuss in detail at next appointment. Cc  primary care/referring physician Jenean Lindau, MD 05/16/2019 8:44 AM

## 2019-05-16 NOTE — Telephone Encounter (Signed)
Needs to speak to pcp. And also will discuss at next appt

## 2019-05-22 MED ORDER — ISOSORBIDE MONONITRATE ER 30 MG PO TB24: 30.0000 mg | ORAL_TABLET | Freq: Every day | ORAL | 2 refills | Status: DC

## 2019-05-22 MED ORDER — RANOLAZINE ER 1000 MG PO TB12: 1000.0000 mg | ORAL_TABLET | Freq: Two times a day (BID) | ORAL | 2 refills | Status: DC

## 2019-05-22 NOTE — Addendum Note (Signed)
Addended by: Beckey Rutter on: 05/22/2019 11:36 AM   Modules accepted: Orders

## 2019-05-22 NOTE — Telephone Encounter (Signed)
Patient called wanting to talk directly to Dr. Docia Furl. Per discussion ranexa increased to 1000 mg BID and refill for imdur 30 mg sent to [facility]. Patient scheduled for in office f/u visit to discuss chest pains.

## 2019-05-25 ENCOUNTER — Other Ambulatory Visit: Payer: Self-pay | Admitting: Internal Medicine

## 2019-06-03 NOTE — Progress Notes (Signed)
Office visit  Date:  06/04/2019   ID:  Jack James, DOB [birth year], MRN [id number]  Patient Location: Home Provider Location: Office  PCP:  Derinda Late, MD Cardiologist:  Shirlee More, MD  Electrophysiologist:  None   Evaluation Performed:  Follow-Up Visit  Chief Complaint:  CAD  History of Present Illness:    a hx of CAD with PCI of LAD 2011, CABG and MVR in 2016 for severe MR with MVP,hyperlipidemia and stroke  last seen 01/06/19.  Time his last visit there was also discussion of having multiple lung nodules.  There is a notation may have a repeat scan performed in 6 months.  He had an echocardiogram 09/21/2017 showing ejection fraction 50 to 55% normal response to mitral valve repair and annuloplasty ring mild pulmonary hypertension moderate to severe right atrial and moderate left atrial enlargement.Marland Kitchen   He was seen on consultation by Dr. Gabriela Eves [facility] 05/02/2019 after he was admitted to the hospital with shortness of breath and exertional chest pain.  He underwent left heart catheterization he had 70% mid LAD disease with a patent left thoracic artery although it is a atretic saphenous vein graft was patent to the distal right coronary artery with total occlusion of the right coronary artery saphenous vein graft was patent to left circumflex and 60% proximal stenosis.  He was not felt to require any further revascularization and he was not felt to have mitral valve dysfunction needing further surgical intervention.  A subsequent myocardial perfusion study was ordered by Dr. Geraldo Pitter showed an ejection fraction of 60% there is no EKG ischemia..  The patient does not have symptoms concerning for COVID-19 infection (fever, chills, cough, or new shortness of breath).   Overall he is not doing well he is very unsure of himself and he continues to have difficulty when he tries to do his walking with exertional angina and shortness of breath that forces him to stop and  rest and occurs 2 to 3 days/week he is disappointed with the response to medications and he comes today and asked to have his daughter involved in a phone conversation their concern is whether he is a candidate for further revascularization he has the atretic left thoracic arteries LAD had a moderate stenosis and has not responded well to medical treatment.  What I am going to do is have him start taking nitroglycerin before he walks 10 to 15 minutes increase the dose of ranolazine continue beta-blocker oral nitrates antiplatelet and lipid-lowering treatment.  I will ask my interventional colleagues to review his film and to see whether consideration of repeat cath PCI and/or FFR is appropriate to try to improve the quality of his life.  I think the patient is an acceptable candidate for elective cardiac procedures Past Medical History:  Diagnosis Date  . Acute on chronic diastolic heart failure ([place]) 11/19/2015  . Atrial fibrillation ([place]) 01/20/2016   postoperative   . CAD (coronary artery disease), native coronary artery 08/04/2010   Cath 2011 with severe proximal LAD stenosis treated with 2.75 x 24 mm ion stent postdilated to 3.0 mm by Dr. Burt Knack  Catheterization 2016 shows 40% ostial LAD, patent stent, 50% mid LAD, 60% circumflex, and 40% RCA stenosis with severe mitral regurgitation.   . Chronic diastolic CHF (congestive heart failure) ([place])   . Coronary artery disease    a. 2011: DES to proximal LAD  . HLD (hyperlipidemia)   . Hyperlipidemia   . Mitral regurgitation 11/19/2015  . Mitral  valve prolapse   . MVP (mitral valve prolapse) 11/19/2015  . Pleural effusion, bilateral 11/18/2015  . 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 S/P mitral valve repair 11/25/2015   Complex valvuloplasty including quadrangular resection of posterior leaflet, artificial Gore-tex neochord placement x10 and 32 mm Sorin Memo 3D Rechord ring annuloplasty  . Skin cancer    . Stroke [facility])    Past Surgical History:  Procedure Laterality Date  . CARDIAC CATHETERIZATION N/A 11/19/2015   Procedure: Left Heart Cath and Coronary Angiography;  Surgeon: Burnell Blanks, MD;  Location: [place] CV LAB;  Service: Cardiovascular;  Laterality: N/A;  . CATARACT EXTRACTION, BILATERAL  2017  . CLIPPING OF ATRIAL APPENDAGE  11/25/2015   Procedure: CLIPPING OF ATRIAL APPENDAGE;  Surgeon: Rexene Alberts, MD;  Location: [place];  Service: Open Heart Surgery;;  . CORONARY ARTERY BYPASS GRAFT N/A 11/25/2015   Procedure: CORONARY ARTERY BYPASS GRAFTING (CABG) TIMES THREE USING LEFT INTERNAL MAMMARY ARTERY AND RIGHT SAPHENOUS LEG VEIN HARVESTED ENDOSCOPICALLY;  Surgeon: Rexene Alberts, MD;  Location: [place];  Service: Open Heart Surgery;  Laterality: N/A;  . CORONARY STENT PLACEMENT  2011   DES to proximal LAD  . LEFT HEART CATH AND CORS/GRAFTS ANGIOGRAPHY N/A 05/02/2019   Procedure: LEFT HEART CATH AND CORS/GRAFTS ANGIOGRAPHY;  Surgeon: Belva Crome, MD;  Location: [place] CV LAB;  Service: Cardiovascular;  Laterality: N/A;  . LITHOTRIPSY    . MITRAL VALVE REPAIR N/A 11/25/2015   Procedure: MITRAL VALVE REPAIR (MVR);  Surgeon: Rexene Alberts, MD;  Location: [place];  Service: Open Heart Surgery;  Laterality: N/A;  . SKIN CANCER EXCISION    . TEE WITHOUT CARDIOVERSION N/A 11/22/2015   Procedure: TRANSESOPHAGEAL ECHOCARDIOGRAM (TEE);  Surgeon: [name], MD;  Location: [facility] ENDOSCOPY;  Service: Cardiovascular;  Laterality: N/A;  . TEE WITHOUT CARDIOVERSION N/A 11/25/2015   Procedure: TRANSESOPHAGEAL ECHOCARDIOGRAM (TEE);  Surgeon: Rexene Alberts, MD;  Location: [place];  Service: Open Heart Surgery;  Laterality: N/A;     Current Meds  Medication Sig  . amoxicillin (AMOXIL) 500 MG capsule Take 2,000 mg by mouth See admin instructions. One hour prior to dental work  . atorvastatin (LIPITOR) 20 MG tablet Take 20 mg by mouth daily.  . cholecalciferol (VITAMIN D) 1000  UNITS tablet Take 2,000 Units by mouth 2 (two) times daily.   . clopidogrel (PLAVIX) 75 MG tablet Take 75 mg by mouth daily.   . famotidine (PEPCID) 10 MG tablet Take 10 mg by mouth as needed for heartburn or indigestion.  . hyoscyamine (LEVSIN SL) 0.125 MG SL tablet Place 0.125 mg under the tongue every 6 (six) hours as needed for cramping.   . isosorbide mononitrate (IMDUR) 30 MG 24 hr tablet Take 1 tablet (30 mg total) by mouth daily.  . metoprolol tartrate (LOPRESSOR) 25 MG tablet Take 25 mg by mouth 2 (two) times daily.   . montelukast (SINGULAIR) 10 MG tablet Take 10 mg by mouth at bedtime.  . nitroGLYCERIN (NITROSTAT) 0.4 MG SL tablet Place 1 tablet (0.4 mg total) under the tongue every 5 (five) minutes as needed for chest pain.  . ranolazine (RANEXA) 1000 MG SR tablet Take 1 tablet (1,000 mg total) by mouth 2 (two) times daily.  . [DISCONTINUED] ranolazine (RANEXA) 500 MG 12 hr tablet Take 500 mg by mouth 2 (two) times daily.  Allergies:   Sulfa antibiotics   Social History   Tobacco Use  . Smoking status: Never Smoker  . Smokeless tobacco: Never Used  Substance Use Topics  . Alcohol use: Yes    Alcohol/week: 1.0 standard drinks    Types: 1 Glasses of wine per week    Comment: 1 glass of wine 3 - 4 times per week   . Drug use: No     Family Hx: The patient's family history includes Heart failure in his mother; Lung cancer in his brother; Stroke in his sister.  ROS:   Please see the history of present illness.    She had acute coronary syndrome All other systems reviewed and are negative.   Prior CV studies:   The following studies were reviewed today:    Labs/Other Tests and Data Reviewed:    EKG:  An ECG dated today was personally reviewed today and demonstrated:  today showed sinus rhythm first-degree AV block  Recent Labs: 01/06/2019: ALT 20 05/02/2019: B Natriuretic Peptide 142.4 05/03/2019: BUN 14; Creatinine, Ser 0.83; Hemoglobin 12.6; Platelets 176;  Potassium 4.3; Sodium 135   Recent Lipid Panel Lab Results  Component Value Date/Time   CHOL 140 01/06/2019 11:21 AM   TRIG 49 01/06/2019 11:21 AM   HDL 77 01/06/2019 11:21 AM   CHOLHDL 1.8 01/06/2019 11:21 AM   CHOLHDL 2.1 11/20/2015 12:30 AM   LDLCALC 53 01/06/2019 11:21 AM    Wt Readings from Last 3 Encounters:  06/04/19 153 lb 6.4 oz (69.6 kg)  05/15/19 155 lb (70.3 kg)  05/08/19 155 lb (70.3 kg)     Objective:    Vital Signs:  BP 110/60 (BP Location: Right Arm, Patient Position: Sitting, Cuff Size: Normal)   Pulse 61   Temp (!) 97.2 F (36.2 C)   Ht 5\' 10"  (1.778 m)   Wt 153 lb 6.4 oz (69.6 kg)   SpO2 98%   BMI 22.01 kg/m    VITAL SIGNS:  reviewed GEN:  no acute distress EYES:  sclerae anicteric, EOMI - Extraocular Movements Intact RESPIRATORY:  normal respiratory effort, symmetric expansion CARDIOVASCULAR:  no peripheral edema SKIN:  no rash, lesions or ulcers. MUSCULOSKELETAL:  no obvious deformities. NEURO:  alert and oriented x 3, no obvious focal deficit PSYCH:  normal affect He has no rales neck vein distention murmur or S3 ASSESSMENT & PLAN:    1. Coronary artery disease he continues to have exertional angina despite taking multi and antianginal medications interfering with the quality of his life uptitrate his ranolazine and start taking sublingual nitroglycerin before walking and get a second opinion from his recent coronary angiogram.  The patient was daughter will follow up with me in 3 weeks 2. Stable dyslipidemia continue statin 3. Status post mitral valve repair, reviewed his echocardiogram and he has a good durable long-term result without valve dysfunction 4. History of heart failure clinically has no evidence of decompensation at this time not on a loop diuretic  COVID-19 Education: The signs and symptoms of COVID-19 were discussed with the patient and how to seek care for testing (follow up with PCP or arrange E-visit).  The importance of social  distancing was discussed today.  Time:   Today, I have spent 35 minutes with the patient with telehealth technology discussing the above problems.     Medication Adjustments/Labs and Tests Ordered: Current medicines are reviewed at length with the patient today.  Concerns regarding medicines are outlined above.   Tests Ordered: Orders Placed  This Encounter  Procedures  . EKG 12-Lead    Medication Changes: Meds ordered this encounter  Medications  . ranolazine (RANEXA) 1000 MG SR tablet    Sig: Take 1 tablet (1,000 mg total) by mouth 2 (two) times daily.    Dispense:  180 tablet    Refill:  0    Disposition:  Follow up 3 weeks  Signed, Shirlee More, MD  06/04/2019 3:29 PM    [place]

## 2019-06-04 ENCOUNTER — Encounter: Payer: Self-pay | Admitting: Cardiology

## 2019-06-04 ENCOUNTER — Ambulatory Visit (INDEPENDENT_AMBULATORY_CARE_PROVIDER_SITE_OTHER): Payer: Medicare Other | Admitting: Cardiology

## 2019-06-04 ENCOUNTER — Other Ambulatory Visit: Payer: Self-pay

## 2019-06-04 VITALS — BP 110/60 | HR 61 | Temp 97.2°F | Ht 70.0 in | Wt 153.4 lb

## 2019-06-04 DIAGNOSIS — I25118 Atherosclerotic heart disease of native coronary artery with other forms of angina pectoris: Secondary | ICD-10-CM

## 2019-06-04 DIAGNOSIS — I5032 Chronic diastolic (congestive) heart failure: Secondary | ICD-10-CM | POA: Diagnosis not present

## 2019-06-04 DIAGNOSIS — Z9889 Other specified postprocedural states: Secondary | ICD-10-CM

## 2019-06-04 DIAGNOSIS — E782 Mixed hyperlipidemia: Secondary | ICD-10-CM

## 2019-06-04 MED ORDER — NITROGLYCERIN 0.4 MG SL SUBL: SUBLINGUAL_TABLET | SUBLINGUAL | 11 refills | Status: AC

## 2019-06-04 MED ORDER — NITROGLYCERIN 0.4 MG SL SUBL: SUBLINGUAL_TABLET | SUBLINGUAL | 11 refills | Status: DC

## 2019-06-04 MED ORDER — RANOLAZINE ER 1000 MG PO TB12: 1000.0000 mg | ORAL_TABLET | Freq: Two times a day (BID) | ORAL | 0 refills | Status: DC

## 2019-06-04 NOTE — Patient Instructions (Signed)
Medication Instructions:  Your physician has recommended you make the following change in your medication:   INCREASE ranolazine (ranexa) 1,000 mg: Take 1 tablet twice daily. (Take 2 500 mg tablets twice daily until you run out of the 500 mg tablets.)   CHANGE nitroglycerin: Take 1 tablet 10-15 minutes before walking in the morning  If you need a refill on your cardiac medications before your next appointment, please call your pharmacy.   Lab work: None  If you have labs (blood work) drawn today and your tests are completely normal, you will receive your results only by: Marland Kitchen MyChart Message (if you have MyChart) OR . A paper copy in the mail If you have any lab test that is abnormal or we need to change your treatment, we will call you to review the results.  Testing/Procedures: You had an EKG today.   Follow-Up: At [facility], you and your health needs are our priority.  As part of our continuing mission to provide you with exceptional heart care, we have created designated Provider Care Teams.  These Care Teams include your primary Cardiologist (physician) and Advanced Practice Providers (APPs -  Physician Assistants and Nurse Practitioners) who all work together to provide you with the care you need, when you need it. You will need a follow up appointment in 3 weeks.

## 2019-06-05 ENCOUNTER — Ambulatory Visit: Payer: Medicare Other | Admitting: Cardiology

## 2019-06-06 ENCOUNTER — Other Ambulatory Visit: Payer: Self-pay | Admitting: Cardiology

## 2019-06-06 MED ORDER — RANOLAZINE ER 1000 MG PO TB12: 1000.0000 mg | ORAL_TABLET | Freq: Two times a day (BID) | ORAL | 0 refills | Status: DC

## 2019-06-06 NOTE — Telephone Encounter (Signed)
[facility]azine # 12 [facility] [address] [facility] per pt request

## 2019-06-06 NOTE — Telephone Encounter (Signed)
°  Patient is at daughters house and forgot his medicine so if 12 tablets can be called in for him to get through the weekend please.   1. Which medications need to be refilled? (please list name of each medication and dose if known) Ranolazine 1000mg  SR tablet twice daily  2. Which pharmacy/location (including street and city if local pharmacy) is medication [facility] be sent to[facility] [place]  3. Do they need a 30 day or 90 day supply? 12

## 2019-06-12 ENCOUNTER — Telehealth: Payer: Self-pay | Admitting: Cardiology

## 2019-06-12 MED ORDER — RANOLAZINE ER 500 MG PO TB12: 500.0000 mg | ORAL_TABLET | Freq: Two times a day (BID) | ORAL | Status: DC

## 2019-06-12 NOTE — Telephone Encounter (Signed)
During patient's last office visit on 06/04/2019, ranexa was increased from 500 mg to 1,000 mg twice daily. Patient states that since then he has no energy or stamina. He has had some intermittent dizziness, constipation, stomach pain, and weakness. He checks his BP/HR every day and this morning his BP was 119/65 and HR 55. Patient states that he still has rare chest pain. Will have Dr. Bettina Gavia advise.

## 2019-06-12 NOTE — Telephone Encounter (Signed)
Reduce his Ranexa to 500 mg BID

## 2019-06-12 NOTE — Telephone Encounter (Signed)
Patient called requesting to speak to [place] or [place].  He has some questions about the meds he is on  Please return patient's call

## 2019-06-12 NOTE — Telephone Encounter (Signed)
Patient informed and is agreeable to reduce ranexa from 1,000 mg to 500 mg twice daily. Patient will contact our office if symptoms do not resolve. No further questions.

## 2019-06-20 ENCOUNTER — Telehealth: Payer: Medicare Other | Admitting: Cardiology

## 2019-07-02 NOTE — Progress Notes (Signed)
Cardiology Office Note:    Date:  07/03/2019   ID:  Jack James, DOB Apr 25, 1934, MRN [id number]  PCP:  Derinda Late, MD  Cardiologist:  Shirlee More, MD    Referring MD: Derinda Late, MD    ASSESSMENT:    1. Coronary artery disease of native artery of native heart with stable angina pectoris ([place])   2. Chronic diastolic CHF (congestive heart failure) ([place])   3. Mixed hyperlipidemia    PLAN:    In order of problems listed above:  1. CAD is improved, continue his current treatment including clopidogrel high intensity statin beta-blocker switching to a more hydrophilic preparation calcium channel blocker and low-dose ranolazine.  With his age and small body mass I am not can accelerate the dose.  I would not add on any additional treatment as his systolic blood pressures run from 100-110. 2. Heart failure stable compensated he has no edema and at this time does not require diuretic. 3. Stable continue his high intensity statin   Next appointment: 3 months   Medication Adjustments/Labs and Tests Ordered: Current medicines are reviewed at length with the patient today.  Concerns regarding medicines are outlined above.  No orders of the defined types were placed in this encounter.  No orders of the defined types were placed in this encounter.   No chief complaint on file.   History of Present Illness:    Jack James is a 85 y.o. male with a hx of  a hx of CAD with PCI of LAD 2011, CABG and MVR in 2016 for severe MR with MVP,hyperlipidemia and stroke 06/04/2019 last seen.  He was seen on consultation by Dr. Gabriela Eves [facility] 05/02/2019 after he was admitted to the hospital with shortness of breath and exertional chest pain.  He underwent left heart catheterization he had 70% mid LAD disease with a patent left thoracic artery although it is a atretic saphenous vein graft was patent to the distal right coronary artery with total occlusion of the right  coronary artery saphenous vein graft was patent to left circumflex and 60% proximal stenosis.  He was not felt to require any further revascularization and he was not felt to have mitral valve dysfunction needing further surgical intervention.  A subsequent myocardial perfusion study was ordered by Dr. Geraldo Pitter showed an ejection fraction of 60% there is no EKG ischemia His medical care was optimized and having ranolazine and asked Dr. Burt Knack to review his film and he told me he did not see a target for revascularization percutaneously.  Compliance with diet, lifestyle and medications: Yes  He is improved with ranolazine he could do his activities rarely has very vague not limiting brief angina has not needed nitroglycerin.  He tried nitroglycerin before walking but he is lightheaded.  He has had no side effects from ranolazine is pleased with the quality of his life.  Unfortunately he is having malaise related to his beta-blocker and will switch him to a more hydrophilic preparation to see if we can alleviate his symptoms.  I discussed with him that I do not see any opportunity for further revascularization at this time no edema orthopnea shortness of breath or syncope Past Medical History:  Diagnosis Date  . Acute on chronic diastolic heart failure ([place]) 11/19/2015  . Atrial fibrillation ([place]) 01/20/2016   postoperative   . CAD (coronary artery disease), native coronary artery 08/04/2010   Cath 2011 with severe proximal LAD stenosis treated with 2.75 x 24  mm ion stent postdilated to 3.0 mm by Dr. Burt Knack  Catheterization 2016 shows 40% ostial LAD, patent stent, 50% mid LAD, 60% circumflex, and 40% RCA stenosis with severe mitral regurgitation.   . Chronic diastolic CHF (congestive heart failure) ([place])   . Coronary artery disease    a. 2011: DES to proximal LAD  . HLD (hyperlipidemia)   . Hyperlipidemia   . Mitral regurgitation 11/19/2015  . Mitral valve prolapse   . MVP (mitral valve prolapse)  11/19/2015  . Pleural effusion, bilateral 11/18/2015  . 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 S/P mitral valve repair 11/25/2015   Complex valvuloplasty including quadrangular resection of posterior leaflet, artificial Gore-tex neochord placement x10 and 32 mm Sorin Memo 3D Rechord ring annuloplasty  . Skin cancer   . Stroke [facility])     Past Surgical History:  Procedure Laterality Date  . CARDIAC CATHETERIZATION N/A 11/19/2015   Procedure: Left Heart Cath and Coronary Angiography;  Surgeon: Burnell Blanks, MD;  Location: [place] CV LAB;  Service: Cardiovascular;  Laterality: N/A;  . CATARACT EXTRACTION, BILATERAL  2017  . CLIPPING OF ATRIAL APPENDAGE  11/25/2015   Procedure: CLIPPING OF ATRIAL APPENDAGE;  Surgeon: Rexene Alberts, MD;  Location: [place];  Service: Open Heart Surgery;;  . CORONARY ARTERY BYPASS GRAFT N/A 11/25/2015   Procedure: CORONARY ARTERY BYPASS GRAFTING (CABG) TIMES THREE USING LEFT INTERNAL MAMMARY ARTERY AND RIGHT SAPHENOUS LEG VEIN HARVESTED ENDOSCOPICALLY;  Surgeon: Rexene Alberts, MD;  Location: [place];  Service: Open Heart Surgery;  Laterality: N/A;  . CORONARY STENT PLACEMENT  2011   DES to proximal LAD  . LEFT HEART CATH AND CORS/GRAFTS ANGIOGRAPHY N/A 05/02/2019   Procedure: LEFT HEART CATH AND CORS/GRAFTS ANGIOGRAPHY;  Surgeon: Belva Crome, MD;  Location: [place] CV LAB;  Service: Cardiovascular;  Laterality: N/A;  . LITHOTRIPSY    . MITRAL VALVE REPAIR N/A 11/25/2015   Procedure: MITRAL VALVE REPAIR (MVR);  Surgeon: Rexene Alberts, MD;  Location: [place];  Service: Open Heart Surgery;  Laterality: N/A;  . SKIN CANCER EXCISION    . TEE WITHOUT CARDIOVERSION N/A 11/22/2015   Procedure: TRANSESOPHAGEAL ECHOCARDIOGRAM (TEE);  Surgeon: [name], MD;  Location[facility] ENDOSCOPY;  Service: Cardiovascular;  Laterality: N/A;  . TEE WITHOUT CARDIOVERSION N/A 11/25/2015   Procedure: TRANSESOPHAGEAL  ECHOCARDIOGRAM (TEE);  Surgeon: Rexene Alberts, MD;  Location: [place];  Service: Open Heart Surgery;  Laterality: N/A;    Current Medications: Current Meds  Medication Sig  . amoxicillin (AMOXIL) 500 MG capsule Take 2,000 mg by mouth See admin instructions. One hour prior to dental work  . atorvastatin (LIPITOR) 20 MG tablet Take 20 mg by mouth daily.  . cholecalciferol (VITAMIN D) 1000 UNITS tablet Take 2,000 Units by mouth 2 (two) times daily.   . clopidogrel (PLAVIX) 75 MG tablet Take 75 mg by mouth daily.   . famotidine (PEPCID) 10 MG tablet Take 10 mg by mouth as needed for heartburn or indigestion.  . hyoscyamine (LEVSIN SL) 0.125 MG SL tablet Place 0.125 mg under the tongue every 6 (six) hours as needed for cramping.   . isosorbide mononitrate (IMDUR) 30 MG 24 hr tablet Take 1 tablet (30 mg total) by mouth daily.  . metoprolol tartrate (LOPRESSOR) 25 MG tablet Take 25 mg by mouth 2 (two) times daily.   . montelukast (SINGULAIR) 10 MG tablet  Take 10 mg by mouth at bedtime.  . nitroGLYCERIN (NITROSTAT) 0.4 MG SL tablet Take 1 tablet 10-15 minutes before walking in the morning.  . ranolazine (RANEXA) 500 MG 12 hr tablet Take 1 tablet (500 mg total) by mouth 2 (two) times daily.     Allergies:   Sulfa antibiotics   Social History   Socioeconomic History  . Marital status: Widowed    Spouse name: Not on file  . Number of children: 2  . Years of education: Not on file  . Highest education level: Not on file  Occupational History  . Occupation: Retired  Scientific laboratory technician  . Financial resource strain: Not on file  . Food insecurity    Worry: Not on file    Inability: Not on file  . Transportation needs    Medical: Not on file    Non-medical: Not on file  Tobacco Use  . Smoking status: Never Smoker  . Smokeless tobacco: Never Used  Substance and Sexual Activity  . Alcohol use: Yes    Alcohol/week: 1.0 standard drinks    Types: 1 Glasses of wine per week    Comment: 1 glass of  wine 3 - 4 times per week   . Drug use: No  . Sexual activity: Not on file  Lifestyle  . Physical activity    Days per week: Not on file    Minutes per session: Not on file  . Stress: Not on file  Relationships  . Social Herbalist on phone: Not on file    Gets together: Not on file    Attends religious service: Not on file    Active member of club or organization: Not on file    Attends meetings of clubs or organizations: Not on file    Relationship status: Not on file  Other Topics Concern  . Not on file  Social History Narrative   Lives at home alone   Right-handed   Caffeine: 2 mugs of decaf coffee per day, green tea     Family History: The patient's family history includes Heart failure in his mother; Lung cancer in his brother; Stroke in his sister. ROS:   Please see the history of present illness.    All other systems reviewed and are negative.  EKGs/Labs/Other Studies Reviewed:    The following studies were reviewed today:  EKG:  EKG 06/04/2019 after starting ranolazine demonstrates sinus rhythm was normal including QT interval  Recent Labs: 01/06/2019: ALT 20 05/02/2019: B Natriuretic Peptide 142.4 05/03/2019: BUN 14; Creatinine, Ser 0.83; Hemoglobin 12.6; Platelets 176; Potassium 4.3; Sodium 135  Recent Lipid Panel    Component Value Date/Time   CHOL 140 01/06/2019 1121   TRIG 49 01/06/2019 1121   HDL 77 01/06/2019 1121   CHOLHDL 1.8 01/06/2019 1121   CHOLHDL 2.1 11/20/2015 0030   VLDL 7 11/20/2015 0030   LDLCALC 53 01/06/2019 1121    Physical Exam:    VS:  BP 128/70 (BP Location: Right Arm, Patient Position: Sitting, Cuff Size: Normal)   Pulse 64   Temp 97.8 F (36.6 C)   Ht 5\' 10"  (1.778 m)   Wt 156 lb 3.2 oz (70.9 kg)   SpO2 98%   BMI 22.41 kg/m     Wt Readings from Last 3 Encounters:  07/03/19 156 lb 3.2 oz (70.9 kg)  06/04/19 153 lb 6.4 oz (69.6 kg)  05/15/19 155 lb (70.3 kg)     GEN:  Well nourished, well  developed in no acute  distress HEENT: Normal NECK: No JVD; No carotid bruits LYMPHATICS: No lymphadenopathy CARDIAC: RRR, no murmurs, rubs, gallops RESPIRATORY:  Clear to auscultation without rales, wheezing or rhonchi  ABDOMEN: Soft, non-tender, non-distended MUSCULOSKELETAL:  No edema; No deformity  SKIN: Warm and dry NEUROLOGIC:  Alert and oriented x 3 PSYCHIATRIC:  Normal affect    Signed, Shirlee More, MD  07/03/2019 10:15 AM    [place]

## 2019-07-03 ENCOUNTER — Encounter: Payer: Self-pay | Admitting: Cardiology

## 2019-07-03 ENCOUNTER — Ambulatory Visit (INDEPENDENT_AMBULATORY_CARE_PROVIDER_SITE_OTHER): Payer: Medicare Other | Admitting: Cardiology

## 2019-07-03 ENCOUNTER — Other Ambulatory Visit: Payer: Self-pay

## 2019-07-03 VITALS — BP 128/70 | HR 64 | Temp 97.8°F | Ht 70.0 in | Wt 156.2 lb

## 2019-07-03 DIAGNOSIS — I5032 Chronic diastolic (congestive) heart failure: Secondary | ICD-10-CM

## 2019-07-03 DIAGNOSIS — I25118 Atherosclerotic heart disease of native coronary artery with other forms of angina pectoris: Secondary | ICD-10-CM

## 2019-07-03 DIAGNOSIS — E782 Mixed hyperlipidemia: Secondary | ICD-10-CM | POA: Diagnosis not present

## 2019-07-03 MED ORDER — ACEBUTOLOL HCL 200 MG PO CAPS: 200.0000 mg | ORAL_CAPSULE | Freq: Every day | ORAL | 3 refills | Status: DC

## 2019-07-03 NOTE — Patient Instructions (Signed)
Medication Instructions:  Your physician has recommended you make the following change in your medication:   STOP metoprolol   START acebutolol (sectral) 200 mg: Take 1 capsule daily  If you need a refill on your cardiac medications before your next appointment, please call your pharmacy.   Lab work: None  If you have labs (blood work) drawn today and your tests are completely normal, you will receive your results only by: Marland Kitchen MyChart Message (if you have MyChart) OR . A paper copy in the mail If you have any lab test that is abnormal or we need to change your treatment, we will call you to review the results.  Testing/Procedures: None  Follow-Up: At [facility], you and your health needs are our priority.  As part of our continuing mission to provide you with exceptional heart care, we have created designated Provider Care Teams.  These Care Teams include your primary Cardiologist (physician) and Advanced Practice Providers (APPs -  Physician Assistants and Nurse Practitioners) who all work together to provide you with the care you need, when you need it. You will need a follow up appointment in 3 months.      Acebutolol capsules What is this medicine? ACEBUTOLOL (a se BYOO toe lole) is a beta-blocker. Beta-blockers reduce the workload on the heart and help it to beat more regularly. This medicine is used to treat high blood pressure and to treat or prevent certain heart rhythm problems. This medicine may be used for other purposes; ask your health care provider or pharmacist if you have questions. COMMON BRAND NAME(S): Sectral What should I tell my health care provider before I take this medicine? They need to know if you have any of these conditions:  diabetes  heart or vessel disease like slow heartrate, worsening heart failure, heart block, sick sinus syndrome or Raynaud's disease  kidney disease  liver disease  lung or breathing disease, like asthma or  emphysema  pheochromocytoma  thyroid disease  an unusual or allergic reaction to acebutolol, other beta-blockers, medicines, foods, dyes, or preservatives  pregnant or trying to get pregnant  breast-feeding How should I use this medicine? Take this medicine by mouth with a glass of water. Follow the directions on the prescription label. You can take this medicine with or without food. Take your doses at regular intervals. Do not take your medicine more often than directed. Do not stop taking this medicine suddenly. This could lead to serious heart-related effects. Talk to your pediatrician regarding the use of this medicine in children. Special care may be needed. Overdosage: If you think you have taken too much of this medicine contact a poison control center or emergency room at once. NOTE: This medicine is only for you. Do not share this medicine with others. What if I miss a dose? If you miss a dose, take it as soon as you can. If it is almost time for your next dose, take only that dose. Do not take double or extra doses. What may interact with this medicine? This medicine may interact with the following medications:  certain medicines for blood pressure, heart disease, irregular heart beat  NSAIDS, medicines for pain and inflammation, like ibuprofen or naproxen This list may not describe all possible interactions. Give your health care provider a list of all the medicines, herbs, non-prescription drugs, or dietary supplements you use. Also tell them if you smoke, drink alcohol, or use illegal drugs. Some items may interact with your medicine. What should I  watch for while using this medicine? Visit your doctor or health care professional for regular checks on your progress. Check your heart rate and blood pressure regularly while you are taking this medicine. Ask your doctor or health care professional what your heart rate and blood pressure should be, and when you should contact him  or her. You may get drowsy or dizzy. Do not drive, use machinery, or do anything that needs mental alertness until you know how this drug affects you. Do not stand or sit up quickly, especially if you are an older patient. This reduces the risk of dizzy or fainting spells. Alcohol can make you more drowsy and dizzy. Avoid alcoholic drinks. This medicine may increase blood sugar. Ask your healthcare provider if changes in diet or medicines are needed if you have diabetes. Do not treat yourself for coughs, colds, or pain while you are taking this medicine without asking your doctor or health care professional for advice. Some ingredients may increase your blood pressure. What side effects may I notice from receiving this medicine? Side effects that you should report to your doctor or health care professional as soon as possible:  allergic reactions like skin rash, itching or hives, swelling of the face, lips, or tongue  breathing problems  chest pain  cold, tingling, or numb hands or feet  confusion  irregular heartbeat  muscle aches and pains   signs and symptoms of high blood sugar such as being more thirsty or hungry or having to urinate more than normal. You may also feel very tired or have blurry vision.  slow heart rate  sweating  swollen legs or ankles  tremor, shakes  vomiting Side effects that usually do not require medical attention (report to your doctor or health care professional if they continue or are bothersome):  anxiety  change in sex drive or performance  depression  diarrhea  dry or burning eyes  headache  nausea This list may not describe all possible side effects. Call your doctor for medical advice about side effects. You may report side effects to FDA at 1-800-FDA-1088. Where should I keep my medicine? Keep out of the reach of children. Store at room temperature between 15 and 30 degrees C (59 and 86 degrees F). Protect from light. Keep  container tightly closed. Throw away any unused medicine after the expiration date. NOTE: This sheet is a summary. It may not cover all possible information. If you have questions about this medicine, talk to your doctor, pharmacist, or health care provider.  2020 Elsevier/Gold Standard (2018-10-01 10:15:58)

## 2019-07-21 ENCOUNTER — Telehealth: Payer: Self-pay | Admitting: Cardiology

## 2019-07-21 NOTE — Telephone Encounter (Signed)
Patient called stating he had an episode of chest pains yesterday he wants to discuss  Please call patient to discuss [phone]

## 2019-07-22 NOTE — Telephone Encounter (Signed)
Patient states he had an episode of chest pain on Sunday morning when he woke up. He took a nitroglycerin tablet at 8:55 am and another at 9:15 am. This did not fully resolve his chest pain, so he decided to take a nap. When he woke up, he still had chest pain so he took another nitroglycerin tablet at 12:30 pm and his chest pain resolved. He continued to have some chest tightness throughout the afternoon and evening but patient states it was not alarming. Patient denies any active chest pain or tightness at this time. He is taking all of his medications as prescribed. He wanted to make our office aware of this episode. Please advise of any further recommendations.

## 2019-07-22 NOTE — Telephone Encounter (Signed)
No changes in meds

## 2019-07-22 NOTE — Telephone Encounter (Signed)
Patient informed to continue current medications as prescribed and contact our office with any further or worsening symptoms. Patient is agreeable and verbalized understanding. No further questions.

## 2019-07-30 ENCOUNTER — Other Ambulatory Visit: Payer: Self-pay | Admitting: Cardiology

## 2019-08-04 ENCOUNTER — Other Ambulatory Visit: Payer: Self-pay | Admitting: Cardiology

## 2019-08-04 MED ORDER — ACEBUTOLOL HCL 200 MG PO CAPS: 200.0000 mg | ORAL_CAPSULE | Freq: Every day | ORAL | 1 refills | Status: DC

## 2019-08-04 MED ORDER — RANOLAZINE ER 500 MG PO TB12: 500.0000 mg | ORAL_TABLET | Freq: Two times a day (BID) | ORAL | 1 refills | Status: DC

## 2019-08-04 NOTE — Telephone Encounter (Signed)
°*  STAT* If patient is at the pharmacy, call can be transferred to refill team.   1. Which medications need to be refilled? (please list name of each medication and dose if known) Acebutolol cap and ranolazine er tab  2. Which pharmacy/location (including street and city if local pharmacy) is medication to be sent to? [facility]  3. Do they need a 30 day or 90 day supply? [place]

## 2019-08-04 NOTE — Telephone Encounter (Signed)
Rx for acebutolol and ranolazine #90 sent to [facility] as requested.

## 2019-08-27 ENCOUNTER — Other Ambulatory Visit: Payer: Self-pay

## 2019-08-27 ENCOUNTER — Telehealth: Payer: Self-pay | Admitting: *Deleted

## 2019-08-27 ENCOUNTER — Inpatient Hospital Stay: Payer: Medicare Other | Attending: Hematology | Admitting: Hematology

## 2019-08-27 VITALS — BP 117/62 | HR 70 | Temp 98.5°F | Resp 18 | Ht 70.0 in | Wt 154.9 lb

## 2019-08-27 DIAGNOSIS — Z79899 Other long term (current) drug therapy: Secondary | ICD-10-CM | POA: Diagnosis not present

## 2019-08-27 DIAGNOSIS — Z85828 Personal history of other malignant neoplasm of skin: Secondary | ICD-10-CM | POA: Diagnosis not present

## 2019-08-27 DIAGNOSIS — C8589 Other specified types of non-Hodgkin lymphoma, extranodal and solid organ sites: Secondary | ICD-10-CM | POA: Diagnosis not present

## 2019-08-27 DIAGNOSIS — C8581 Other specified types of non-Hodgkin lymphoma, lymph nodes of head, face, and neck: Secondary | ICD-10-CM | POA: Diagnosis present

## 2019-08-27 DIAGNOSIS — R079 Chest pain, unspecified: Secondary | ICD-10-CM | POA: Insufficient documentation

## 2019-08-27 DIAGNOSIS — I5032 Chronic diastolic (congestive) heart failure: Secondary | ICD-10-CM | POA: Diagnosis not present

## 2019-08-27 DIAGNOSIS — I2510 Atherosclerotic heart disease of native coronary artery without angina pectoris: Secondary | ICD-10-CM | POA: Diagnosis not present

## 2019-08-27 DIAGNOSIS — I341 Nonrheumatic mitral (valve) prolapse: Secondary | ICD-10-CM | POA: Insufficient documentation

## 2019-08-27 DIAGNOSIS — E785 Hyperlipidemia, unspecified: Secondary | ICD-10-CM | POA: Insufficient documentation

## 2019-08-27 DIAGNOSIS — Z8673 Personal history of transient ischemic attack (TIA), and cerebral infarction without residual deficits: Secondary | ICD-10-CM | POA: Insufficient documentation

## 2019-08-27 DIAGNOSIS — I4891 Unspecified atrial fibrillation: Secondary | ICD-10-CM | POA: Diagnosis not present

## 2019-08-27 DIAGNOSIS — C8599 Non-Hodgkin lymphoma, unspecified, extranodal and solid organ sites: Secondary | ICD-10-CM

## 2019-08-27 NOTE — Progress Notes (Signed)
HEMATOLOGY/ONCOLOGY CONSULTATION NOTE  Date of Service: 08/27/2019  Patient Care Team: Derinda Late, MD as PCP - General (Family Medicine) Bettina Gavia Hilton Cork, MD as PCP - Cardiology (Cardiology) Jacolyn Reedy, MD as Consulting Physician (Cardiology)  CHIEF COMPLAINTS/PURPOSE OF CONSULTATION:  Intraocular CD10+  Lymphoma  HISTORY OF PRESENTING ILLNESS:   Jack James is a wonderful 85 y.o. male who has been referred to [place] by Dr Mariana Single for evaluation and management of newly diagnosed rt vitreo-retinal Intraocular CD10+ Lymphoma. Pt is accompanied today by his daughter Claiborne Billings, by phone. The pt reports that he is doing well overall.  The pt reports that he doesn't feel significantly different than he did 6 months ago. His eye issues began in July 2020 and and he went to see Dr. Renae Fickle. He noticed blurry vision in his right eye, which has gotten progressively worse since his cataract surgery. After the procedure Dr. Renae Fickle plugged the tear ducts in his right eye and has been treating it as dry eye. Pt was put on Prednisone eye drops for 10 days prior to his surgery. There was no indication from her that his ocular irritation is from a viral infection. As per patient here was no concern from Dr. Posey Pronto that there was a tumor in his left eye.  Pt has been taking Plavix for his chest pains, which was not stopped for his cataract surgery. Pt has not had any additional imaging studies done to his knowledge. Pt does have a cardiac stent and reports no other metal in his body. He notes no chemical exposure and no known blood transfusions.  Pt is currently active, walking about a mile every morning. He is limited by arthritis but not SOB or chest pain.   Of note prior to the patient's visit today, pt has had Flow Cytometry completed on 08/12/2019 with results revealing "CD10+ clonal B cell population detected."   Most recent lab results (05/14/2019) of CBC w/diff & CMP is as follows: WBC at  5.0K, RBC at 4.28, Hgb at 13.5, HCT at 41.3, MCV at 96.5, MCH at 31.5, MCHC at 32.7, Platelets at 222K, Neutrophil Rel at 60.7, Neutro Abs at 2.0K, Lymphocyte Rel at 25.8, Lymphocyte Abs at 1.28, Monocyte Rel at 11.9, Monocyte Abs at 0.59, Glucose at 89, Calcium at 9.4, Potassium at 4.2, BUN at 19, Creatinine at 1.03, Albumin at 4.4, Total Protein at 6.6, Alkaline Phosphatase at 66.  05/14/2019 Uric Acid at 3.0 05/14/2019 TSH at 0.720  On review of systems, pt reports blurry vision and denies headaches, issues speaking/ swallowing, loss of motor control, new lumps/bumps, fevers, chills, night sweats, chest pains, spine pain, abdominal pain, trouble urinating, testicular pain/swelling and any other symptoms.   On PMHx the pt reports Non-melanoma skin cancer, CAD, CHF, s/p mitrial valve repair, s/p CABG x 3.   MEDICAL HISTORY:  Past Medical History:  Diagnosis Date  . Acute on chronic diastolic heart failure ([place]) 11/19/2015  . Atrial fibrillation ([place]) 01/20/2016   postoperative   . CAD (coronary artery disease), native coronary artery 08/04/2010   Cath 2011 with severe proximal LAD stenosis treated with 2.75 x 24 mm ion stent postdilated to 3.0 mm by Dr. Burt Knack  Catheterization 2016 shows 40% ostial LAD, patent stent, 50% mid LAD, 60% circumflex, and 40% RCA stenosis with severe mitral regurgitation.   . Chronic diastolic CHF (congestive heart failure) ([place])   . Coronary artery disease    a. 2011: DES to proximal LAD  .  HLD (hyperlipidemia)   . Hyperlipidemia   . Mitral regurgitation 11/19/2015  . Mitral valve prolapse   . MVP (mitral valve prolapse) 11/19/2015  . Pleural effusion, bilateral 11/18/2015  . 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 S/P mitral valve repair 11/25/2015   Complex valvuloplasty including quadrangular resection of posterior leaflet, artificial Gore-tex neochord placement x10 and 32 mm Sorin Memo 3D Rechord ring  annuloplasty  . Skin cancer   . Stroke [facility])     SURGICAL HISTORY: Past Surgical History:  Procedure Laterality Date  . CARDIAC CATHETERIZATION N/A 11/19/2015   Procedure: Left Heart Cath and Coronary Angiography;  Surgeon: Burnell Blanks, MD;  Location: [place] CV LAB;  Service: Cardiovascular;  Laterality: N/A;  . CATARACT EXTRACTION, BILATERAL  2017  . CLIPPING OF ATRIAL APPENDAGE  11/25/2015   Procedure: CLIPPING OF ATRIAL APPENDAGE;  Surgeon: Rexene Alberts, MD;  Location: [place];  Service: Open Heart Surgery;;  . CORONARY ARTERY BYPASS GRAFT N/A 11/25/2015   Procedure: CORONARY ARTERY BYPASS GRAFTING (CABG) TIMES THREE USING LEFT INTERNAL MAMMARY ARTERY AND RIGHT SAPHENOUS LEG VEIN HARVESTED ENDOSCOPICALLY;  Surgeon: Rexene Alberts, MD;  Location: [place];  Service: Open Heart Surgery;  Laterality: N/A;  . CORONARY STENT PLACEMENT  2011   DES to proximal LAD  . LEFT HEART CATH AND CORS/GRAFTS ANGIOGRAPHY N/A 05/02/2019   Procedure: LEFT HEART CATH AND CORS/GRAFTS ANGIOGRAPHY;  Surgeon: Belva Crome, MD;  Location: [place] CV LAB;  Service: Cardiovascular;  Laterality: N/A;  . LITHOTRIPSY    . MITRAL VALVE REPAIR N/A 11/25/2015   Procedure: MITRAL VALVE REPAIR (MVR);  Surgeon: Rexene Alberts, MD;  Location: [place];  Service: Open Heart Surgery;  Laterality: N/A;  . SKIN CANCER EXCISION    . TEE WITHOUT CARDIOVERSION N/A 11/22/2015   Procedure: TRANSESOPHAGEAL ECHOCARDIOGRAM (TEE);  Surgeon: [name], MD;  Location: [facility] ENDOSCOPY;  Service: Cardiovascular;  Laterality: N/A;  . TEE WITHOUT CARDIOVERSION N/A 11/25/2015   Procedure: TRANSESOPHAGEAL ECHOCARDIOGRAM (TEE);  Surgeon: Rexene Alberts, MD;  Location: [place];  Service: Open Heart Surgery;  Laterality: N/A;    SOCIAL HISTORY: Social History   Socioeconomic History  . Marital status: Widowed    Spouse name: Not on file  . Number of children: 2  . Years of education: Not on file  . Highest education level:  Not on file  Occupational History  . Occupation: Retired  Scientific laboratory technician  . Financial resource strain: Not on file  . Food insecurity    Worry: Not on file    Inability: Not on file  . Transportation needs    Medical: Not on file    Non-medical: Not on file  Tobacco Use  . Smoking status: Never Smoker  . Smokeless tobacco: Never Used  Substance and Sexual Activity  . Alcohol use: Yes    Alcohol/week: 1.0 standard drinks    Types: 1 Glasses of wine per week    Comment: 1 glass of wine 3 - 4 times per week   . Drug use: No  . Sexual activity: Not on file  Lifestyle  . Physical activity    Days per week: Not on file    Minutes per session: Not on file  . Stress: Not on file  Relationships  . Social Herbalist on phone: Not on file    Gets together:  Not on file    Attends religious service: Not on file    Active member of club or organization: Not on file    Attends meetings of clubs or organizations: Not on file    Relationship status: Not on file  . Intimate partner violence    Fear of current or ex partner: Not on file    Emotionally abused: Not on file    Physically abused: Not on file    Forced sexual activity: Not on file  Other Topics Concern  . Not on file  Social History Narrative   Lives at home alone   Right-handed   Caffeine: 2 mugs of decaf coffee per day, green tea    FAMILY HISTORY: Family History  Problem Relation Age of Onset  . Heart failure Mother   . Stroke Sister   . Lung cancer Brother     ALLERGIES:  is allergic to sulfa antibiotics.  MEDICATIONS:  Current Outpatient Medications  Medication Sig Dispense Refill  . acebutolol (SECTRAL) 200 MG capsule Take 1 capsule (200 mg total) by mouth daily. 90 capsule 1  . amoxicillin (AMOXIL) 500 MG capsule Take 2,000 mg by mouth See admin instructions. One hour prior to dental work  4  . atorvastatin (LIPITOR) 20 MG tablet Take 20 mg by mouth daily.    . cholecalciferol (VITAMIN D) 1000  UNITS tablet Take 2,000 Units by mouth 2 (two) times daily.     . clopidogrel (PLAVIX) 75 MG tablet Take 75 mg by mouth daily.     . famotidine (PEPCID) 10 MG tablet Take 10 mg by mouth as needed for heartburn or indigestion.    . hyoscyamine (LEVSIN SL) 0.125 MG SL tablet Place 0.125 mg under the tongue every 6 (six) hours as needed for cramping.     . isosorbide mononitrate (IMDUR) 30 MG 24 hr tablet Take 1 tablet (30 mg total) by mouth daily. 90 tablet 2  . montelukast (SINGULAIR) 10 MG tablet Take 10 mg by mouth at bedtime.    . nitroGLYCERIN (NITROSTAT) 0.4 MG SL tablet Take 1 tablet 10-15 minutes before walking in the morning. 30 tablet 11  . ranolazine (RANEXA) 500 MG 12 hr tablet Take 1 tablet (500 mg total) by mouth 2 (two) times daily. 180 tablet 1   No current facility-administered medications for this visit.     REVIEW OF SYSTEMS:    10 Point review of Systems was done is negative except as noted above.  PHYSICAL EXAMINATION: ECOG PERFORMANCE STATUS: 1 - Symptomatic but completely ambulatory  . Vitals:   08/27/19 1539  BP: 117/62  Pulse: 70  Resp: 18  Temp: 98.5 F (36.9 C)  SpO2: 99%   Filed Weights   08/27/19 1539  Weight: 154 lb 14.4 oz (70.3 kg)   .Body mass index is 22.23 kg/m.  GENERAL:alert, in no acute distress and comfortable SKIN: no acute rashes, no significant lesions EYES: decreased vision in rt eye with dilated pupil, no pallor no conjunctival congestion. OROPHARYNX: MMM, no exudates, no oropharyngeal erythema or ulceration NECK: supple, no JVD LYMPH:  no palpable lymphadenopathy in the cervical, axillary or inguinal regions LUNGS: clear to auscultation b/l with normal respiratory effort HEART: regular rate & rhythm ABDOMEN:  normoactive bowel sounds , non tender, not distended. Extremity: no pedal edema PSYCH: alert & oriented x 3 with fluent speech NEURO: no focal motor/sensory deficits  LABORATORY DATA:  I have reviewed the data as  listed  . CBC Latest Ref  Rng & Units 05/03/2019 05/02/2019 11/30/2015  WBC 4.0 - 10.5 K/uL 4.9 5.1 10.5  Hemoglobin 13.0 - 17.0 g/dL 12.6(L) 13.7 9.0(L)  Hematocrit 39.0 - 52.0 % 39.0 42.6 28.1(L)  Platelets 150 - 400 K/uL 176 184 190    . CMP Latest Ref Rng & Units 05/03/2019 05/02/2019 01/06/2019  Glucose 70 - 99 mg/dL 94 92 88  BUN 8 - 23 mg/dL 14 15 15   Creatinine 0.61 - 1.24 mg/dL 0.83 0.89 0.98  Sodium 135 - 145 mmol/L 135 136 138  Potassium 3.5 - 5.1 mmol/L 4.3 3.7 4.3  Chloride 98 - 111 mmol/L 101 100 97  CO2 22 - 32 mmol/L 27 25 27   Calcium 8.9 - 10.3 mg/dL 8.8(L) 9.4 9.7  Total Protein 6.0 - 8.5 g/dL - - 6.6  Total Bilirubin 0.0 - 1.2 mg/dL - - 0.7  Alkaline Phos 39 - 117 IU/L - - 62  AST 0 - 40 IU/L - - 30  ALT 0 - 44 IU/L - - 20     RADIOGRAPHIC STUDIES: I have personally reviewed the radiological images as listed and agreed with the findings in the report. No results found.  ASSESSMENT & PLAN:   85 yo with  1) Newly diagnosed right vitreoretinal CD10 positive lymphoma. The sample of the vitreous sent had poor cellular viability of 9% likely related to use of steroid eyedrops for about 10 days prior to the vitrectomy. Unable to determine if these lymphocytes were small and consistent with follicular lymphoma versus a more large cell aggressive B-cell lymphoma such as diffuse large B-cell lymphoma or Burkitt's. With his presentation a diffuse large B-cell lymphoma is the most likely concern.  Uncertain at this time if this is a primary intraocular lymphoma were versus an orbital lymphoma versus a primary CNS lymphoma with eye involvement versus systemic lymphoma with secondary eye involvement. Plan -Discussed patient's most recent labs from 05/14/2019, WBC at 5.0K, RBC at 4.28, Hgb at 13.5, HCT at 41.3, MCV at 96.5, MCH at 31.5, MCHC at 32.7, Platelets at 222K, Neutrophil Rel at 60.7, Neutro Abs at 2.0K, Lymphocyte Rel at 25.8, Lymphocyte Abs at 1.28, Monocyte Rel at 11.9,  Monocyte Abs at 0.59, Glucose at 89, Calcium at 9.4, Potassium at 4.2, BUN at 19, Creatinine at 1.03, Albumin at 4.4, Total Protein at 6.6, Alkaline Phosphatase at 66.  -Discussed 05/14/2019 TSH at 0.720 -Discussed 08/12/2019 Flow Cytometry on his vitrectomy sample with revealed "CD10+ clonal B cell population detected."  -Pt has confirmed CD10+ B-Cell Lymphoma -Need to determine the subtype of lymphoma, and if this is a primary intraocular lymphoma  versus an orbital lymphoma versus a primary CNS lymphoma with eye involvement versus systemic lymphoma with secondary eye involvement. -Plan to biopsy any additional tumors found. -Discussed localized treatment vs systemic therapies based on further diagnosis.  -Discouraged unecessary steroid use until testing is completed -Would like a lumbar puncture but differ to Cardiologist about holding Plavix for the test.  I have messaged Dr. Bettina Gavia and Dr. Wynonia Lawman his cardiology team -Will need stat brain MRI & orbital MRI with and without contrast to evaluate his intraocular versus primary CNS lymphoma urgently due to progressive vision loss and possible brain involvement. -Will need to get an urgent whole body PET/CT scan to rule out systemic lymphoma -Lymphoma specific additional labs ordered today  -Will get labs with PET/CT scan as per patient's wishes -Patient's case was discussed by me with his retina specialist Dr. Posey Pronto.  FOLLOW UP: MRI brain and  MRI orbits ASAP in 1-3 days PET/CT in 3-5 days Labs with MRI or PET/CT (since patient lives out of town)  All of the patients questions were answered with apparent satisfaction. The patient knows to call the clinic with any problems, questions or concerns.  I spent 68mins counseling the patient face to face. The total time spent in the appointment was 50min and more than 50% was on counseling and direct patient cares.    Sullivan Lone MD [place] AAHIVMS [facility] Hematology/Oncology Physician [facility]  (Office):       [phone] (Work cell):  [phone] (Fax):           [phone]  08/27/2019 4:56 PM  I, Yevette Edwards, am acting as a scribe for Dr. Sullivan Lone.   .I have reviewed the above documentation for accuracy and completeness, and I agree with the above. Brunetta Genera MD

## 2019-08-27 NOTE — Telephone Encounter (Signed)
Patient referred by Dr. Jalene Mullet, ophthalmologist w/[facility].    Dr. Irene Limbo requested patient/family be contacted and offered an appointment 9/2 at 3p.  Attempted to contact patient at home/cell numbers - LVM on cell to contact office.  Attempted to contact daughter Ms. Perrone (emergency contact) on work/cell/home numbers - LVM on home number to contact office.

## 2019-08-28 ENCOUNTER — Telehealth: Payer: Self-pay | Admitting: Hematology

## 2019-08-28 NOTE — Telephone Encounter (Signed)
Per 9/2 los MRI brain and MRI orbits ASAP   Labs with MRI or PET/CT .Marland Kitchen  Scheduled lab per 9/2 los.  Spoke with patient and he is aware of the appt date and time

## 2019-09-02 ENCOUNTER — Other Ambulatory Visit: Payer: Self-pay | Admitting: Hematology

## 2019-09-02 DIAGNOSIS — C8589 Other specified types of non-Hodgkin lymphoma, extranodal and solid organ sites: Secondary | ICD-10-CM

## 2019-09-02 DIAGNOSIS — C8599 Non-Hodgkin lymphoma, unspecified, extranodal and solid organ sites: Secondary | ICD-10-CM

## 2019-09-03 ENCOUNTER — Telehealth: Payer: Self-pay | Admitting: *Deleted

## 2019-09-03 NOTE — Telephone Encounter (Signed)
Daughter, Mardella Layman, left voice mail 9/8 to verify upcoming scan appointments and to find out if Dr. Irene Limbo had spoken with cardiologists and ordered Lumbar Puncture for patient. LP was ordered. Dr. Irene Limbo gave verbal order that patient hold Plavix for 5 days prior to test. [facility] Radiology Scheduling, spoke with Vivien Rota, requested "Hold Plavix for 5 days" be added to appointment notes so that appropriate amount of time allowed when scheduling test.  Contacted patient/daughter on 9/9 to verify that MD had ordered LP. Gave number for [facility] so that family can assist in scheduling and know that patient is to hold Plavix for 5 days prior to test. Answered daughter's questions related to tests. Encouraged her to speak with someone in [place] for specifics regarding LP. They verbalized understanding.

## 2019-09-05 ENCOUNTER — Inpatient Hospital Stay: Payer: Medicare Other

## 2019-09-05 ENCOUNTER — Other Ambulatory Visit: Payer: Self-pay

## 2019-09-05 ENCOUNTER — Encounter (HOSPITAL_COMMUNITY)
Admission: RE | Admit: 2019-09-05 | Discharge: 2019-09-05 | Disposition: A | Discharge status: Home / Self Care | Payer: Medicare Other | Source: Ambulatory Visit | Attending: Hematology | Admitting: Hematology

## 2019-09-05 DIAGNOSIS — C8599 Non-Hodgkin lymphoma, unspecified, extranodal and solid organ sites: Secondary | ICD-10-CM | POA: Insufficient documentation

## 2019-09-05 DIAGNOSIS — Z79899 Other long term (current) drug therapy: Secondary | ICD-10-CM | POA: Diagnosis not present

## 2019-09-05 DIAGNOSIS — C8589 Other specified types of non-Hodgkin lymphoma, extranodal and solid organ sites: Secondary | ICD-10-CM | POA: Diagnosis present

## 2019-09-05 DIAGNOSIS — C8339 Primary central nervous system lymphoma: Secondary | ICD-10-CM

## 2019-09-05 LAB — CMP (CANCER CENTER ONLY)
ALT: 13 U/L (ref 0–44)
AST: 23 U/L (ref 15–41)
Albumin: 4.3 g/dL (ref 3.5–5.0)
Alkaline Phosphatase: 57 U/L (ref 38–126)
Anion gap: 7 (ref 5–15)
BUN: 20 mg/dL (ref 8–23)
CO2: 27 mmol/L (ref 22–32)
Calcium: 9.1 mg/dL (ref 8.9–10.3)
Chloride: 101 mmol/L (ref 98–111)
Creatinine: 0.97 mg/dL (ref 0.61–1.24)
GFR, Est AFR Am: >60 mL/min (ref >60)
GFR, Estimated: >60 mL/min (ref >60)
Glucose, Bld: 90 mg/dL (ref 70–99)
Potassium: 4 mmol/L (ref 3.5–5.1)
Sodium: 135 mmol/L (ref 135–145)
Total Bilirubin: 1.3 mg/dL — ABNORMAL HIGH (ref 0.3–1.2)
Total Protein: 6.8 g/dL (ref 6.5–8.1)

## 2019-09-05 LAB — CBC WITH DIFFERENTIAL/PLATELET
Abs Immature Granulocytes: 0 10*3/uL (ref 0.00–0.07)
Basophils Absolute: 0 10*3/uL (ref 0.0–0.1)
Basophils Relative: 0 %
Eosinophils Absolute: 0.1 10*3/uL (ref 0.0–0.5)
Eosinophils Relative: 1 %
HCT: 39.4 % (ref 39.0–52.0)
Hemoglobin: 12.9 g/dL — ABNORMAL LOW (ref 13.0–17.0)
Immature Granulocytes: 0 %
Lymphocytes Relative: 25 %
Lymphs Abs: 1.3 10*3/uL (ref 0.7–4.0)
MCH: 32.6 pg (ref 26.0–34.0)
MCHC: 32.7 g/dL (ref 30.0–36.0)
MCV: 99.5 fL (ref 80.0–100.0)
Monocytes Absolute: 0.6 10*3/uL (ref 0.1–1.0)
Monocytes Relative: 11 %
Neutro Abs: 3.2 10*3/uL (ref 1.7–7.7)
Neutrophils Relative %: 63 %
Platelets: 204 10*3/uL (ref 150–400)
RBC: 3.96 MIL/uL — ABNORMAL LOW (ref 4.22–5.81)
RDW: 12.6 % (ref 11.5–15.5)
WBC: 5.1 10*3/uL (ref 4.0–10.5)
nRBC: 0 % (ref 0.0–0.2)

## 2019-09-05 LAB — GLUCOSE, CAPILLARY: Glucose-Capillary: 91 mg/dL (ref 70–99)

## 2019-09-05 LAB — LACTATE DEHYDROGENASE: LDH: 182 U/L (ref 98–192)

## 2019-09-05 MED ORDER — FLUDEOXYGLUCOSE F - 18 (FDG) INJECTION
7.7000 | Freq: Once | INTRAVENOUS | Status: AC
  Administered 2019-09-05: 7.7 via INTRAVENOUS

## 2019-09-06 LAB — HEPATITIS B CORE ANTIBODY, TOTAL: Hep B Core Total Ab: NEGATIVE

## 2019-09-06 LAB — HEPATITIS C ANTIBODY: HCV Ab: <0.1 s/co ratio (ref 0.0–0.9)

## 2019-09-06 LAB — HIV ANTIBODY (ROUTINE TESTING W REFLEX): HIV Screen 4th Generation wRfx: NONREACTIVE

## 2019-09-06 LAB — HEPATITIS B SURFACE ANTIGEN: Hepatitis B Surface Ag: NEGATIVE

## 2019-09-08 ENCOUNTER — Ambulatory Visit (HOSPITAL_COMMUNITY)
Admission: RE | Admit: 2019-09-08 | Discharge: 2019-09-08 | Disposition: A | Discharge status: Home / Self Care | Payer: Medicare Other | Source: Ambulatory Visit | Attending: Hematology | Admitting: Hematology

## 2019-09-08 ENCOUNTER — Other Ambulatory Visit: Payer: Self-pay

## 2019-09-08 DIAGNOSIS — C8589 Other specified types of non-Hodgkin lymphoma, extranodal and solid organ sites: Secondary | ICD-10-CM | POA: Diagnosis present

## 2019-09-08 DIAGNOSIS — C8599 Non-Hodgkin lymphoma, unspecified, extranodal and solid organ sites: Secondary | ICD-10-CM | POA: Insufficient documentation

## 2019-09-08 MED ORDER — GADOBUTROL 1 MMOL/ML IV SOLN
7.0000 mL | Freq: Once | INTRAVENOUS | Status: AC | PRN
  Administered 2019-09-08: 7 mL via INTRAVENOUS

## 2019-09-09 ENCOUNTER — Telehealth: Payer: Self-pay | Admitting: *Deleted

## 2019-09-09 NOTE — Telephone Encounter (Signed)
Daughter Ms. Perrone called [phone] - Multiple questions:  requested info on recent PET/MRI scans and scheduling Lumbar Puncture. Patient has colonoscopy (routine) scheduled for 9/22 - asking if it should be rescheduled while diagnostic tests are being completed. Would like to know when Dr. Irene Limbo will review test results with them. Per Dr. Irene Limbo, inform daughter of the following: Recent scan results are not concerning, he will review in detail once LP completed. Will schedule appt 2-3 days following LP to review all results.  Dr. Irene Limbo recommends r/s colonoscopy until later in the year.  Daughter informed as requested by MD. Hanley Seamen daughter number for [facility] Scheduling to call r/t LP. Daughter expressed appreciation for call and verbalized understanding of all information.

## 2019-09-11 ENCOUNTER — Other Ambulatory Visit (HOSPITAL_COMMUNITY): Payer: Self-pay | Admitting: Diagnostic Radiology

## 2019-09-12 ENCOUNTER — Telehealth: Payer: Self-pay | Admitting: Hematology

## 2019-09-12 NOTE — Telephone Encounter (Signed)
Scheduled appt per 9/17 [facility] message - pt daughter aware of appt date and time

## 2019-09-17 ENCOUNTER — Other Ambulatory Visit: Payer: Self-pay

## 2019-09-17 ENCOUNTER — Ambulatory Visit (HOSPITAL_COMMUNITY)
Admission: RE | Admit: 2019-09-17 | Discharge: 2019-09-17 | Disposition: A | Discharge status: Home / Self Care | Payer: Medicare Other | Source: Ambulatory Visit | Attending: Hematology | Admitting: Hematology

## 2019-09-17 DIAGNOSIS — C8589 Other specified types of non-Hodgkin lymphoma, extranodal and solid organ sites: Secondary | ICD-10-CM | POA: Diagnosis present

## 2019-09-17 DIAGNOSIS — C8599 Non-Hodgkin lymphoma, unspecified, extranodal and solid organ sites: Secondary | ICD-10-CM | POA: Diagnosis present

## 2019-09-17 DIAGNOSIS — C8339 Primary central nervous system lymphoma: Secondary | ICD-10-CM

## 2019-09-17 LAB — CSF CELL COUNT WITH DIFFERENTIAL
RBC Count, CSF: 1 /mm3 — ABNORMAL HIGH
Tube #: 3
WBC, CSF: 2 /mm3 (ref 0–5)

## 2019-09-17 LAB — GLUCOSE, CSF: Glucose, CSF: 52 mg/dL (ref 40–70)

## 2019-09-17 LAB — PROTEIN, CSF: Total  Protein, CSF: 39 mg/dL (ref 15–45)

## 2019-09-17 MED ORDER — LIDOCAINE HCL (PF) 1 % IJ SOLN
5.0000 mL | Freq: Once | INTRAMUSCULAR | Status: AC
  Administered 2019-09-17: 11:00:00 5 mL via INTRADERMAL

## 2019-09-17 MED ORDER — ACETAMINOPHEN 500 MG PO TABS
1000.0000 mg | ORAL_TABLET | Freq: Four times a day (QID) | ORAL | Status: DC | PRN
  Filled 2019-09-17: qty 2

## 2019-09-17 NOTE — Discharge Instructions (Signed)
Lumbar Puncture, Care After °This sheet gives you information about how to care for yourself after your procedure. Your health care provider may also give you more specific instructions. If you have problems or questions, contact your health care provider. °What can I expect after the procedure? °After the procedure, it is common to have: °· Mild discomfort or pain at the puncture site. °· A mild headache that is relieved with pain medicines. °Follow these instructions at home: °Activity ° °· Lie down flat or rest for as long as directed by your health care provider. °· Return to your normal activities as told by your health care provider. Ask your health care provider what activities are safe for you. °· Avoid lifting anything heavier than 10 lb (4.5 kg) for at least 12 hours after the procedure. °· Do not drive for 24 hours if you were given a medicine to help you relax (sedative) during your procedure. °· Do not drive or use heavy machinery while taking prescription pain medicine. °Puncture site care °· Remove or change your bandage (dressing) as told by your health care provider. °· Check your puncture area every day for signs of infection. Check for: °? More pain. °? Redness or swelling. °? Fluid or blood leaking from the puncture site. °? Warmth. °? Pus or a bad smell. °General instructions °· Take over-the-counter and prescription medicines only as told by your health care provider. °· Drink enough fluids to keep your urine clear or pale yellow. Your health care provider may recommend drinking caffeine to prevent a headache. °· Keep all follow-up visits as told by your health care provider. This is important. °Contact a health care provider if: °· You have fever or chills. °· You have nausea or vomiting. °· You have a headache that lasts for more than 2 days or does not get better with medicine. °Get help right away if: °· You develop any of the following in your  legs: °? Weakness. °? Numbness. °? Tingling. °· You are unable to control when you urinate or have a bowel movement (incontinence). °· You have signs of infection around your puncture site, such as: °? More pain. °? Redness or swelling. °? Fluid or blood leakage. °? Warmth. °? Pus or a bad smell. °· You are dizzy or you feel like you might faint. °· You have a severe headache, especially when you sit or stand. °Summary °· A lumbar puncture is a procedure in which a small needle is inserted into the lower back to remove fluid that surrounds the brain and spinal cord. °· After this procedure, it is common to have a headache and pain around the needle insertion area. °· Lying flat, staying hydrated, and drinking caffeine can help prevent headaches. °· Monitor your needle insertion site for signs of infection, including warmth, fluid, or more pain. °· Get help right away if you develop leg weakness, leg numbness, incontinence, or severe headaches. °This information is not intended to replace advice given to you by your health care provider. Make sure you discuss any questions you have with your health care provider. °Document Released: 12/16/2013 Document Revised: 01/24/2017 Document Reviewed: 01/24/2017 °[employer] Patient Education © 2020 [employer] ° °

## 2019-09-17 NOTE — Progress Notes (Signed)
Radiology Dr Jobe Igo called to verify bed rest/ pt to be Dc in 1 hour

## 2019-09-18 LAB — CYTOLOGY - NON PAP

## 2019-09-19 ENCOUNTER — Inpatient Hospital Stay: Payer: Medicare Other | Admitting: Hematology

## 2019-09-19 ENCOUNTER — Other Ambulatory Visit: Payer: Self-pay

## 2019-09-19 VITALS — BP 130/78 | HR 66 | Temp 98.3°F | Resp 17 | Ht 70.0 in | Wt 154.2 lb

## 2019-09-19 DIAGNOSIS — C8581 Other specified types of non-Hodgkin lymphoma, lymph nodes of head, face, and neck: Secondary | ICD-10-CM | POA: Diagnosis not present

## 2019-09-19 DIAGNOSIS — C8599 Non-Hodgkin lymphoma, unspecified, extranodal and solid organ sites: Secondary | ICD-10-CM | POA: Diagnosis not present

## 2019-09-19 NOTE — Progress Notes (Signed)
HEMATOLOGY/ONCOLOGY CLINIC NOTE  Date of Service: 09/19/2019  Patient Care Team: Derinda Late, MD as PCP - General (Family Medicine) Bettina Gavia Hilton Cork, MD as PCP - Cardiology (Cardiology) Jacolyn Reedy, MD as Consulting Physician (Cardiology)  CHIEF COMPLAINTS/PURPOSE OF CONSULTATION:  Intraocular CD10+  Lymphoma   HISTORY OF PRESENTING ILLNESS:   Jack James is a wonderful 85 y.o. male who has been referred to [place] by Dr Mariana Single for evaluation and management of newly diagnosed rt vitreo-retinal Intraocular CD10+ Lymphoma. Pt is accompanied today by his daughter Jack James, by phone. The pt reports that he is doing well overall.  The pt reports that he doesn't feel significantly different than he did 6 months ago. His eye issues began in July 2020 and and he went to see Dr. Renae Fickle. He noticed blurry vision in his right eye, which has gotten progressively worse since his cataract surgery. After the procedure Dr. Renae Fickle plugged the tear ducts in his right eye and has been treating it as dry eye. Pt was put on Prednisone eye drops for 10 days prior to his surgery. There was no indication from her that his ocular irritation is from a viral infection. As per patient here was no concern from Dr. Posey Pronto that there was a tumor in his left eye.  Pt has been taking Plavix for his chest pains, which was not stopped for his cataract surgery. Pt has not had any additional imaging studies done to his knowledge. Pt does have a cardiac stent and reports no other metal in his body. He notes no chemical exposure and no known blood transfusions.  Pt is currently active, walking about a mile every morning. He is limited by arthritis but not SOB or chest pain.   Of note prior to the patient's visit today, pt has had Flow Cytometry completed on 08/12/2019 with results revealing "CD10+ clonal B cell population detected."   Most recent lab results (05/14/2019) of CBC w/diff & CMP is as follows: WBC at  5.0K, RBC at 4.28, Hgb at 13.5, HCT at 41.3, MCV at 96.5, MCH at 31.5, MCHC at 32.7, Platelets at 222K, Neutrophil Rel at 60.7, Neutro Abs at 2.0K, Lymphocyte Rel at 25.8, Lymphocyte Abs at 1.28, Monocyte Rel at 11.9, Monocyte Abs at 0.59, Glucose at 89, Calcium at 9.4, Potassium at 4.2, BUN at 19, Creatinine at 1.03, Albumin at 4.4, Total Protein at 6.6, Alkaline Phosphatase at 66.  05/14/2019 Uric Acid at 3.0 05/14/2019 TSH at 0.720  On review of systems, pt reports blurry vision and denies headaches, issues speaking/ swallowing, loss of motor control, new lumps/bumps, fevers, chills, night sweats, chest pains, spine pain, abdominal pain, trouble urinating, testicular pain/swelling and any other symptoms.   On PMHx the pt reports Non-melanoma skin cancer, CAD, CHF, s/p mitrial valve repair, s/p CABG x 3.   INTERVAL HISTORY:  Jack James is a 85 y.o. male here for evaluation and management of Intraocular CD10+  Lymphoma. The patient's last visit with [place] was on 08/27/2019. He is joined today by his daughter Jack James, via phone. The pt reports that he is doing well overall.  The pt reports that he began to experience some headaches and neck stiffness yesterday after his Lumbar Puncture. He states that they are centralized to the back of his head and are better when he lays down. His headaches have stayed about the same and he has been using Tylenol to assist. On the date of our last meeting pt saw  Dr. Luberta Mutter his Optometrist who noted that his intraocular inflammation had returned. Pt will see Dr. Posey Pronto on 09/30.   Of note since the patient's last visit, pt has had cytology ([id number]) completed on 09/17/2019 with results revealing "No malignant cells identified" Pt has had Lumbar Puncture ([id number]) completed on 09/17/2019 with results revealing "1. Technically successful fluoroscopic guided lumbar puncture via a right paramedian approach at L3-4. 2. Normal opening pressure of 11.0 cm  water 3. 11.0 cm of clear CSF obtained for laboratory studies." Pt has had MRI Brain/Orbits ([id number]) ([id number]) completed on 09/08/2019 with results revealing "1. Normal MRI appearance of the orbits. No acute or focal enhancement or mass lesion. 2. Normal MRI the brain for age." Pt has had PET/CT Whole Body Scan ([id number]) completed on 09/05/2019 with results revealing "1. No evidence of orbital lymphoma by FDG PET imaging. 2. No evidence extra orbital lymphoma on skull base to thigh FDG PET scan 3. Bilateral small pulmonary nodules are favored benign and unrelated to lymphoma. No follow-up needed if patient is low-risk. Non-contrast chest CT can be considered in 12 months if patient is high-risk."  Lab results (09/05/19) of CBC w/diff and CMP is as follows: all values are WNL except for RBC at 3.96, Hgb at 12.9, Total Bilirubin at 1.3. 09/05/2019 LDH at 182 09/05/2019 Hep B Core Total Ab is "Negative"  09/05/2019 Hep B Surface Ag is "Negative"  09/05/2019 HIV Antibody is "Non Reactive"  09/05/2019 Hepatitis C antibody at <0.1   On review of systems, pt reports headaches, neck stiffness and denies any other symptoms.    MEDICAL HISTORY:  Past Medical History:  Diagnosis Date   Acute on chronic diastolic heart failure ([place]) 11/19/2015   Atrial fibrillation ([place]) 01/20/2016   postoperative    CAD (coronary artery disease), native coronary artery 08/04/2010   Cath 2011 with severe proximal LAD stenosis treated with 2.75 x 24 mm ion stent postdilated to 3.0 mm by Dr. Burt Knack  Catheterization 2016 shows 40% ostial LAD, patent stent, 50% mid LAD, 60% circumflex, and 40% RCA stenosis with severe mitral regurgitation.    Chronic diastolic CHF (congestive heart failure) ([facility])    Coronary artery disease    a. 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   LIMA to LAD, SVG to RCA, SVG to OM2, EVH via right thigh    S/P mitral valve repair 11/25/2015   Complex valvuloplasty including quadrangular resection of posterior leaflet, artificial Gore-tex neochord placement x10 and 32 mm Sorin Memo 3D Rechord ring annuloplasty   Skin cancer    Stroke [facility])     SURGICAL HISTORY: Past Surgical History:  Procedure Laterality Date   CARDIAC CATHETERIZATION N/A 11/19/2015   Procedure: Left Heart Cath and Coronary Angiography;  Surgeon: Burnell Blanks, MD;  Location: [place] [facility];  Service: Cardiovascular;  Laterality: N/A;   CATARACT EXTRACTION, BILATERAL  2017   CLIPPING OF ATRIAL APPENDAGE  11/25/2015   Procedure: CLIPPING OF ATRIAL APPENDAGE;  Surgeon: Rexene Alberts, MD;  Location: [place];  Service: Open Heart Surgery;;   CORONARY ARTERY BYPASS GRAFT N/A 11/25/2015   Procedure: CORONARY ARTERY BYPASS GRAFTING (CABG) TIMES THREE USING LEFT INTERNAL MAMMARY ARTERY AND RIGHT SAPHENOUS LEG VEIN HARVESTED ENDOSCOPICALLY;  Surgeon: Rexene Alberts,  MD;  Location: [facility];  Service: Open Heart Surgery;  Laterality: N/A;   CORONARY STENT PLACEMENT  2011   DES to proximal LAD   LEFT HEART CATH AND CORS/GRAFTS ANGIOGRAPHY N/A 05/02/2019   Procedure: LEFT HEART CATH AND CORS/GRAFTS ANGIOGRAPHY;  Surgeon: Belva Crome, MD;  Location: [place] [facility];  Service: Cardiovascular;  Laterality: N/A;   LITHOTRIPSY     MITRAL VALVE REPAIR N/A 11/25/2015   Procedure: MITRAL VALVE REPAIR (MVR);  Surgeon: Rexene Alberts, MD;  Location: [place];  Service: Open Heart Surgery;  Laterality: N/A;   SKIN CANCER EXCISION     TEE WITHOUT CARDIOVERSION N/A 11/22/2015   Procedure: TRANSESOPHAGEAL ECHOCARDIOGRAM (TEE);  Surgeon: [name], MD;  Location: [facility];  Service: Cardiovascular;  Laterality: N/A;   TEE WITHOUT CARDIOVERSION N/A 11/25/2015   Procedure: TRANSESOPHAGEAL  ECHOCARDIOGRAM (TEE);  Surgeon: Rexene Alberts, MD;  Location: [place];  Service: Open Heart Surgery;  Laterality: N/A;    SOCIAL HISTORY: Social History   Socioeconomic History   Marital status: Widowed    Spouse name: Not on file   Number of children: 2   Years of education: Not on file   Highest education level: Not on file  Occupational History   Occupation: Retired  Scientist, product/process development strain: Not on file   Food insecurity    Worry: Not on file    Inability: Not on Lexicographer needs    Medical: Not on file    Non-medical: Not on file  Tobacco Use   Smoking status: Never Smoker   Smokeless tobacco: Never Used  Substance and Sexual Activity   Alcohol use: Yes    Alcohol/week: 1.0 standard drinks    Types: 1 Glasses of wine per week    Comment: 1 glass of wine 3 - 4 times per week    Drug use: No   Sexual activity: Not on file  Lifestyle   Physical activity    Days per week: Not on file    Minutes per session: Not on file   Stress: Not on file  Relationships   Social connections    Talks on phone: Not on file    Gets together: Not on file    Attends religious service: Not on file    Active member of club or organization: Not on file    Attends meetings of clubs or organizations: Not on file    Relationship status: Not on file   Intimate partner violence    Fear of current or ex partner: Not on file    Emotionally abused: Not on file    Physically abused: Not on file    Forced sexual activity: Not on file  Other Topics Concern   Not on file  Social History Narrative   Lives at home alone   Right-handed   Caffeine: 2 mugs of decaf coffee per day, green tea    FAMILY HISTORY: Family History  Problem Relation Age of Onset   Heart failure Mother    Stroke Sister    Lung cancer Brother     ALLERGIES:  is allergic to sulfa antibiotics.  MEDICATIONS:  Current Outpatient Medications  Medication Sig Dispense  Refill   acebutolol (SECTRAL) 200 MG capsule Take 1 capsule (200 mg total) by mouth daily. 90 capsule 1   amoxicillin (AMOXIL) 500 MG capsule Take 2,000 mg by mouth See admin instructions. Take 2000 mg 1 hour prior to  dental work  4   atorvastatin (LIPITOR) 20 MG tablet Take 20 mg by mouth every evening.      Carboxymeth-Glyc-Polysorb PF (REFRESH OPTIVE MEGA-3) 0.5-1-0.5 % SOLN Place 1 drop into the right eye 6 (six) times daily.     cholecalciferol (VITAMIN D) 1000 UNITS tablet Take 1,000 Units by mouth 2 (two) times daily.      clopidogrel (PLAVIX) 75 MG tablet Take 75 mg by mouth daily.      famotidine (PEPCID) 10 MG tablet Take 10 mg by mouth as needed for heartburn or indigestion.     hyoscyamine (LEVSIN SL) 0.125 MG SL tablet Place 0.125 mg under the tongue every 6 (six) hours as needed for cramping.      ibuprofen (ADVIL) 200 MG tablet Take 200 mg by mouth every 8 (eight) hours as needed for headache.     isosorbide mononitrate (IMDUR) 30 MG 24 hr tablet Take 1 tablet (30 mg total) by mouth daily. 90 tablet 2   montelukast (SINGULAIR) 10 MG tablet Take 10 mg by mouth at bedtime.     nitroGLYCERIN (NITROSTAT) 0.4 MG SL tablet Take 1 tablet 10-15 minutes before walking in the morning. (Patient taking differently: Place 0.4 mg under the tongue every 5 (five) minutes as needed for chest pain. ) 30 tablet 11   prednisoLONE acetate (PRED FORTE) 1 % ophthalmic suspension Place 1 drop into the right eye 3 (three) times daily.     ranolazine (RANEXA) 500 MG 12 hr tablet Take 1 tablet (500 mg total) by mouth 2 (two) times daily. 180 tablet 1   No current facility-administered medications for this visit.     REVIEW OF SYSTEMS:   A 10+ POINT REVIEW OF SYSTEMS WAS OBTAINED including neurology, dermatology, psychiatry, cardiac, respiratory, lymph, extremities, GI, GU, Musculoskeletal, constitutional, breasts, reproductive, HEENT.  All pertinent positives are noted in the HPI.  All others  are negative.    PHYSICAL EXAM: ECOG FS:2 - Symptomatic, <50% confined to bed  There were no vitals filed for this visit. Wt Readings from Last 3 Encounters:  08/27/19 154 lb 14.4 oz (70.3 kg)  07/03/19 156 lb 3.2 oz (70.9 kg)  06/04/19 153 lb 6.4 oz (69.6 kg)   Body mass index is 22.13 kg/m.    GENERAL:alert, in no acute distress and comfortable SKIN: no acute rashes, no significant lesions EYES: conjunctiva are pink and non-injected, sclera anicteric OROPHARYNX: MMM, no exudates, no oropharyngeal erythema or ulceration NECK: supple, no JVD LYMPH:  no palpable lymphadenopathy in the cervical, axillary or inguinal regions LUNGS: clear to auscultation b/l with normal respiratory effort HEART: regular rate & rhythm ABDOMEN:  normoactive bowel sounds , non tender, not distended. Extremity: no pedal edema PSYCH: alert & oriented x 3 with fluent speech NEURO: no focal motor/sensory deficits   LABORATORY DATA:  I have reviewed the data as listed  . CBC Latest Ref Rng & Units 09/05/2019 05/03/2019 05/02/2019  WBC 4.0 - 10.5 K/uL 5.1 4.9 5.1  Hemoglobin 13.0 - 17.0 g/dL 12.9(L) 12.6(L) 13.7  Hematocrit 39.0 - 52.0 % 39.4 39.0 42.6  Platelets 150 - 400 K/uL 204 176 184   CMP Latest Ref Rng & Units 09/05/2019 05/03/2019 05/02/2019  Glucose 70 - 99 mg/dL 90 94 92  BUN 8 - 23 mg/dL 20 14 15   Creatinine 0.61 - 1.24 mg/dL 0.97 0.83 0.89  Sodium 135 - 145 mmol/L 135 135 136  Potassium 3.5 - 5.1 mmol/L 4.0 4.3 3.7  Chloride 98 -  111 mmol/L 101 101 100  CO2 22 - 32 mmol/L 27 27 25   Calcium 8.9 - 10.3 mg/dL 9.1 8.8(L) 9.4  Total Protein 6.5 - 8.1 g/dL 6.8 - -  Total Bilirubin 0.3 - 1.2 mg/dL 1.3(H) - -  Alkaline Phos 38 - 126 U/L 57 - -  AST 15 - 41 U/L 23 - -  ALT 0 - 44 U/L 13 - -    09/17/2019 Cytology ([id number])    RADIOGRAPHIC STUDIES: I have personally reviewed the radiological images as listed and agreed with the findings in the report. Mr Jeri Cos Wo Contrast  Result  Date: 09/08/2019 CLINICAL DATA:  Old ocular lymphoma. Primary central nervous system lymphoma. EXAM: MRI HEAD AND ORBITS WITHOUT AND WITH CONTRAST TECHNIQUE: Multiplanar, multiecho pulse sequences of the brain and surrounding structures were obtained without and with intravenous contrast. Multiplanar, multiecho pulse sequences of the orbits and surrounding structures were obtained including fat saturation techniques, before and after intravenous contrast administration. CONTRAST:  37mL GADAVIST GADOBUTROL 1 MMOL/ML IV SOLN COMPARISON:  MRI of the head and orbits without and with contrast 12/06/2016 FINDINGS: MRI HEAD FINDINGS Brain: Left middle cranial fossa arachnoid cyst is again seen. Mild atrophy and white matter changes are within normal limits for age. No acute infarct, hemorrhage, or mass lesion is present. Insert normal ventricles insert normal enhancement No significant extraaxial fluid collection is present. Vascular: Flow is present in the major intracranial arteries. Skull and upper cervical spine: The craniocervical junction is normal. Upper cervical spine is within normal limits. Marrow signal is unremarkable. MRI ORBITS FINDINGS Orbits: Bilateral lens replacements are present. Globes and orbits are otherwise within normal limits. There is no mass lesion or enhancement. Lacrimal gland is normal bilaterally. Extraocular muscles are normal. The optic nerve is normal bilaterally. There is some loss of fat saturation which may related to non removal dental hardware. Visualized sinuses: The paranasal sinuses and mastoid air cells are clear. Soft tissues: Periorbital soft tissues are within normal limits. Limited intracranial: The optic nerves and chiasm are within normal limits. Pituitary stalk is midline. Cavernous sinus is normal bilaterally. IMPRESSION: 1. Normal MRI appearance of the orbits. No acute or focal enhancement or mass lesion. 2. Normal MRI the brain for age. Electronically Signed   By:  San Morelle M.D.   On: 09/08/2019 17:22   Nm Pet Image Initial (pi) Skull Base To Thigh  Result Date: 09/05/2019 CLINICAL DATA:  Initial treatment strategy for intra-ocular lymphoma versus CNS lymphoma versus stomach lymphoma with secondary involvement. EXAM: NUCLEAR MEDICINE PET SKULL BASE TO THIGH TECHNIQUE: 7.7 mCi F-18 FDG was injected intravenously. Full-ring PET imaging was performed from the skull base to thigh after the radiotracer. CT data was obtained and used for attenuation correction and anatomic localization. Fasting blood glucose: 91 mg/dl COMPARISON:  None. FINDINGS: Mediastinal blood pool activity: SUV max 2.0 Liver activity: SUV max NA NECK: Typical rectus muscle activity in the orbits. No hypermetabolic activity in the orbits to suggest lymphoma. No hypermetabolic cervical lymph nodes. Incidental CT findings: Orbits grossly normal. CHEST: No hypermetabolic mediastinal or hilar nodes. No suspicious pulmonary nodules on the CT scan. Incidental CT findings: 4 mm nodule in the RIGHT upper lobe (image 83/4). Angular nodule in the RIGHT lower lobe measuring 3 mm (image 110/4). Small nodule in LEFT lower lobe measuring 5 mm (image 111/4). Biapical pleuroparenchymal scarring. ABDOMEN/PELVIS: No abnormal hypermetabolic activity within the liver, pancreas, adrenal glands, or spleen. No hypermetabolic lymph nodes in the abdomen or  pelvis. Incidental CT findings: Small free fluid the pelvis. SKELETON: No focal hypermetabolic activity to suggest skeletal metastasis. Incidental CT findings: none IMPRESSION: 1. No evidence of orbital lymphoma by FDG PET imaging. 2. No evidence extra orbital lymphoma on skull base to thigh FDG PET scan 3. Bilateral small pulmonary nodules are favored benign and unrelated to lymphoma. No follow-up needed if patient is low-risk. Non-contrast chest CT can be considered in 12 months if patient is high-risk. This recommendation follows the consensus statement: Guidelines  for Management of Incidental Pulmonary Nodules Detected on CT Images: From the Fleischner Society 2017; Radiology 2017; 284:228-243. Electronically Signed   By: Suzy Bouchard M.D.   On: 09/05/2019 10:41   Dg Fl Guided Lumbar Puncture  Result Date: 09/17/2019 CLINICAL DATA:  Intra-ocular lymphoma. EXAM: DIAGNOSTIC LUMBAR PUNCTURE UNDER FLUOROSCOPIC GUIDANCE FLUOROSCOPY TIME:  Radiation Exposure Index (if provided by the fluoroscopic device): 10.26 uGy*m2 PROCEDURE: Informed consent was obtained from the patient prior to the procedure, including potential complications of headache, allergy, and pain. With the patient prone, the lower back was prepped with Betadine. 1% Lidocaine was used for local anesthesia. Lumbar puncture was performed at the right paramedian L3-4 level using a 20 gauge needle with return of clear CSF with an opening pressure of 11 cm water. 11.0 ml of CSF were obtained for laboratory studies. The patient tolerated the procedure well and there were no apparent complications. IMPRESSION: 1. Technically successful fluoroscopic guided lumbar puncture via a right paramedian approach at L3-4. 2. Normal opening pressure of 11.0 cm water 3. 11.0 cm of clear CSF obtained for laboratory studies. Electronically Signed   By: San Morelle M.D.   On: 09/17/2019 10:56   Mr Orbits W Wo Contrast  Result Date: 09/08/2019 CLINICAL DATA:  Old ocular lymphoma. Primary central nervous system lymphoma. EXAM: MRI HEAD AND ORBITS WITHOUT AND WITH CONTRAST TECHNIQUE: Multiplanar, multiecho pulse sequences of the brain and surrounding structures were obtained without and with intravenous contrast. Multiplanar, multiecho pulse sequences of the orbits and surrounding structures were obtained including fat saturation techniques, before and after intravenous contrast administration. CONTRAST:  80mL GADAVIST GADOBUTROL 1 MMOL/ML IV SOLN COMPARISON:  MRI of the head and orbits without and with contrast 12/06/2016  FINDINGS: MRI HEAD FINDINGS Brain: Left middle cranial fossa arachnoid cyst is again seen. Mild atrophy and white matter changes are within normal limits for age. No acute infarct, hemorrhage, or mass lesion is present. Insert normal ventricles insert normal enhancement No significant extraaxial fluid collection is present. Vascular: Flow is present in the major intracranial arteries. Skull and upper cervical spine: The craniocervical junction is normal. Upper cervical spine is within normal limits. Marrow signal is unremarkable. MRI ORBITS FINDINGS Orbits: Bilateral lens replacements are present. Globes and orbits are otherwise within normal limits. There is no mass lesion or enhancement. Lacrimal gland is normal bilaterally. Extraocular muscles are normal. The optic nerve is normal bilaterally. There is some loss of fat saturation which may related to non removal dental hardware. Visualized sinuses: The paranasal sinuses and mastoid air cells are clear. Soft tissues: Periorbital soft tissues are within normal limits. Limited intracranial: The optic nerves and chiasm are within normal limits. Pituitary stalk is midline. Cavernous sinus is normal bilaterally. IMPRESSION: 1. Normal MRI appearance of the orbits. No acute or focal enhancement or mass lesion. 2. Normal MRI the brain for age. Electronically Signed   By: San Morelle M.D.   On: 09/08/2019 17:22    ASSESSMENT & PLAN:  DELSIN ALIRES is a 85 y.o. male with:  1) Newly diagnosed right vitreoretinal CD10 positive lymphoma. The sample of the vitreous sent had poor cellular viability of 9% likely related to use of steroid eyedrops for about 10 days prior to the vitrectomy. Unable to determine if these lymphocytes were small and consistent with follicular lymphoma versus a more large cell aggressive B-cell lymphoma such as diffuse large B-cell lymphoma or Burkitt's. With his presentation a diffuse large B-cell lymphoma is the most likely  concern.  Uncertain at this time if this is a primary intraocular lymphoma were versus an orbital lymphoma versus a primary CNS lymphoma with eye involvement versus systemic lymphoma with secondary eye involvement.   PLAN: -Discussed pt labwork, 09/05/19; all values are WNL except for RBC at 3.96, Hgb at 12.9, Total Bilirubin at 1.3. -Discussed 09/05/2019 LDH at 182 -Discussed 09/05/2019 Hep B Core Total Ab is "Negative"  -Discussed 09/05/2019 Hep B Surface Ag is "Negative"  -Discussed 09/05/2019 HIV Antibody is "Non Reactive"  -Discussed 09/05/2019 Hepatitis C antibody at <0.1 -Discussed 09/17/2019 Cytology ([id number]) with results revealing "No malignant cells identified" -Discussed 09/17/2019 Lumbar Puncture ([id number]) which revealed "1. Technically successful fluoroscopic guided lumbar puncture via a right paramedian approach at L3-4. 2. Normal opening pressure of 11.0 cm water 3. 11.0 cm of clear CSF obtained for laboratory studies." -Discussed 09/08/2019 MRI Brain/Orbits ([id number]) ([id number]) which revealed "1. Normal MRI appearance of the orbits. No acute or focal enhancement or mass lesion. 2. Normal MRI the brain for age." -Discussed 09/05/2019 PET/CT Whole Body Scan ([id number]) which revealed "1. No evidence of orbital lymphoma by FDG PET imaging. 2. No evidence extra orbital lymphoma on skull base to thigh FDG PET scan 3. Bilateral small pulmonary nodules are favored benign and unrelated to lymphoma. No follow-up needed if patient is low-risk. Non-contrast chest CT can be considered in 12 months if patient is high-risk." -Advised pt to intake water, salty foods, caffeine; avoid much movement and bearing down during bowel movements to improve spinal headaches -No overt, visible evidence of spread of Intraocular Lymphoma  -Advised that pathologist could not give a definitive type of NHL due to the limited sample  -Will speak to Dr. Posey Pronto to see if he could re-biopsy occular  lesion and if there would be any advantage of referring to [facility]. -Advised that most Ocular Lymphomas tend to be high grade Lymphomas -Discussed low grade Lymphoma vs high grade Lymphoma treatments  -High grade lymphoma treatment: 3 cycles of R-CHOP one every 3 weeks with brain protective chemotherapy (intrathecal methotrexate)+ local radiation to his eye -Low grade lymphoma treatment: Rituxan, once a week x4 with brain protective chemotherapy (intrathecal methotrexate) + local radiation to his eye -Advised pt to keep upcoming appointment with Dr. Posey Pronto -case discussed with Dr Jolayne Haines at [facility] as well for her opinion. Will ask patient/daughter about consider referral for 2nd opinion.  FOLLOW UP: Based on discussion with Dtr Posey Pronto  The total time spent in the appt was 40 minutes and more than 50% was on counseling and direct patient cares.  All of the patient's questions were answered with apparent satisfaction. The patient knows to call the clinic with any problems, questions or concerns.    Sullivan Lone MD [place] AAHIVMS [facility] Hematology/Oncology Physician [facility]  (Office):       [phone] (Work cell):  [phone] (Fax):           [phone]  09/19/2019 8:47 AM  I, Yevette Edwards, am acting as a Education administrator for Dr. Sullivan Lone.   .I have reviewed the above documentation for accuracy and completeness, and I agree with the above. Brunetta Genera MD

## 2019-09-20 LAB — CSF CULTURE W GRAM STAIN: Culture: NO GROWTH

## 2019-09-22 ENCOUNTER — Telehealth: Payer: Self-pay | Admitting: Hematology

## 2019-09-22 NOTE — Telephone Encounter (Signed)
No los per 9/25 °

## 2019-09-23 ENCOUNTER — Other Ambulatory Visit: Payer: Self-pay | Admitting: Hematology

## 2019-09-24 ENCOUNTER — Other Ambulatory Visit: Payer: Self-pay | Admitting: Cardiology

## 2019-09-26 ENCOUNTER — Other Ambulatory Visit: Payer: Self-pay | Admitting: Hematology

## 2019-09-26 NOTE — Progress Notes (Signed)
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 Patient was seen by Dr. Posey Pronto his local retina specialist.  Daughter mentioned that Dr. Posey Pronto did give the patient a referral to [facility] and the steroid eyedrops have been held to allow for better sampling and diagnostic work-up of his right eye intraocular lymphoma. I discussed that this is in keeping with what I would recommend and what I discussed with Dr. Posey Pronto previously. I also recommended that it would be appropriate to get a second medical oncology opinion in [place] as well. She will let me know if she does not hear from the [place] by Monday. We shall see him back after he has been seen at the [facility] and by [place] oncology. Daughter notes that she prefers to continue medical oncology follow-up locally but does appreciate the second opinion.  Sullivan Lone MD MS Hematology/Oncology Physician [facility]

## 2019-10-02 ENCOUNTER — Telehealth: Payer: Self-pay | Admitting: *Deleted

## 2019-10-02 NOTE — Telephone Encounter (Signed)
Follow up on Dr. Grier Mitts recommended referrals to [facility] and Dr. Kenn File w/[facility]: Sheela Stack w/ Dr. Serita Grit office ([place]) [phone] to ask if Dr. Posey Pronto was providing referral. She stated that Dr.Patel would be making both referrals for patient. 09/30/2019: Contacted by Margarita Grizzle w/Dr. Patel's office. Referrals to both providers were made by Dr.Patel.  Patient to have appointments within next 2 weeks. Per Margarita Grizzle, patient's daughter Mardella Layman was also informed of plan and is in agreement.

## 2019-10-07 ENCOUNTER — Ambulatory Visit: Payer: Medicare Other | Admitting: Cardiology

## 2019-10-19 NOTE — Progress Notes (Signed)
Cardiology Office Note:    Date:  10/21/2019   ID:  QUADARIUS HENTON, DOB July 17, 1934, MRN [id number]  PCP:  Derinda Late, MD  Cardiologist:  Shirlee More, MD    Referring MD: Derinda Late, MD    ASSESSMENT:    1. MVP (mitral valve prolapse)   2. Chronic diastolic CHF (congestive heart failure) ([place])   3. Atrial fibrillation, unspecified type ([place])   4. S/P CABG x 3   5. S/P mitral valve repair + CABG x3   6. Hyperlipidemia, unspecified hyperlipidemia type    PLAN:    In order of problems listed above:  1. Stable mitral valve disease after mitral valve repair 2. Stable compensated heart failure is only not on a loop diuretic 3. Stable continue beta-blocker in sinus rhythm 4. CAD I will reduce the dose of his beta-blocker and continue current treatment.  I told the patient and his daughter that if need be can withdraw aspirin and clopidogrel for diagnostic or therapeutic measures for his lymphoma and that I do not consider his heart disease a contraindication to treatment. 5. Continue with statin check liver function lipid profile with fatigue   Next appointment: 3 months   Medication Adjustments/Labs and Tests Ordered: Current medicines are reviewed at length with the patient today.  Concerns regarding medicines are outlined above.  Orders Placed This Encounter  Procedures  . CBC  . Comp Met (CMET)  . Lipid Profile  . TSH   Meds ordered this encounter  Medications  . acebutolol (SECTRAL) 200 MG capsule    Sig: Take 1 capsule (200 mg total) by mouth every other day.    Chief Complaint  Patient presents with  . Follow-up    after CABG and MVR 2016  . Coronary Artery Disease  . Hyperlipidemia  . Hypertension    History of Present Illness:    Jack James is a 85 y.o. male with a hx of CAD with PCI of LAD 2011, CABG and MVR in 2016 for severe MR with MVP,hyperlipidemia and stroke 06/04/2019 He was seen on consultation by Dr. Gabriela Eves [facility] 05/02/2019 after he was admitted to the hospital with shortness of breath and exertional chest pain.  He underwent left heart catheterization he had 70% mid LAD disease with a patent left thoracic artery although it is a atretic saphenous vein graft was patent to the distal right coronary artery with total occlusion of the right coronary artery saphenous vein graft was patent to left circumflex and 60% proximal stenosis.  He was not felt to require any further revascularization and he was not felt to have mitral valve dysfunction needing further surgical intervention.  A subsequent myocardial perfusion study was ordered by Dr. Geraldo Pitter showed an ejection fraction of 60% there is no EKG ischemia His medical care was optimized and having ranolazine and asked Dr. Burt Knack to review his film and he told me he did not see a target for revascularization percutaneously and has been maintained on medical therapy clopidogrel beta-blocker oral nitrate and ranolazine along with his high intensity statin. He has a newly diagnosed right vitreoretinal CD10 positive lymphoma and has had initial evaluation and second opinion at [place] and is awaiting a final decision on treatment local versus systemic.  He was last seen 07/03/2019.  Compliance with diet, lifestyle and medications: Yes  Became a complex visit mostly the concern of the patient and his daughter regarding his ocular lymphoma being evaluated at [facility]  and anxiety of waiting for decision regarding systemic or local therapy.  Intensive medical therapy is not having angina but notices fatigue will reduce the dose of his beta-blocker and check labs including liver function TSH and CBC.  No edema orthopnea palpitation or syncope. Past Medical History:  Diagnosis Date  . Acute on chronic diastolic heart failure ([place]) 11/19/2015  . Atrial fibrillation ([place]) 01/20/2016   postoperative   . CAD (coronary artery disease), native coronary  artery 08/04/2010   Cath 2011 with severe proximal LAD stenosis treated with 2.75 x 24 mm ion stent postdilated to 3.0 mm by Dr. Burt Knack  Catheterization 2016 shows 40% ostial LAD, patent stent, 50% mid LAD, 60% circumflex, and 40% RCA stenosis with severe mitral regurgitation.   . Chronic diastolic CHF (congestive heart failure) ([place])   . Coronary artery disease    a. 2011: DES to proximal LAD  . HLD (hyperlipidemia)   . Hyperlipidemia   . Mitral regurgitation 11/19/2015  . Mitral valve prolapse   . MVP (mitral valve prolapse) 11/19/2015  . Pleural effusion, bilateral 11/18/2015  . 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 S/P mitral valve repair 11/25/2015   Complex valvuloplasty including quadrangular resection of posterior leaflet, artificial Gore-tex neochord placement x10 and 32 mm Sorin Memo 3D Rechord ring annuloplasty  . Skin cancer   . Stroke [facility])     Past Surgical History:  Procedure Laterality Date  . CARDIAC CATHETERIZATION N/A 11/19/2015   Procedure: Left Heart Cath and Coronary Angiography;  Surgeon: Burnell Blanks, MD;  Location: [place] CV LAB;  Service: Cardiovascular;  Laterality: N/A;  . CATARACT EXTRACTION, BILATERAL  2017  . CLIPPING OF ATRIAL APPENDAGE  11/25/2015   Procedure: CLIPPING OF ATRIAL APPENDAGE;  Surgeon: Rexene Alberts, MD;  Location: [place];  Service: Open Heart Surgery;;  . CORONARY ARTERY BYPASS GRAFT N/A 11/25/2015   Procedure: CORONARY ARTERY BYPASS GRAFTING (CABG) TIMES THREE USING LEFT INTERNAL MAMMARY ARTERY AND RIGHT SAPHENOUS LEG VEIN HARVESTED ENDOSCOPICALLY;  Surgeon: Rexene Alberts, MD;  Location: [place];  Service: Open Heart Surgery;  Laterality: N/A;  . CORONARY STENT PLACEMENT  2011   DES to proximal LAD  . LEFT HEART CATH AND CORS/GRAFTS ANGIOGRAPHY N/A 05/02/2019   Procedure: LEFT HEART CATH AND CORS/GRAFTS ANGIOGRAPHY;  Surgeon: Belva Crome, MD;  Location: [place] CV LAB;   Service: Cardiovascular;  Laterality: N/A;  . LITHOTRIPSY    . MITRAL VALVE REPAIR N/A 11/25/2015   Procedure: MITRAL VALVE REPAIR (MVR);  Surgeon: Rexene Alberts, MD;  Location: [place];  Service: Open Heart Surgery;  Laterality: N/A;  . SKIN CANCER EXCISION    . TEE WITHOUT CARDIOVERSION N/A 11/22/2015   Procedure: TRANSESOPHAGEAL ECHOCARDIOGRAM (TEE);  Surgeon: [name], MD;  Location: [facility] ENDOSCOPY;  Service: Cardiovascular;  Laterality: N/A;  . TEE WITHOUT CARDIOVERSION N/A 11/25/2015   Procedure: TRANSESOPHAGEAL ECHOCARDIOGRAM (TEE);  Surgeon: Rexene Alberts, MD;  Location: [place];  Service: Open Heart Surgery;  Laterality: N/A;    Current Medications: Current Meds  Medication Sig  . acebutolol (SECTRAL) 200 MG capsule Take 1 capsule (200 mg total) by mouth every other day.  Marland Kitchen amoxicillin (AMOXIL) 500 MG capsule Take 2,000 mg by mouth See admin instructions. Take 2000 mg 1 hour prior to dental work  . atorvastatin (LIPITOR) 20 MG tablet Take 20 mg by mouth every evening.   Marland Kitchen  Carboxymeth-Glyc-Polysorb PF (REFRESH OPTIVE MEGA-3) 0.5-1-0.5 % SOLN Place 1 drop into the right eye 6 (six) times daily.  . cholecalciferol (VITAMIN D) 1000 UNITS tablet Take 1,000 Units by mouth 2 (two) times daily.   . clopidogrel (PLAVIX) 75 MG tablet Take 75 mg by mouth daily.   . famotidine (PEPCID) 10 MG tablet Take 10 mg by mouth as needed for heartburn or indigestion.  . hyoscyamine (LEVSIN SL) 0.125 MG SL tablet Place 0.125 mg under the tongue every 6 (six) hours as needed for cramping.   . isosorbide mononitrate (IMDUR) 30 MG 24 hr tablet Take 1 tablet (30 mg total) by mouth daily.  . montelukast (SINGULAIR) 10 MG tablet Take 10 mg by mouth at bedtime.  . nitroGLYCERIN (NITROSTAT) 0.4 MG SL tablet Take 1 tablet 10-15 minutes before walking in the morning. (Patient taking differently: Place 0.4 mg under the tongue every 5 (five) minutes as needed for chest pain. )  . ranolazine (RANEXA) 500 MG 12 hr  tablet Take 1 tablet (500 mg total) by mouth 2 (two) times daily.  . [DISCONTINUED] acebutolol (SECTRAL) 200 MG capsule TAKE 1 CAPSULE BY MOUTH EVERY DAY     Allergies:   Sulfa antibiotics   Social History   Socioeconomic History  . Marital status: Widowed    Spouse name: Not on file  . Number of children: 2  . Years of education: Not on file  . Highest education level: Not on file  Occupational History  . Occupation: Retired  Scientific laboratory technician  . Financial resource strain: Not on file  . Food insecurity    Worry: Not on file    Inability: Not on file  . Transportation needs    Medical: Not on file    Non-medical: Not on file  Tobacco Use  . Smoking status: Never Smoker  . Smokeless tobacco: Never Used  Substance and Sexual Activity  . Alcohol use: Yes    Alcohol/week: 1.0 standard drinks    Types: 1 Glasses of wine per week    Comment: 1 glass of wine 3 - 4 times per week   . Drug use: No  . Sexual activity: Not on file  Lifestyle  . Physical activity    Days per week: Not on file    Minutes per session: Not on file  . Stress: Not on file  Relationships  . Social Herbalist on phone: Not on file    Gets together: Not on file    Attends religious service: Not on file    Active member of club or organization: Not on file    Attends meetings of clubs or organizations: Not on file    Relationship status: Not on file  Other Topics Concern  . Not on file  Social History Narrative   Lives at home alone   Right-handed   Caffeine: 2 mugs of decaf coffee per day, green tea     Family History: The patient's family history includes Heart failure in his mother; Lung cancer in his brother; Stroke in his sister. ROS:   Please see the history of present illness.    All other systems reviewed and are negative.  EKGs/Labs/Other Studies Reviewed:    The following studies were reviewed today:    Recent Labs: 05/02/2019: B Natriuretic Peptide 142.4 09/05/2019: ALT  13; BUN 20; Creatinine 0.97; Hemoglobin 12.9; Platelets 204; Potassium 4.0; Sodium 135  Recent Lipid Panel    Component Value Date/Time   CHOL  140 01/06/2019 1121   TRIG 49 01/06/2019 1121   HDL 77 01/06/2019 1121   CHOLHDL 1.8 01/06/2019 1121   CHOLHDL 2.1 11/20/2015 0030   VLDL 7 11/20/2015 0030   LDLCALC 53 01/06/2019 1121    Physical Exam:    VS:  BP 122/60   Pulse (!) 56   Ht 5' 10" (1.778 m)   Wt 154 lb 3.2 oz (69.9 kg)   SpO2 98%   BMI 22.13 kg/m     Wt Readings from Last 3 Encounters:  10/21/19 154 lb 3.2 oz (69.9 kg)  09/19/19 154 lb 3.2 oz (69.9 kg)  08/27/19 154 lb 14.4 oz (70.3 kg)     GEN:  Well nourished, well developed in no acute distress HEENT: Normal NECK: No JVD; No carotid bruits LYMPHATICS: No lymphadenopathy CARDIAC: RRR, no murmurs, rubs, gallops RESPIRATORY:  Clear to auscultation without rales, wheezing or rhonchi  ABDOMEN: Soft, non-tender, non-distended MUSCULOSKELETAL:  No edema; No deformity  SKIN: Warm and dry NEUROLOGIC:  Alert and oriented x 3 PSYCHIATRIC:  Normal affect    Signed, Shirlee More, MD  10/21/2019 2:40 PM    [facility]

## 2019-10-21 ENCOUNTER — Other Ambulatory Visit: Payer: Self-pay

## 2019-10-21 ENCOUNTER — Encounter: Payer: Self-pay | Admitting: Cardiology

## 2019-10-21 ENCOUNTER — Ambulatory Visit (INDEPENDENT_AMBULATORY_CARE_PROVIDER_SITE_OTHER): Payer: Medicare Other | Admitting: Cardiology

## 2019-10-21 VITALS — BP 122/60 | HR 56 | Ht 70.0 in | Wt 154.2 lb

## 2019-10-21 DIAGNOSIS — Z951 Presence of aortocoronary bypass graft: Secondary | ICD-10-CM

## 2019-10-21 DIAGNOSIS — I341 Nonrheumatic mitral (valve) prolapse: Secondary | ICD-10-CM

## 2019-10-21 DIAGNOSIS — I5032 Chronic diastolic (congestive) heart failure: Secondary | ICD-10-CM | POA: Diagnosis not present

## 2019-10-21 DIAGNOSIS — I4891 Unspecified atrial fibrillation: Secondary | ICD-10-CM

## 2019-10-21 DIAGNOSIS — E785 Hyperlipidemia, unspecified: Secondary | ICD-10-CM

## 2019-10-21 DIAGNOSIS — Z9889 Other specified postprocedural states: Secondary | ICD-10-CM

## 2019-10-21 MED ORDER — ACEBUTOLOL HCL 200 MG PO CAPS: 200.0000 mg | ORAL_CAPSULE | ORAL | Status: DC

## 2019-10-21 NOTE — Patient Instructions (Addendum)
Medication Instructions:  Your physician has recommended you make the following change in your medication:   DECREASE acebutolol (sectral) 200 mg: Take 1 tablet every other day  *If you need a refill on your cardiac medications before your next appointment, please call your pharmacy*  Lab Work: Your physician recommends that you return for lab work today: CBC, CMP, Lipid panel, TSH.   If you have labs (blood work) drawn today and your tests are completely normal, you will receive your results only by: Marland Kitchen MyChart Message (if you have MyChart) OR . A paper copy in the mail If you have any lab test that is abnormal or we need to change your treatment, we will call you to review the results.  Testing/Procedures: None  Follow-Up: At [facility], you and your health needs are our priority.  As part of our continuing mission to provide you with exceptional heart care, we have created designated Provider Care Teams.  These Care Teams include your primary Cardiologist (physician) and Advanced Practice Providers (APPs -  Physician Assistants and Nurse Practitioners) who all work together to provide you with the care you need, when you need it.  Your next appointment:   3 months  The format for your next appointment:   In Person  Provider:   Shirlee More, MD

## 2019-10-22 LAB — LIPID PANEL
Chol/HDL Ratio: 1.7 ratio (ref 0.0–5.0)
Cholesterol, Total: 131 mg/dL (ref 100–199)
HDL: 75 mg/dL (ref >39)
LDL Chol Calc (NIH): 45 mg/dL (ref 0–99)
Triglycerides: 47 mg/dL (ref 0–149)
VLDL Cholesterol Cal: 11 mg/dL (ref 5–40)

## 2019-10-22 LAB — COMPREHENSIVE METABOLIC PANEL
ALT: 15 IU/L (ref 0–44)
AST: 25 IU/L (ref 0–40)
Albumin/Globulin Ratio: 1.9 (ref 1.2–2.2)
Albumin: 4.1 g/dL (ref 3.6–4.6)
Alkaline Phosphatase: 59 IU/L (ref 39–117)
BUN/Creatinine Ratio: 18 (ref 10–24)
BUN: 19 mg/dL (ref 8–27)
Bilirubin Total: 0.6 mg/dL (ref 0.0–1.2)
CO2: 22 mmol/L (ref 20–29)
Calcium: 9.3 mg/dL (ref 8.6–10.2)
Chloride: 96 mmol/L (ref 96–106)
Creatinine, Ser: 1.06 mg/dL (ref 0.76–1.27)
GFR calc Af Amer: 75 mL/min/{1.73_m2} (ref >59)
GFR calc non Af Amer: 65 mL/min/{1.73_m2} (ref >59)
Globulin, Total: 2.2 g/dL (ref 1.5–4.5)
Glucose: 90 mg/dL (ref 65–99)
Potassium: 4.4 mmol/L (ref 3.5–5.2)
Sodium: 134 mmol/L (ref 134–144)
Total Protein: 6.3 g/dL (ref 6.0–8.5)

## 2019-10-22 LAB — CBC
Hematocrit: 38.4 % (ref 37.5–51.0)
Hemoglobin: 12.9 g/dL — ABNORMAL LOW (ref 13.0–17.7)
MCH: 33.2 pg — ABNORMAL HIGH (ref 26.6–33.0)
MCHC: 33.6 g/dL (ref 31.5–35.7)
MCV: 99 fL — ABNORMAL HIGH (ref 79–97)
Platelets: 184 10*3/uL (ref 150–450)
RBC: 3.89 x10E6/uL — ABNORMAL LOW (ref 4.14–5.80)
RDW: 11.1 % — ABNORMAL LOW (ref 11.6–15.4)
WBC: 5.5 10*3/uL (ref 3.4–10.8)

## 2019-10-22 LAB — TSH: TSH: 1.27 u[IU]/mL (ref 0.450–4.500)

## 2019-11-06 ENCOUNTER — Ambulatory Visit: Payer: Medicare Other | Admitting: Cardiology

## 2019-11-06 DIAGNOSIS — C8599 Non-Hodgkin lymphoma, unspecified, extranodal and solid organ sites: Secondary | ICD-10-CM

## 2019-11-06 HISTORY — DX: Non-Hodgkin lymphoma, unspecified, extranodal and solid organ sites: C85.99

## 2019-12-16 ENCOUNTER — Other Ambulatory Visit: Payer: Self-pay | Admitting: Cardiology

## 2019-12-28 DIAGNOSIS — Z5111 Encounter for antineoplastic chemotherapy: Secondary | ICD-10-CM

## 2019-12-28 HISTORY — DX: Encounter for antineoplastic chemotherapy: Z51.11

## 2020-01-19 DIAGNOSIS — Z8673 Personal history of transient ischemic attack (TIA), and cerebral infarction without residual deficits: Secondary | ICD-10-CM

## 2020-01-19 HISTORY — DX: Personal history of transient ischemic attack (TIA), and cerebral infarction without residual deficits: Z86.73

## 2020-03-01 ENCOUNTER — Other Ambulatory Visit: Payer: Self-pay | Admitting: Cardiology

## 2020-03-22 ENCOUNTER — Telehealth: Payer: Self-pay | Admitting: Cardiology

## 2020-03-22 NOTE — Telephone Encounter (Signed)
Pt c/o BP issue: STAT if pt c/o blurred vision, one-sided weakness or slurred speech  1. What are your last 5 BP readings?  03/15/20: 113/67 03/16/20: 103/63 03/17/20: 112/72 03/18/20: 116/64 03/19/20: 107/63 03/20/20: 98/59 03/21/20: 104/63 03/22/20: 115/62  2. Are you having any other symptoms (ex. Dizziness, headache, blurred vision, passed out)? Fatigue, denies all these symptoms. Has some swelling on his left foot around his toes - denies SOB and CP.   3. What is your BP issue? BP is lower than normal. Daughter, Claiborne Billings, states that his BP usually runs around 118-120/70's

## 2020-03-22 NOTE — Telephone Encounter (Signed)
He really is not on any antihypertensive agents and he has not stopped his ACE pedal while on Imdur that he takes to prevent angina I think I would do what were doing and perhaps knee-high medium support hose can be helpful for lightheadedness.

## 2020-03-22 NOTE — Telephone Encounter (Signed)
Spoke with patient's daughter, Jack James, who states BP readings are being taken when he wakes up between 7-8 am. She states SBP is never >120 mmHg. They do not monitor BP after patient takes medication, so unsure of how low BP is after medication.  Patient is receiving treatment for ocular lymphoma at [facility] and is admitted for a few days to receive methotrexate. While he is hospitalized, he does not receive acebutolol because they don't it have it on formulary and Jack James states they do not report that patient has any BP issues.  She reports he has good appetite but he does not hydrate well. She states they increased water and gatorade intake over the weekend. They called [facility] over the weekend and were advised to call Dr. Bettina Gavia today. His next hospital admission is April 5. He had a normal EKG on March 9. Patient denies chest discomfort.  I advised that I will forward message to Dr. Bettina Gavia for advice and that someone from our office will call her back with his advice. Jack James thanked me for the call.

## 2020-03-22 NOTE — Telephone Encounter (Signed)
Called patient's daughter, Claiborne Billings, and reviewed Dr. Joya Gaskins advice with her. She states patient does have compression stockings from a previous surgery and she will ask him to try those. She asked what too low is for the patient's BP and I advised that she should monitor his symptoms including, lightheadedness, dizziness, or feeling like he is unsteady, in addition to the BP readings. I advised her to call back prior to his appointment if he is symptomatic with low BP. She verbalized understanding and agreement with plan and thanked me for the call.

## 2020-04-16 ENCOUNTER — Telehealth: Payer: Self-pay | Admitting: Cardiology

## 2020-04-16 NOTE — Telephone Encounter (Signed)
Patient's daughter states she would like to inquire about whether or not Dr. Bettina Gavia has access to his echocardiogram results from his visit at [facility]. Please advise.

## 2020-04-16 NOTE — Telephone Encounter (Signed)
Spoke with Claiborne Billings, the patients daughter who wanted to make sure Dr. Bettina Gavia was aware that he received an echocardiogram a couple of weeks ago at [facility]. The patient has an appointment next week with Dr. Bettina Gavia and was hoping to discuss it with him then.  Will let MD know.

## 2020-04-19 ENCOUNTER — Telehealth: Payer: Self-pay | Admitting: Cardiology

## 2020-04-19 NOTE — Telephone Encounter (Signed)
No VM set up.

## 2020-04-19 NOTE — Telephone Encounter (Signed)
Fits our guidelines if he needs his son there for cognitive reasons its appropriate

## 2020-04-19 NOTE — Telephone Encounter (Signed)
How do you advise?

## 2020-04-19 NOTE — Telephone Encounter (Signed)
New Message    Pt is calling and says he would like for his son to assist him to his appointment because he has a hard time remembering things    Please call back

## 2020-04-20 ENCOUNTER — Other Ambulatory Visit: Payer: Self-pay

## 2020-04-20 NOTE — Progress Notes (Signed)
Cardiology Office Note:    Date:  04/21/2020   ID:  Jack James, DOB 1934/07/06, MRN [id number]  PCP:  Derinda Late, MD  Cardiologist:  Shirlee More, MD    Referring MD: Derinda Late, MD    ASSESSMENT:    1. Coronary artery disease of native artery of native heart with stable angina pectoris ([place])   2. S/P CABG x 3   3. S/P mitral valve repair + CABG x3   4. Mixed hyperlipidemia   5. Localized edema    PLAN:    In order of problems listed above:  1. This became a very complex visit with a large low-lying lymph information questions from his daughter and a typed note and the son participating.  I reviewed the records from [place] from my perspective his CAD is stable he is having no angina New [place] Heart Association class I continue medical therapy including clopidogrel beta-blocker oral nitrate and ranolazine.  I do not think that these medications 70 will give his edema and I think the edema is noncardiac and not related to heart failure. 2. Stable after bypass surgery and mitral valve repair recent echocardiogram shows good function 3. Continue with statin 4. Clearly multifactorial chronic venous insufficiency sodium loading and I told him wearing knee-high medium compression support hose could be helpful.   Next appointment: 6 months   Medication Adjustments/Labs and Tests Ordered: Current medicines are reviewed at length with the patient today.  Concerns regarding medicines are outlined above.  No orders of the defined types were placed in this encounter.  No orders of the defined types were placed in this encounter.   Chief Complaint  Patient presents with  . Follow-up  . Coronary Artery Disease    History of Present Illness:    Jack James is a 86 y.o. male with a hx of CAD with PCI of LAD 2011, CABG and MVR in 2016 for severe MR with MVP,hyperlipidemia and stroke 06/04/2019 He was seen on consultation by Dr. Gabriela Eves [facility]  05/02/2019 after he was admitted to the hospital with shortness of breath and exertional chest pain.  He underwent left heart catheterization he had 70% mid LAD disease with a patent left thoracic artery although it is a atretic saphenous vein graft was patent to the distal right coronary artery with total occlusion of the right coronary artery saphenous vein graft was patent to left circumflex and 60% proximal stenosis.  He was not felt to require any further revascularization and he was not felt to have mitral valve dysfunction needing further surgical intervention.  A subsequent myocardial perfusion study was ordered by Dr. Geraldo Pitter showed an ejection fraction of 60%.  He is receiving care at [facility] most recently 03/29/2020 for an ocular non-Hodgkin's lymphoma receiving chemotherapy.  An echocardiogram performed at [facility] April 2021 which shows preserved ejection fraction EF calculated at 56%.  He had developed peripheral edema and was felt to be due to protein depletion.Marland Kitchen He wants last seen 10/21/2019.  Compliance with diet, lifestyle and medications: Yes  His son is present was involved with and participated in the interview and decision-making.  His daughter sent a list of questions.  First questions asked me about findings on eye exam with a pinched vessel whether is due to radiation for his ocular lymphoma and I told him I think he needed to speak to ophthalmology oncology about that is beyond the scope of this the second is swelling in his ankles.  Records at [facility] say that he has protein depletion however it preceded that he has obvious chronic venous insufficiency and does not have heart failure and I asked him to start wearing knee-high medium support hose.  The third is asked me review his medications and I think they are appropriate with his CAD.  They are concerned about intermittent blood pressures between 100 210 To the blood pressure has scattered predominantly in the range of 120/60 I would not  change his current medications being predominantly administered for his CAD.  Of asked me of the echocardiogram done at [facility] that had already reviewed he has normal left ventricular function no evidence of heart failure.  He also tells me that he was loaded with oral sodium and bicarbonate prior to chemotherapy likely contributing to his peripheral edema.  From my perspective he has had no angina dyspnea orthopnea palpitation or syncope and tolerates his cardiac medications.  He has not needed nitroglycerin.  Echo [facility] 03/31/2020: INTERPRETATION ---------------------------------------------------------------  NORMAL LEFT VENTRICULAR SYSTOLIC FUNCTION  NORMAL RIGHT VENTRICULAR SYSTOLIC FUNCTION  VALVULAR REGURGITATION: MILD AR, TRIVIAL MR, TRIVIAL PR, MILD TR  PROSTHETIC VALVE(S): PROSTHETIC MV RING  Recent labs [facility] 04/01/2020: CMP with a potassium of 3.6 creatinine 0.9 GFR 79 cc both albumin and protein were diminished 2.6 and 4.6  Past Medical History:  Diagnosis Date  . Acute on chronic diastolic heart failure ([place]) 11/19/2015  . Atrial fibrillation ([place]) 01/20/2016   postoperative   . Atrial fibrillation and flutter ([place]) 01/20/2016   postoperative  . CAD (coronary artery disease), native coronary artery 08/04/2010   Cath 2011 with severe proximal LAD stenosis treated with 2.75 x 24 mm ion stent postdilated to 3.0 mm by Dr. Burt Knack  Catheterization 2016 shows 40% ostial LAD, patent stent, 50% mid LAD, 60% circumflex, and 40% RCA stenosis with severe mitral regurgitation.   . Chest pain 05/02/2019  . Chronic diastolic CHF (congestive heart failure) ([place])   . Coronary artery disease    a. 2011: DES to proximal LAD  . Coronary artery disease involving coronary bypass graft of native heart 08/04/2010   Cath 2011 with severe proximal LAD stenosis treated with 2.75 x 24 mm ion stent postdilated to 3.0 mm by Dr. Burt Knack  Catheterization 2016 shows 40% ostial LAD, patent stent, 50% mid LAD, 60%  circumflex, and 40% RCA stenosis with severe mitral regurgitation.  . Encounter for antineoplastic chemotherapy 12/28/2019  . History of CVA (cerebrovascular accident) 01/19/2020  . HLD (hyperlipidemia)   . Hyperlipidemia   . Mitral regurgitation 11/19/2015  . Mitral valve prolapse   . MVP (mitral valve prolapse) 11/19/2015  . 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 Pleural effusion, bilateral 11/18/2015  . 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 S/P mitral valve repair 11/25/2015   Complex valvuloplasty including quadrangular resection of posterior leaflet, artificial Gore-tex neochord placement x10 and 32 mm Sorin Memo 3D Rechord ring annuloplasty  . Skin cancer   . Stroke ([place])   . Unstable angina ([place]) 11/19/2015    Past Surgical History:  Procedure Laterality Date  . CARDIAC CATHETERIZATION N/A 11/19/2015   Procedure: Left Heart Cath and Coronary Angiography;  Surgeon: Burnell Blanks, MD;  Location: [place] [facility];  Service: Cardiovascular;  Laterality: N/A;  . CATARACT EXTRACTION, BILATERAL  2017  . CLIPPING OF ATRIAL APPENDAGE  11/25/2015   Procedure: CLIPPING OF ATRIAL APPENDAGE;  Surgeon: Braulio Conte  Keturah Barre, MD;  Location: [place];  Service: Open Heart Surgery;;  . CORONARY ARTERY BYPASS GRAFT N/A 11/25/2015   Procedure: CORONARY ARTERY BYPASS GRAFTING (CABG) TIMES THREE USING LEFT INTERNAL MAMMARY ARTERY AND RIGHT SAPHENOUS LEG VEIN HARVESTED ENDOSCOPICALLY;  Surgeon: Rexene Alberts, MD;  Location: [place];  Service: Open Heart Surgery;  Laterality: N/A;  . CORONARY STENT PLACEMENT  2011   DES to proximal LAD  . LEFT HEART CATH AND CORS/GRAFTS ANGIOGRAPHY N/A 05/02/2019   Procedure: LEFT HEART CATH AND CORS/GRAFTS ANGIOGRAPHY;  Surgeon: Belva Crome, MD;  Location: [place] [facility];  Service: Cardiovascular;  Laterality: N/A;  . LITHOTRIPSY    . MITRAL VALVE REPAIR N/A 11/25/2015   Procedure: MITRAL VALVE REPAIR (MVR);  Surgeon:  Rexene Alberts, MD;  Location: [place];  Service: Open Heart Surgery;  Laterality: N/A;  . SKIN CANCER EXCISION    . TEE WITHOUT CARDIOVERSION N/A 11/22/2015   Procedure: TRANSESOPHAGEAL ECHOCARDIOGRAM (TEE);  Surgeon: [name], MD;  Location: [facility];  Service: Cardiovascular;  Laterality: N/A;  . TEE WITHOUT CARDIOVERSION N/A 11/25/2015   Procedure: TRANSESOPHAGEAL ECHOCARDIOGRAM (TEE);  Surgeon: Rexene Alberts, MD;  Location: [place];  Service: Open Heart Surgery;  Laterality: N/A;    Current Medications: Current Meds  Medication Sig  . acebutolol (SECTRAL) 200 MG capsule Take 1 capsule (200 mg total) by mouth every other day.  Marland Kitchen acetaminophen (TYLENOL) 500 MG tablet Take 500 mg by mouth daily as needed for pain.  Marland Kitchen amoxicillin (AMOXIL) 500 MG capsule Take 2,000 mg by mouth See admin instructions. Take 2000 mg 1 hour prior to dental work  . atorvastatin (LIPITOR) 40 MG tablet Take 40 mg by mouth daily.  . calcium carbonate (TUMS EX) 750 MG chewable tablet Chew 1,500 mg by mouth 3 (three) times daily as needed.  . carboxymethylcellulose 1 % ophthalmic solution Apply 1 drop to eye 3 (three) times daily.  . cholecalciferol (VITAMIN D) 1000 UNITS tablet Take 1,000 Units by mouth 2 (two) times daily.   . clopidogrel (PLAVIX) 75 MG tablet Take 75 mg by mouth daily.   Marland Kitchen dexamethasone (DECADRON) 2 MG tablet Take 2 mg by mouth every morning.  . famotidine (PEPCID) 10 MG tablet Take 10 mg by mouth as needed for heartburn or indigestion.  . isosorbide mononitrate (IMDUR) 30 MG 24 hr tablet TAKE 1 TABLET BY MOUTH  DAILY  . montelukast (SINGULAIR) 10 MG tablet Take 10 mg by mouth at bedtime.  . nitroGLYCERIN (NITROSTAT) 0.4 MG SL tablet Take 1 tablet 10-15 minutes before walking in the morning. (Patient taking differently: Place 0.4 mg under the tongue every 5 (five) minutes as needed for chest pain. )  . ondansetron (ZOFRAN) 4 MG tablet Take 4 mg by mouth every 8 (eight) hours as needed.  .  prochlorperazine (COMPAZINE) 10 MG tablet Take 1 tablet by mouth every 6 (six) hours as needed.  . ranolazine (RANEXA) 500 MG 12 hr tablet TAKE 1 TABLET BY MOUTH  TWICE DAILY  . sodium bicarbonate 650 MG tablet PLEASE SEE ATTACHED FOR DETAILED DIRECTIONS  . sodium chloride (OCEAN) 0.65 % nasal spray Place 1 spray into the nose every 2 (two) hours as needed.  . temozolomide (TEMODAR) 140 MG capsule Take by mouth.     Allergies:   Sulfa antibiotics and Sulfacetamide sodium   Social History   Socioeconomic History  . Marital status: Widowed    Spouse name: Not on file  . Number  of children: 2  . Years of education: Not on file  . Highest education level: Not on file  Occupational History  . Occupation: Retired  Tobacco Use  . Smoking status: Never Smoker  . Smokeless tobacco: Never Used  Substance and Sexual Activity  . Alcohol use: Yes    Alcohol/week: 1.0 standard drinks    Types: 1 Glasses of wine per week    Comment: 1 glass of wine 3 - 4 times per week   . Drug use: No  . Sexual activity: Not on file  Other Topics Concern  . Not on file  Social History Narrative   Lives at home alone   Right-handed   Caffeine: 2 mugs of decaf coffee per day, green tea   Social Determinants of Health   Financial Resource Strain:   . Difficulty of Paying Living Expenses:   Food Insecurity:   . Worried About Charity fundraiser in the Last Year:   . Arboriculturist in the Last Year:   Transportation Needs:   . Film/video editor (Medical):   Marland Kitchen Lack of Transportation (Non-Medical):   Physical Activity:   . Days of Exercise per Week:   . Minutes of Exercise per Session:   Stress:   . Feeling of Stress :   Social Connections:   . Frequency of Communication with Friends and Family:   . Frequency of Social Gatherings with Friends and Family:   . Attends Religious Services:   . Active Member of Clubs or Organizations:   . Attends Archivist Meetings:   Marland Kitchen Marital Status:       Family History: The patient's family history includes Heart failure in his mother; Lung cancer in his brother; Stroke in his sister. ROS:   Please see the history of present illness.    All other systems reviewed and are negative.  EKGs/Labs/Other Studies Reviewed:    The following studies were reviewed today:   Recent Labs: 05/02/2019: B Natriuretic Peptide 142.4 10/21/2019: ALT 15; BUN 19; Creatinine, Ser 1.06; Hemoglobin 12.9; Platelets 184; Potassium 4.4; Sodium 134; TSH 1.270  Recent Lipid Panel    Component Value Date/Time   CHOL 131 10/21/2019 1434   TRIG 47 10/21/2019 1434   HDL 75 10/21/2019 1434   CHOLHDL 1.7 10/21/2019 1434   CHOLHDL 2.1 11/20/2015 0030   VLDL 7 11/20/2015 0030   LDLCALC 45 10/21/2019 1434    Physical Exam:    VS:  BP 104/60 (BP Location: Right Arm, Patient Position: Sitting, Cuff Size: Normal)   Pulse 79   Temp 97.9 F (36.6 C)   Ht 5\' 10"  (1.778 m)   Wt 159 lb 12.8 oz (72.5 kg)   SpO2 97%   BMI 22.93 kg/m     Wt Readings from Last 3 Encounters:  04/21/20 159 lb 12.8 oz (72.5 kg)  10/21/19 154 lb 3.2 oz (69.9 kg)  09/19/19 154 lb 3.2 oz (69.9 kg)     GEN:  Well nourished, well developed in no acute distress HEENT: Normal NECK: No JVD; No carotid bruits LYMPHATICS: No lymphadenopathy CARDIAC: RRR, no murmurs, rubs, gallops RESPIRATORY:  Clear to auscultation without rales, wheezing or rhonchi  ABDOMEN: Soft, non-tender, non-distended MUSCULOSKELETAL:  No edema; No deformity  SKIN: Warm and dry NEUROLOGIC:  Alert and oriented x 3 PSYCHIATRIC:  Normal affect    Signed, Shirlee More, MD  04/21/2020 10:07 AM    [place]

## 2020-04-20 NOTE — Telephone Encounter (Signed)
Patient informed that his son may accompany to his appointment

## 2020-04-20 NOTE — Telephone Encounter (Signed)
Left message for patient to return call.

## 2020-04-21 ENCOUNTER — Other Ambulatory Visit: Payer: Self-pay

## 2020-04-21 ENCOUNTER — Encounter: Payer: Self-pay | Admitting: Cardiology

## 2020-04-21 ENCOUNTER — Ambulatory Visit: Payer: Medicare Other | Admitting: Cardiology

## 2020-04-21 VITALS — BP 104/60 | HR 79 | Temp 97.9°F | Ht 70.0 in | Wt 159.8 lb

## 2020-04-21 DIAGNOSIS — I25118 Atherosclerotic heart disease of native coronary artery with other forms of angina pectoris: Secondary | ICD-10-CM

## 2020-04-21 DIAGNOSIS — Z9889 Other specified postprocedural states: Secondary | ICD-10-CM

## 2020-04-21 DIAGNOSIS — Z951 Presence of aortocoronary bypass graft: Secondary | ICD-10-CM | POA: Diagnosis not present

## 2020-04-21 DIAGNOSIS — E782 Mixed hyperlipidemia: Secondary | ICD-10-CM | POA: Diagnosis not present

## 2020-04-21 DIAGNOSIS — R600 Localized edema: Secondary | ICD-10-CM

## 2020-04-21 NOTE — Patient Instructions (Signed)
Medication Instructions:  Your physician recommends that you continue on your current medications as directed. Please refer to the Current Medication list given to you today.  *If you need a refill on your cardiac medications before your next appointment, please call your pharmacy*   Lab Work: None If you have labs (blood work) drawn today and your tests are completely normal, you will receive your results only by: Marland Kitchen MyChart Message (if you have MyChart) OR . A paper copy in the mail If you have any lab test that is abnormal or we need to change your treatment, we will call you to review the results.   Testing/Procedures: None   Follow-Up: At [facility], you and your health needs are our priority.  As part of our continuing mission to provide you with exceptional heart care, we have created designated Provider Care Teams.  These Care Teams include your primary Cardiologist (physician) and Advanced Practice Providers (APPs -  Physician Assistants and Nurse Practitioners) who all work together to provide you with the care you need, when you need it.  We recommend signing up for the patient portal called "MyChart".  Sign up information is provided on this After Visit Summary.  MyChart is used to connect with patients for Virtual Visits (Telemedicine).  Patients are able to view lab/test results, encounter notes, upcoming appointments, etc.  Non-urgent messages can be sent to your provider as well.   To learn more about what you can do with MyChart, go to [email or web address].    Your next appointment:   6 month(s)  The format for your next appointment:   In Person  Provider:   Shirlee More, MD   Other Instructions Please wear some medium knee high support hose.

## 2020-04-28 DIAGNOSIS — D7589 Other specified diseases of blood and blood-forming organs: Secondary | ICD-10-CM

## 2020-04-28 HISTORY — DX: Other specified diseases of blood and blood-forming organs: D75.89

## 2020-05-28 DIAGNOSIS — K5909 Other constipation: Secondary | ICD-10-CM

## 2020-05-28 DIAGNOSIS — K219 Gastro-esophageal reflux disease without esophagitis: Secondary | ICD-10-CM | POA: Insufficient documentation

## 2020-05-28 HISTORY — DX: Other constipation: K59.09

## 2020-05-28 HISTORY — DX: Gastro-esophageal reflux disease without esophagitis: K21.9

## 2020-05-31 ENCOUNTER — Telehealth: Payer: Self-pay | Admitting: Cardiology

## 2020-05-31 NOTE — Telephone Encounter (Signed)
Last office visit notes faxed/mailed at this time.

## 2020-05-31 NOTE — Telephone Encounter (Signed)
New Message:   Pt have an appt with Dr Sandi Mariscal and they would like the records of pt's last office visit in April Would you please mail these record asap please. Their fax machine is broke,

## 2020-08-18 DIAGNOSIS — R519 Headache, unspecified: Secondary | ICD-10-CM | POA: Insufficient documentation

## 2020-08-18 HISTORY — DX: Headache, unspecified: R51.9

## 2020-08-20 ENCOUNTER — Other Ambulatory Visit: Payer: Self-pay | Admitting: Cardiology

## 2020-10-06 ENCOUNTER — Other Ambulatory Visit: Payer: Self-pay

## 2020-10-06 DIAGNOSIS — I341 Nonrheumatic mitral (valve) prolapse: Secondary | ICD-10-CM | POA: Insufficient documentation

## 2020-10-06 DIAGNOSIS — C4490 Unspecified malignant neoplasm of skin, unspecified: Secondary | ICD-10-CM | POA: Insufficient documentation

## 2020-10-06 DIAGNOSIS — E785 Hyperlipidemia, unspecified: Secondary | ICD-10-CM | POA: Insufficient documentation

## 2020-10-06 DIAGNOSIS — I639 Cerebral infarction, unspecified: Secondary | ICD-10-CM | POA: Insufficient documentation

## 2020-10-06 DIAGNOSIS — I2510 Atherosclerotic heart disease of native coronary artery without angina pectoris: Secondary | ICD-10-CM | POA: Insufficient documentation

## 2020-10-07 ENCOUNTER — Ambulatory Visit: Payer: Medicare Other | Admitting: Cardiology

## 2020-10-07 NOTE — Progress Notes (Signed)
Cardiology Office Note:    Date:  10/08/2020   ID:  Jack James, DOB 1934-10-21, MRN [id number]  PCP:  Derinda Late, MD  Cardiologist:  Shirlee More, MD    Referring MD: Derinda Late, MD    ASSESSMENT:    1. Coronary artery disease of native artery of native heart with stable angina pectoris ([place])   2. S/P CABG x 3   3. S/P mitral valve repair + CABG x3   4. Mixed hyperlipidemia    PLAN:    In order of problems listed above:  1. From a cardiology perspective he continues to do well and has completed his oncologic therapy without complication.  I would continue his current medical regimen including clopidogrel as an antiplatelet agent oral nitrate beta-blocker ranolazine and his high intensity statin.  New [place] Heart Association class I at this time I do not think he requires further ischemia evaluation 2. He has had a good long-term durable response to mitral valve repair no significant mitral regurgitation 3. Continue high intensity statin I requested the lipid profile done at his PCP office not able to be reviewed within epic.   Next appointment: 6 months   Medication Adjustments/Labs and Tests Ordered: Current medicines are reviewed at length with the patient today.  Concerns regarding medicines are outlined above.  No orders of the defined types were placed in this encounter.  No orders of the defined types were placed in this encounter.   Chief Complaint  Patient presents with  . Follow-up    History of Present Illness:    Jack James is a 86 y.o. male with a hx of CAD with PCI of the LAD in 2011 subsequent CABG and mitral valve repair 2016 hyperlipidemia and stroke 2010.  He was last seen 04/21/2020.  In May 2020 he underwent coronary angiography was advised ongoing medical therapy.  Most recently he has been under the care of [facility] for an ocular non-Hodgkin's lymphoma.  He has received radiation therapy for his ocular lymphoma and has had CNS  involvement.  He was treated the right toxin and has completed 4 cycles of high-dose methotrexate and he has completed his monthly therapy.  Compliance with diet, lifestyle and medications: Yes  Recent labs [facility] 09/20/2020 CMP with potassium 4.1 creatinine 1.0 GFR 69 cc normal liver function test and CBC showed a hemoglobin of 12.7.  His son said he has had lipids checked with his primary care physician I requested a copy.  He is feeling better has had no cardiovascular symptoms of edema shortness of breath chest pain palpitation or syncope and home blood pressure runs 1 83-1 20 systolic. Past Medical History:  Diagnosis Date  . Acute on chronic diastolic heart failure ([place]) 11/19/2015  . Atrial fibrillation ([place]) 01/20/2016   postoperative   . Atrial fibrillation and flutter ([place]) 01/20/2016   postoperative  . CAD (coronary artery disease), native coronary artery 08/04/2010   Cath 2011 with severe proximal LAD stenosis treated with 2.75 x 24 mm ion stent postdilated to 3.0 mm by Dr. Burt Knack  Catheterization 2016 shows 40% ostial LAD, patent stent, 50% mid LAD, 60% circumflex, and 40% RCA stenosis with severe mitral regurgitation.   . Chest pain 05/02/2019  . Chronic diastolic CHF (congestive heart failure) ([place])   . Coronary artery disease    a. 2011: DES to proximal LAD  . Coronary artery disease involving coronary bypass graft of native heart 08/04/2010   Cath 2011 with severe proximal LAD  stenosis treated with 2.75 x 24 mm ion stent postdilated to 3.0 mm by Dr. Burt Knack  Catheterization 2016 shows 40% ostial LAD, patent stent, 50% mid LAD, 60% circumflex, and 40% RCA stenosis with severe mitral regurgitation.  . Encounter for antineoplastic chemotherapy 12/28/2019  . Headache disorder 08/18/2020  . History of CVA (cerebrovascular accident) 01/19/2020  . HLD (hyperlipidemia)   . Hyperlipidemia   . Increased MCV 04/28/2020  . Mitral regurgitation 11/19/2015  . Mitral valve prolapse   . MVP (mitral  valve prolapse) 11/19/2015  . 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 Other constipation 05/28/2020  . Pleural effusion, bilateral 11/18/2015  . 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 S/P mitral valve repair 11/25/2015   Complex valvuloplasty including quadrangular resection of posterior leaflet, artificial Gore-tex neochord placement x10 and 32 mm Sorin Memo 3D Rechord ring annuloplasty  . Skin cancer   . Stroke ([place])   . Unstable angina ([place]) 11/19/2015    Past Surgical History:  Procedure Laterality Date  . CARDIAC CATHETERIZATION N/A 11/19/2015   Procedure: Left Heart Cath and Coronary Angiography;  Surgeon: Burnell Blanks, MD;  Location: [place] [facility];  Service: Cardiovascular;  Laterality: N/A;  . CATARACT EXTRACTION, BILATERAL  2017  . CLIPPING OF ATRIAL APPENDAGE  11/25/2015   Procedure: CLIPPING OF ATRIAL APPENDAGE;  Surgeon: Rexene Alberts, MD;  Location: [place];  Service: Open Heart Surgery;;  . CORONARY ARTERY BYPASS GRAFT N/A 11/25/2015   Procedure: CORONARY ARTERY BYPASS GRAFTING (CABG) TIMES THREE USING LEFT INTERNAL MAMMARY ARTERY AND RIGHT SAPHENOUS LEG VEIN HARVESTED ENDOSCOPICALLY;  Surgeon: Rexene Alberts, MD;  Location: [place];  Service: Open Heart Surgery;  Laterality: N/A;  . CORONARY STENT PLACEMENT  2011   DES to proximal LAD  . LEFT HEART CATH AND CORS/GRAFTS ANGIOGRAPHY N/A 05/02/2019   Procedure: LEFT HEART CATH AND CORS/GRAFTS ANGIOGRAPHY;  Surgeon: Belva Crome, MD;  Location: [place] CV [facility];  Service: Cardiovascular;  Laterality: N/A;  . LITHOTRIPSY    . MITRAL VALVE REPAIR N/A 11/25/2015   Procedure: MITRAL VALVE REPAIR (MVR);  Surgeon: Rexene Alberts, MD;  Location: [place];  Service: Open Heart Surgery;  Laterality: N/A;  . SKIN CANCER EXCISION    . TEE WITHOUT CARDIOVERSION N/A 11/22/2015   Procedure: TRANSESOPHAGEAL ECHOCARDIOGRAM (TEE);  Surgeon: [name], MD;  [facility];   Service: Cardiovascular;  Laterality: N/A;  . TEE WITHOUT CARDIOVERSION N/A 11/25/2015   Procedure: TRANSESOPHAGEAL ECHOCARDIOGRAM (TEE);  Surgeon: Rexene Alberts, MD;  Location: [place];  Service: Open Heart Surgery;  Laterality: N/A;    Current Medications: Current Meds  Medication Sig  . acebutolol (SECTRAL) 200 MG capsule Take 1 capsule (200 mg total) by mouth every other day.  Marland Kitchen acetaminophen (TYLENOL) 500 MG tablet Take 500 mg by mouth daily as needed for pain.  Marland Kitchen acyclovir (ZOVIRAX) 200 MG capsule Take 400 mg by mouth 2 (two) times daily.  Marland Kitchen amoxicillin (AMOXIL) 500 MG capsule Take 2,000 mg by mouth See admin instructions. Take 2000 mg 1 hour prior to dental work  . atorvastatin (LIPITOR) 20 MG tablet Take 20 mg by mouth daily.  . calcium carbonate (TUMS EX) 750 MG chewable tablet Chew 1,500 mg by mouth 3 (three) times daily as needed.  . Carboxymeth-Glyc-Polysorb PF (REFRESH OPTIVE MEGA-3) 0.5-1-0.5 % SOLN Place 1 drop into the right eye 6 (six) times  daily.  . carboxymethylcellulose 1 % ophthalmic solution Apply 1 drop to eye 3 (three) times daily.  . cholecalciferol (VITAMIN D) 1000 UNITS tablet Take 1,000 Units by mouth 2 (two) times daily.   . clopidogrel (PLAVIX) 75 MG tablet Take 75 mg by mouth daily.   . Cyanocobalamin 5000 MCG SUBL Place 5,000 mcg under the tongue daily.  Marland Kitchen dexamethasone (DECADRON) 2 MG tablet Take 2 mg by mouth every morning.  . famotidine (PEPCID) 10 MG tablet Take 10 mg by mouth as needed for heartburn or indigestion.  . hyoscyamine (LEVSIN SL) 0.125 MG SL tablet Place 0.125 mg under the tongue every 6 (six) hours as needed for cramping.   . isosorbide mononitrate (IMDUR) 30 MG 24 hr tablet TAKE 1 TABLET BY MOUTH  DAILY  . montelukast (SINGULAIR) 10 MG tablet Take 10 mg by mouth at bedtime.  . nitroGLYCERIN (NITROSTAT) 0.4 MG SL tablet Take 1 tablet 10-15 minutes before walking in the morning.  . ondansetron (ZOFRAN) 4 MG tablet Take 4 mg by mouth every 8  (eight) hours as needed.  . ondansetron (ZOFRAN) 8 MG tablet   . polyethylene glycol powder (GLYCOLAX/MIRALAX) 17 GM/SCOOP powder Take 17 g by mouth as needed.  . prochlorperazine (COMPAZINE) 10 MG tablet Take 1 tablet by mouth every 6 (six) hours as needed.  . ranolazine (RANEXA) 500 MG 12 hr tablet TAKE 1 TABLET BY MOUTH  TWICE DAILY  . SENEXON-S 8.6-50 MG tablet Take 2 tablets by mouth 2 (two) times daily as needed.  . senna-docusate (SENOKOT-S) 8.6-50 MG tablet Take by mouth.  . sodium bicarbonate 650 MG tablet PLEASE SEE ATTACHED FOR DETAILED DIRECTIONS  . sodium chloride (OCEAN) 0.65 % nasal spray Place 1 spray into the nose every 2 (two) hours as needed.  . temozolomide (TEMODAR) 140 MG capsule Take by mouth.  . triamcinolone cream (KENALOG) 0.1 % Apply topically.     Allergies:   Sulfa antibiotics and Sulfacetamide sodium   Social History   Socioeconomic History  . Marital status: Widowed    Spouse name: Not on file  . Number of children: 2  . Years of education: Not on file  . Highest education level: Not on file  Occupational History  . Occupation: Retired  Tobacco Use  . Smoking status: Never Smoker  . Smokeless tobacco: Never Used  Vaping Use  . Vaping Use: Never used  Substance and Sexual Activity  . Alcohol use: Yes    Alcohol/week: 1.0 standard drink    Types: 1 Glasses of wine per week    Comment: 1 glass of wine 3 - 4 times per week   . Drug use: No  . Sexual activity: Not on file  Other Topics Concern  . Not on file  Social History Narrative   Lives at home alone   Right-handed   Caffeine: 2 mugs of decaf coffee per day, green tea   Social Determinants of Health   Financial Resource Strain:   . Difficulty of Paying Living Expenses: Not on file  Food Insecurity:   . Worried About Charity fundraiser in the Last Year: Not on file  . Ran Out of Food in the Last Year: Not on file  Transportation Needs:   . Lack of Transportation (Medical): Not on file   . Lack of Transportation (Non-Medical): Not on file  Physical Activity:   . Days of Exercise per Week: Not on file  . Minutes of Exercise per Session: Not on file  Stress:   . Feeling of Stress : Not on file  Social Connections:   . Frequency of Communication with Friends and Family: Not on file  . Frequency of Social Gatherings with Friends and Family: Not on file  . Attends Religious Services: Not on file  . Active Member of Clubs or Organizations: Not on file  . Attends Archivist Meetings: Not on file  . Marital Status: Not on file     Family History: The patient's family history includes Heart failure in his mother; Lung cancer in his brother; Stroke in his sister. ROS:   Please see the history of present illness.    All other systems reviewed and are negative.  EKGs/Labs/Other Studies Reviewed:    The following studies were reviewed today:  EKG:  EKG ordered today and personally reviewed.  The ekg ordered today demonstrates sinus rhythm first-degree AV block otherwise normal  Recent Labs: 10/21/2019: ALT 15; BUN 19; Creatinine, Ser 1.06; Hemoglobin 12.9; Platelets 184; Potassium 4.4; Sodium 134; TSH 1.270  Recent Lipid Panel    Component Value Date/Time   CHOL 131 10/21/2019 1434   TRIG 47 10/21/2019 1434   HDL 75 10/21/2019 1434   CHOLHDL 1.7 10/21/2019 1434   CHOLHDL 2.1 11/20/2015 0030   VLDL 7 11/20/2015 0030   LDLCALC 45 10/21/2019 1434    Physical Exam:    VS:  BP (!) 140/48 (BP Location: Right Arm, Patient Position: Sitting, Cuff Size: Normal)   Pulse 68   Ht 5\' 10"  (1.778 m)   Wt 158 lb (71.7 kg)   SpO2 95%   BMI 22.67 kg/m     Wt Readings from Last 3 Encounters:  10/08/20 158 lb (71.7 kg)  04/21/20 159 lb 12.8 oz (72.5 kg)  10/21/19 154 lb 3.2 oz (69.9 kg)     GEN:  Well nourished, well developed in no acute distress HEENT: Normal NECK: No JVD; No carotid bruits LYMPHATICS: No lymphadenopathy CARDIAC: RRR, no murmurs, rubs,  gallops RESPIRATORY:  Clear to auscultation without rales, wheezing or rhonchi  ABDOMEN: Soft, non-tender, non-distended MUSCULOSKELETAL:  No edema; No deformity  SKIN: Warm and dry NEUROLOGIC:  Alert and oriented x 3 PSYCHIATRIC:  Normal affect    Signed, Shirlee More, MD  10/08/2020 8:43 AM    [facility]

## 2020-10-08 ENCOUNTER — Encounter: Payer: Self-pay | Admitting: Cardiology

## 2020-10-08 ENCOUNTER — Other Ambulatory Visit: Payer: Self-pay

## 2020-10-08 ENCOUNTER — Ambulatory Visit (INDEPENDENT_AMBULATORY_CARE_PROVIDER_SITE_OTHER): Payer: Medicare Other | Admitting: Cardiology

## 2020-10-08 VITALS — BP 140/48 | HR 68 | Ht 70.0 in | Wt 158.0 lb

## 2020-10-08 DIAGNOSIS — Z9889 Other specified postprocedural states: Secondary | ICD-10-CM

## 2020-10-08 DIAGNOSIS — E782 Mixed hyperlipidemia: Secondary | ICD-10-CM | POA: Diagnosis not present

## 2020-10-08 DIAGNOSIS — I25118 Atherosclerotic heart disease of native coronary artery with other forms of angina pectoris: Secondary | ICD-10-CM

## 2020-10-08 DIAGNOSIS — Z951 Presence of aortocoronary bypass graft: Secondary | ICD-10-CM | POA: Diagnosis not present

## 2020-10-08 NOTE — Patient Instructions (Signed)

## 2021-01-06 ENCOUNTER — Other Ambulatory Visit: Payer: Self-pay | Admitting: Cardiology

## 2021-02-01 ENCOUNTER — Other Ambulatory Visit: Payer: Self-pay | Admitting: Cardiology

## 2021-03-31 ENCOUNTER — Other Ambulatory Visit: Payer: Self-pay | Admitting: Cardiology

## 2021-04-12 NOTE — Progress Notes (Signed)
Cardiology Office Note:    Date:  04/13/2021   ID:  Jack James, DOB 10-02-1934, MRN [id number]  PCP:  Derinda Late, MD  Cardiologist:  Shirlee More, MD    Referring MD: Derinda Late, MD    ASSESSMENT:    1. Coronary artery disease of native artery of native heart with stable angina pectoris ([place])   2. S/P CABG x 3   3. S/P mitral valve repair + CABG x3   4. Mixed hyperlipidemia    PLAN:    In order of problems listed above:  1. From cardiac perspective doing well asymptomatic from his CAD on good medical therapy including clopidogrel oral nitrate ranolazine and lipid-lowering.  He is at a good long-term durable result and has no significant mitral regurgitation 2. Continue statin he is due for labs with his PCP in June wellness exam 3. We will check EKG in the office and give him a copy that he can hand carry to his wellness examination   Next appointment: 6 months his request   Medication Adjustments/Labs and Tests Ordered: Current medicines are reviewed at length with the patient today.  Concerns regarding medicines are outlined above.  No orders of the defined types were placed in this encounter.  No orders of the defined types were placed in this encounter.   Chief Complaint  Patient presents with  . Follow-up  . Coronary Artery Disease    History of Present Illness:    Jack James is a 87 y.o. male with a hx of CAD with PCI of the LAD in 2011 subsequent CABG and mitral valve repair in 2016 hyperlipidemia and stroke last seen 10/08/2020.  In May 2020 he underwent coronary angiography was advised ongoing medical therapy. Most recently he has been under the care of [facility] for an ocular non-Hodgkin's lymphoma.  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  He  has completed 4 cycles of high-dose methotrexate and he has completed his monthly therapy   Compliance with diet, lifestyle and medications: Yes  He is  slowly and steadily improving his real problem with his balance and suspected to result of his lymphoma and treatments.  Although he is hesitant I strongly encouraged him to use a cane. In terms of CAD he has done well and has had no angina continue his current medical regimen including ranolazine clopidogrel oral nitrate and lipid-lowering.  He has a wellness exam coming up in June with his PCP for labs including a lipid profile. Past Medical History:  Diagnosis Date  . Acute on chronic diastolic heart failure ([place]) 11/19/2015  . Atrial fibrillation ([place]) 01/20/2016   postoperative   . Atrial fibrillation and flutter ([place]) 01/20/2016   postoperative  . CAD (coronary artery disease), native coronary artery 08/04/2010   Cath 2011 with severe proximal LAD stenosis treated with 2.75 x 24 mm ion stent postdilated to 3.0 mm by Dr. Burt Knack  Catheterization 2016 shows 40% ostial LAD, patent stent, 50% mid LAD, 60% circumflex, and 40% RCA stenosis with severe mitral regurgitation.   . Chest pain 05/02/2019  . Chronic diastolic CHF (congestive heart failure) ([place])   . Coronary artery disease    a. 2011: DES to proximal LAD  . Coronary artery disease involving coronary bypass graft of native heart 08/04/2010   Cath 2011 with severe proximal LAD stenosis treated with 2.75 x 24 mm ion stent postdilated to 3.0 mm by Dr. Burt Knack  Catheterization 2016 shows  40% ostial LAD, patent stent, 50% mid LAD, 60% circumflex, and 40% RCA stenosis with severe mitral regurgitation.  . Encounter for antineoplastic chemotherapy 12/28/2019  . Headache disorder 08/18/2020  . History of CVA (cerebrovascular accident) 01/19/2020  . HLD (hyperlipidemia)   . Hyperlipidemia   . Increased MCV 04/28/2020  . Mitral regurgitation 11/19/2015  . Mitral valve prolapse   . MVP (mitral valve prolapse) 11/19/2015  . 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 Other constipation 05/28/2020  . Pleural effusion, bilateral 11/18/2015  . S/P CABG x 3 11/25/2015    LIMA to LAD, SVG to RCA, SVG to OM2, EVH via right thigh   . S/P mitral valve repair 11/25/2015   Complex valvuloplasty including quadrangular resection of posterior leaflet, artificial Gore-tex neochord placement x10 and 32 mm Sorin Memo 3D Rechord ring annuloplasty  . Skin cancer   . Stroke ([place])   . Unstable angina ([place]) 11/19/2015    Past Surgical History:  Procedure Laterality Date  . CARDIAC CATHETERIZATION N/A 11/19/2015   Procedure: Left Heart Cath and Coronary Angiography;  Surgeon: Burnell Blanks, MD;  Location: [place] CV LAB;  Service: Cardiovascular;  Laterality: N/A;  . CATARACT EXTRACTION, BILATERAL  2017  . CLIPPING OF ATRIAL APPENDAGE  11/25/2015   Procedure: CLIPPING OF ATRIAL APPENDAGE;  Surgeon: Rexene Alberts, MD;  Location: [place];  Service: Open Heart Surgery;;  . CORONARY ARTERY BYPASS GRAFT N/A 11/25/2015   Procedure: CORONARY ARTERY BYPASS GRAFTING (CABG) TIMES THREE USING LEFT INTERNAL MAMMARY ARTERY AND RIGHT SAPHENOUS LEG VEIN HARVESTED ENDOSCOPICALLY;  Surgeon: Rexene Alberts, MD;  Location: [place];  Service: Open Heart Surgery;  Laterality: N/A;  . CORONARY STENT PLACEMENT  2011   DES to proximal LAD  . LEFT HEART CATH AND CORS/GRAFTS ANGIOGRAPHY N/A 05/02/2019   Procedure: LEFT HEART CATH AND CORS/GRAFTS ANGIOGRAPHY;  Surgeon: Belva Crome, MD;  Location: [place] CV LAB;  Service: Cardiovascular;  Laterality: N/A;  . LITHOTRIPSY    . MITRAL VALVE REPAIR N/A 11/25/2015   Procedure: MITRAL VALVE REPAIR (MVR);  Surgeon: Rexene Alberts, MD;  Location: [place];  Service: Open Heart Surgery;  Laterality: N/A;  . SKIN CANCER EXCISION    . TEE WITHOUT CARDIOVERSION N/A 11/22/2015   Procedure: TRANSESOPHAGEAL ECHOCARDIOGRAM (TEE);  Surgeon: [name], MD;  Location: [facility] ENDOSCOPY;  Service: Cardiovascular;  Laterality: N/A;  . TEE WITHOUT CARDIOVERSION N/A 11/25/2015   Procedure: TRANSESOPHAGEAL ECHOCARDIOGRAM (TEE);  Surgeon: Rexene Alberts, MD;   Location: [place];  Service: Open Heart Surgery;  Laterality: N/A;    Current Medications: Current Meds  Medication Sig  . acebutolol (SECTRAL) 200 MG capsule TAKE 1 CAPSULE BY MOUTH  EVERY OTHER DAY  . acetaminophen (TYLENOL) 500 MG tablet Take 500 mg by mouth daily as needed for pain.  Marland Kitchen amoxicillin (AMOXIL) 500 MG capsule Take 2,000 mg by mouth See admin instructions. Take 2000 mg 1 hour prior to dental work  . atorvastatin (LIPITOR) 20 MG tablet Take 20 mg by mouth daily.  . calcium carbonate (TUMS EX) 750 MG chewable tablet Chew 1,500 mg by mouth 3 (three) times daily as needed for indigestion.  . cholecalciferol (VITAMIN D) 1000 UNITS tablet Take 1,000 Units by mouth 2 (two) times daily.   . clopidogrel (PLAVIX) 75 MG tablet Take 75 mg by mouth daily.   . famotidine (PEPCID) 10 MG tablet Take 10 mg by mouth as needed for heartburn or indigestion.  . isosorbide mononitrate (IMDUR) 30  MG 24 hr tablet TAKE 1 TABLET BY MOUTH  DAILY  . montelukast (SINGULAIR) 10 MG tablet Take 10 mg by mouth at bedtime.  . nitroGLYCERIN (NITROSTAT) 0.4 MG SL tablet Take 1 tablet 10-15 minutes before walking in the morning.  . polyethylene glycol powder (GLYCOLAX/MIRALAX) 17 GM/SCOOP powder Take 17 g by mouth as needed for constipation.  . ranolazine (RANEXA) 500 MG 12 hr tablet TAKE 1 TABLET BY MOUTH  TWICE DAILY  . SENEXON-S 8.6-50 MG tablet Take 2 tablets by mouth 2 (two) times daily as needed for constipation.     Allergies:   Sulfa antibiotics and Sulfacetamide sodium   Social History   Socioeconomic History  . Marital status: Widowed    Spouse name: Not on file  . Number of children: 2  . Years of education: Not on file  . Highest education level: Not on file  Occupational History  . Occupation: Retired  Tobacco Use  . Smoking status: Never Smoker  . Smokeless tobacco: Never Used  Vaping Use  . Vaping Use: Never used  Substance and Sexual Activity  . Alcohol use: Yes    Alcohol/week: 1.0  standard drink    Types: 1 Glasses of wine per week    Comment: 1 glass of wine 3 - 4 times per week   . Drug use: No  . Sexual activity: Not on file  Other Topics Concern  . Not on file  Social History Narrative   Lives at home alone   Right-handed   Caffeine: 2 mugs of decaf coffee per day, green tea   Social Determinants of Health   Financial Resource Strain: Not on file  Food Insecurity: Not on file  Transportation Needs: Not on file  Physical Activity: Not on file  Stress: Not on file  Social Connections: Not on file     Family History: The patient's family history includes Heart failure in his mother; Lung cancer in his brother; Stroke in his sister. ROS:   Please see the history of present illness.    All other systems reviewed and are negative.  EKGs/Labs/Other Studies Reviewed:    The following studies were reviewed today:   Recent Labs: No results found for requested labs within last 8760 hours.  Recent Lipid Panel    Component Value Date/Time   CHOL 131 10/21/2019 1434   TRIG 47 10/21/2019 1434   HDL 75 10/21/2019 1434   CHOLHDL 1.7 10/21/2019 1434   CHOLHDL 2.1 11/20/2015 0030   VLDL 7 11/20/2015 0030   LDLCALC 45 10/21/2019 1434    Physical Exam:    VS:  BP 120/62 (BP Location: Right Arm, Patient Position: Sitting)   Pulse 66   Ht 5\' 10"  (1.778 m)   Wt 159 lb (72.1 kg)   SpO2 99%   BMI 22.81 kg/m     Wt Readings from Last 3 Encounters:  04/13/21 159 lb (72.1 kg)  10/08/20 158 lb (71.7 kg)  04/21/20 159 lb 12.8 oz (72.5 kg)     GEN:  Well nourished, well developed in no acute distress HEENT: Normal NECK: No JVD; No carotid bruits LYMPHATICS: No lymphadenopathy CARDIAC: RRR, no murmurs, rubs, gallops RESPIRATORY:  Clear to auscultation without rales, wheezing or rhonchi  ABDOMEN: Soft, non-tender, non-distended MUSCULOSKELETAL:  No edema; No deformity  SKIN: Warm and dry NEUROLOGIC:  Alert and oriented x 3 PSYCHIATRIC:  Normal affect     Signed, Shirlee More, MD  04/13/2021 9:44 AM    [facility]

## 2021-04-13 ENCOUNTER — Encounter: Payer: Self-pay | Admitting: Cardiology

## 2021-04-13 ENCOUNTER — Ambulatory Visit: Payer: Medicare Other | Admitting: Cardiology

## 2021-04-13 ENCOUNTER — Other Ambulatory Visit: Payer: Self-pay

## 2021-04-13 VITALS — BP 120/62 | HR 66 | Ht 70.0 in | Wt 159.0 lb

## 2021-04-13 DIAGNOSIS — Z951 Presence of aortocoronary bypass graft: Secondary | ICD-10-CM

## 2021-04-13 DIAGNOSIS — E782 Mixed hyperlipidemia: Secondary | ICD-10-CM | POA: Diagnosis not present

## 2021-04-13 DIAGNOSIS — I25118 Atherosclerotic heart disease of native coronary artery with other forms of angina pectoris: Secondary | ICD-10-CM

## 2021-04-13 DIAGNOSIS — Z9889 Other specified postprocedural states: Secondary | ICD-10-CM | POA: Diagnosis not present

## 2021-04-13 NOTE — Patient Instructions (Signed)

## 2021-08-03 ENCOUNTER — Telehealth: Payer: Self-pay

## 2021-08-03 NOTE — Telephone Encounter (Signed)
   Primary Cardiologist: Shirlee More, MD  Chart reviewed as part of pre-operative protocol coverage. Given past medical history and time since last visit, based on ACC/AHA guidelines, WOFFORD BILLOCK would be at acceptable risk for the planned procedure without further cardiovascular testing.   His Plavix may be held for 5 days prior to his procedure.  Please resume as soon as hemostasis is achieved.  I will route this recommendation to the requesting party via Epic fax function and remove from pre-op pool.  Please call with questions.  [name]. Lorra Freeman NP-C    08/03/2021, 3:19 PM [address] Office [phone] Fax [phone]

## 2021-08-03 NOTE — Telephone Encounter (Signed)
   [facility] Pre-operative Risk Assessment    Patient Name: Jack James  DOB: 11-29-1934 MRN: [id number]  HEARTCARE STAFF:  - IMPORTANT!!!!!! Under Visit Info/Reason for Call, type in Other and utilize the format Clearance MM/DD/YY or Clearance TBD. Do not use dashes or single digits. - Please review there is not already an duplicate clearance open for this procedure. - If request is for dental extraction, please clarify the # of teeth to be extracted. - If the patient is currently at the dentist's office, call Pre-Op Callback Staff (MA/nurse) to input urgent request.  - If the patient is not currently in the dentist office, please route to the Pre-Op pool.  Request for surgical clearance:  What type of surgery is being performed? Colonoscopy/Endoscopy  When is this surgery scheduled? TBD  What type of clearance is required (medical clearance vs. Pharmacy clearance to hold med vs. Both)? Both  Are there any medications that need to be held prior to surgery and how long? Plavix length of time unspecified.   Practice name and name of physician performing surgery? [facility] Dr. Watt Climes.   What is the office phone number? [phone]   7.   What is the office fax number? [phone]  8.   Anesthesia type (None, local, MAC, general) ? Unspecified.    Gita Kudo 08/03/2021, 1:46 PM  _________________________________________________________________   (provider comments below)

## 2021-08-03 NOTE — Telephone Encounter (Signed)
Jack James. [name] 87 year old male is requesting preoperative cardiac evaluation for colonoscopy/endoscopy.  He was last seen in the clinic on 04/13/2021.  He continues to do well at that time and denied anginal symptoms.  His medication regimen was continued.  His major complaint involved his balance which appeared to be related to his lymphoma and treatments.  His PMH includes coronary artery disease with PCI of his LAD in 2011 and subsequent CABG with mitral valve repair in 2016, hyperlipidemia, and CVA.  He underwent repeat angiography 5/20, ongoing medical management was recommended.  He has been treated for his non-Hodgkin's lymphoma at [facility].  May his Plavix be held prior to his procedure?  Thank you for your help.  Please direct your response to CV DIV preop pool.  [name]. Jack Delman NP-C    08/03/2021, 2:16 PM [address] Office [phone] Fax [phone]

## 2021-09-27 ENCOUNTER — Other Ambulatory Visit: Payer: Self-pay | Admitting: Cardiology

## 2021-09-27 NOTE — Telephone Encounter (Signed)
Isosorbide Mononitrate ER 30 mg # 90 x 3 refills sent to  [facility] ([facility]) - [place], [place]

## 2021-10-18 NOTE — Progress Notes (Signed)
Cardiology Office Note:    Date:  10/19/2021   ID:  Jack James, DOB April 01, 1934, MRN [id number]  PCP:  Derinda Late, MD  Cardiologist:  Shirlee More, MD    Referring MD: Derinda Late, MD    ASSESSMENT:    1. Coronary artery disease of native artery of native heart with stable angina pectoris ([place])   2. S/P mitral valve repair + CABG x3   3. 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  He is having no angina after revascularization current medical therapy including beta-blocker antiplatelet with clopidogrel oral nitrate and a statin.  He had wanted to stop cardiac medications and I told him in his case I think it would be a mistake and he agrees to continue current medical therapy Stable good response no clinical mitral regurgitation Lipids at target continue statin labs requested from PCP not the epic system Stable followed in the [facility] program   Next appointment: 6 months   Medication Adjustments/Labs and Tests Ordered: Current medicines are reviewed at length with the patient today.  Concerns regarding medicines are outlined above.  Orders Placed This Encounter  Procedures   EKG 12-Lead   No orders of the defined types were placed in this encounter.   Chief Complaint  Patient presents with   Follow-up    History of Present Illness:    Jack James is a 87 y.o. male with a hx of CAD with PCI of the LAD in 2011 subsequent CABG and mitral valve repair in 2016 hyperlipidemia and stroke last seen 04/13/2021.  In May 2020 he underwent coronary angiography was advised ongoing medical therapy. Most recently he has been under the care of [facility] for an ocular non-Hodgkin's lymphoma.  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  He  has completed 4 cycles of high-dose methotrexate and he has completed his monthly  therapy.  Compliance with diet, lifestyle and medications: Yes His son is present participates in evaluation decision making History of present participates in evaluation decision making He continues to do well he goes to the gym and exercises no angina dyspnea palpitation or syncope. He tolerates his statin without muscle pain or weakness labs are following his PCP office not in epic and I requested a copy Inquires about withdrawing cardiac medications I told him my opinion I think it it would be a mistake  Recent labs 09/12/2021: CMP sodium 136 potassium 4.1 creatinine hemoglobin 12.5 platelets 285,000 Past Medical History:  Diagnosis Date   Acute on chronic diastolic heart failure ([place]) 11/19/2015   Atrial fibrillation ([place]) 01/20/2016   postoperative    Atrial fibrillation and flutter ([place]) 01/20/2016   postoperative   CAD (coronary artery disease), native coronary artery 08/04/2010   Cath 2011 with severe proximal LAD stenosis treated with 2.75 x 24 mm ion stent postdilated to 3.0 mm by Dr. Burt Knack  Catheterization 2016 shows 40% ostial LAD, patent stent, 50% mid LAD, 60% circumflex, and 40% RCA stenosis with severe mitral regurgitation.    Chest pain 05/02/2019   Chronic diastolic CHF (congestive heart failure) ([place])    Coronary artery disease    a. 2011: DES to proximal LAD   Coronary artery disease involving coronary bypass graft of native heart 08/04/2010   Cath 2011 with severe proximal LAD stenosis treated with 2.75 x  24 mm ion stent postdilated to 3.0 mm by Dr. Burt Knack  Catheterization 2016 shows 40% ostial LAD, patent stent, 50% mid LAD, 60% circumflex, and 40% RCA stenosis with severe mitral regurgitation.   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[place]) 11/06/2019   Other constipation 05/28/2020   Pleural effusion, bilateral 11/18/2015   S/P CABG x 3 11/25/2015   LIMA to LAD, SVG to RCA, SVG to OM2, EVH via right thigh    S/P mitral valve repair 11/25/2015   Complex valvuloplasty including quadrangular resection of posterior leaflet, artificial Gore-tex neochord placement x10 and 32 mm Sorin Memo 3D Rechord ring annuloplasty   Skin cancer    Stroke ([place])    Unstable angina ([place]) 11/19/2015    Past Surgical History:  Procedure Laterality Date   CARDIAC CATHETERIZATION N/A 11/19/2015   Procedure: Left Heart Cath and Coronary Angiography;  Surgeon: Burnell Blanks, MD;  Location: [place] [facility];  Service: Cardiovascular;  Laterality: N/A;   CATARACT EXTRACTION, BILATERAL  2017   CLIPPING OF ATRIAL APPENDAGE  11/25/2015   Procedure: CLIPPING OF ATRIAL APPENDAGE;  Surgeon: Rexene Alberts, MD;  Location: [place];  Service: Open Heart Surgery;;   CORONARY ARTERY BYPASS GRAFT N/A 11/25/2015   Procedure: CORONARY ARTERY BYPASS GRAFTING (CABG) TIMES THREE USING LEFT INTERNAL MAMMARY ARTERY AND RIGHT SAPHENOUS LEG VEIN HARVESTED ENDOSCOPICALLY;  Surgeon: Rexene Alberts, MD;  Location: [place];  Service: Open Heart Surgery;  Laterality: N/A;   CORONARY STENT PLACEMENT  2011   DES to proximal LAD   LEFT HEART CATH AND CORS/GRAFTS ANGIOGRAPHY N/A 05/02/2019   Procedure: LEFT HEART CATH AND CORS/GRAFTS ANGIOGRAPHY;  Surgeon: Belva Crome, MD;  Location: [place] [facility];  Service: Cardiovascular;  Laterality: N/A;   LITHOTRIPSY     MITRAL VALVE REPAIR N/A 11/25/2015   Procedure: MITRAL VALVE REPAIR (MVR);  Surgeon: Rexene Alberts, MD;  Location: [place];  Service: Open Heart Surgery;  Laterality: N/A;   SKIN CANCER EXCISION     TEE WITHOUT CARDIOVERSION N/A 11/22/2015   Procedure: TRANSESOPHAGEAL ECHOCARDIOGRAM (TEE);  Surgeon: [name], MD;  Location: [facility];  Service:  Cardiovascular;  Laterality: N/A;   TEE WITHOUT CARDIOVERSION N/A 11/25/2015   Procedure: TRANSESOPHAGEAL ECHOCARDIOGRAM (TEE);  Surgeon: Rexene Alberts, MD;  Location: [place];  Service: Open Heart Surgery;  Laterality: N/A;    Current Medications: Current Meds  Medication Sig   acebutolol (SECTRAL) 200 MG capsule TAKE 1 CAPSULE BY MOUTH  EVERY OTHER DAY (Patient taking differently: Take 200 mg by mouth every other day.)   acetaminophen (TYLENOL) 500 MG tablet Take 500 mg by mouth daily as needed for pain.   amoxicillin (AMOXIL) 500 MG capsule Take 2,000 mg by mouth See admin instructions. Take 2000 mg 1 hour prior to dental work   atorvastatin (LIPITOR) 20 MG tablet Take 20 mg by mouth daily.   calcium carbonate (TUMS EX) 750 MG chewable tablet Chew 1,500 mg by mouth 3 (three) times daily as needed for indigestion.   cholecalciferol (VITAMIN D) 1000 UNITS tablet Take 1,000 Units by mouth 2 (two) times daily.    clopidogrel (PLAVIX) 75 MG tablet Take 75 mg by mouth  daily.    Cyanocobalamin (B-12 PO) Place 1 tablet under the tongue daily.   famotidine (PEPCID) 10 MG tablet Take 10 mg by mouth as needed for heartburn or indigestion.   isosorbide mononitrate (IMDUR) 30 MG 24 hr tablet TAKE 1 TABLET BY MOUTH  DAILY (Patient taking differently: Take 30 mg by mouth daily.)   nitroGLYCERIN (NITROSTAT) 0.4 MG SL tablet Take 1 tablet 10-15 minutes before walking in the morning. (Patient taking differently: Place 0.4 mg under the tongue every 5 (five) minutes as needed for chest pain.)   polyethylene glycol powder (GLYCOLAX/MIRALAX) 17 GM/SCOOP powder Take 17 g by mouth as needed for constipation.   ranolazine (RANEXA) 500 MG 12 hr tablet TAKE 1 TABLET BY MOUTH  TWICE DAILY (Patient taking differently: Take 500 mg by mouth 2 (two) times daily.)   SENEXON-S 8.6-50 MG tablet Take 2 tablets by mouth 2 (two) times daily as needed for constipation.   triamcinolone cream (KENALOG) 0.1 % Apply 1 application  topically in the morning and at bedtime.     Allergies:   Sulfa antibiotics and Sulfacetamide sodium   Social History   Socioeconomic History   Marital status: Widowed    Spouse name: Not on file   Number of children: 2   Years of education: Not on file   Highest education level: Not on file  Occupational History   Occupation: Retired  Tobacco Use   Smoking status: Never   Smokeless tobacco: Never  Vaping Use   Vaping Use: Never used  Substance and Sexual Activity   Alcohol use: Yes    Alcohol/week: 1.0 standard drink    Types: 1 Glasses of wine per week    Comment: 1 glass of wine 3 - 4 times per week    Drug use: No   Sexual activity: Not on file  Other Topics Concern   Not on file  Social History Narrative   Lives at home alone   Right-handed   Caffeine: 2 mugs of decaf coffee per day, green tea   Social Determinants of Health   Financial Resource Strain: Not on file  Food Insecurity: Not on file  Transportation Needs: Not on file  Physical Activity: Not on file  Stress: Not on file  Social Connections: Not on file     Family History: The patient's family history includes Heart failure in his mother; Lung cancer in his brother; Stroke in his sister. ROS:   Please see the history of present illness.    All other systems reviewed and are negative.  EKGs/Labs/Other Studies Reviewed:    The following studies were reviewed today:  EKG:  EKG ordered today and personally reviewed.  The ekg ordered today demonstrates sinus rhythm and is normal  Recent Labs: No results found for requested labs within last 8760 hours.  Recent Lipid Panel    Component Value Date/Time   CHOL 131 10/21/2019 1434   TRIG 47 10/21/2019 1434   HDL 75 10/21/2019 1434   CHOLHDL 1.7 10/21/2019 1434   CHOLHDL 2.1 11/20/2015 0030   VLDL 7 11/20/2015 0030   LDLCALC 45 10/21/2019 1434    Physical Exam:    VS:  BP 128/72 (BP Location: Right Arm, Patient Position: Sitting)   Pulse 60    Ht 5\' 11"  (1.803 m)   Wt 158 lb 12.8 oz (72 kg)   SpO2 96%   BMI 22.15 kg/m     Wt Readings from Last 3 Encounters:  10/19/21 158 lb 12.8  oz (72 kg)  04/13/21 159 lb (72.1 kg)  10/08/20 158 lb (71.7 kg)     GEN: Appears his age well nourished, well developed in no acute distress HEENT: Normal NECK: No JVD; No carotid bruits LYMPHATICS: No lymphadenopathy CARDIAC: RRR, no murmurs, rubs, gallops RESPIRATORY:  Clear to auscultation without rales, wheezing or rhonchi  ABDOMEN: Soft, non-tender, non-distended MUSCULOSKELETAL:  No edema; No deformity  SKIN: Warm and dry NEUROLOGIC:  Alert and oriented x 3 PSYCHIATRIC:  Normal affect    Signed, Shirlee More, MD  10/19/2021 9:43 AM    [place]

## 2021-10-19 ENCOUNTER — Encounter: Payer: Self-pay | Admitting: Cardiology

## 2021-10-19 ENCOUNTER — Other Ambulatory Visit: Payer: Self-pay

## 2021-10-19 ENCOUNTER — Ambulatory Visit: Payer: Medicare Other | Admitting: Cardiology

## 2021-10-19 VITALS — BP 128/72 | HR 60 | Ht 71.0 in | Wt 158.8 lb

## 2021-10-19 DIAGNOSIS — C8599 Non-Hodgkin lymphoma, unspecified, extranodal and solid organ sites: Secondary | ICD-10-CM

## 2021-10-19 DIAGNOSIS — Z9889 Other specified postprocedural states: Secondary | ICD-10-CM | POA: Diagnosis not present

## 2021-10-19 DIAGNOSIS — E782 Mixed hyperlipidemia: Secondary | ICD-10-CM

## 2021-10-19 DIAGNOSIS — I25118 Atherosclerotic heart disease of native coronary artery with other forms of angina pectoris: Secondary | ICD-10-CM | POA: Diagnosis not present

## 2021-10-19 NOTE — Patient Instructions (Signed)

## 2021-12-13 ENCOUNTER — Encounter: Payer: Self-pay | Admitting: Cardiology

## 2021-12-13 MED ORDER — RANOLAZINE ER 500 MG PO TB12
500.0000 mg | ORAL_TABLET | Freq: Two times a day (BID) | ORAL | 0 refills | Status: DC
Start: 2021-12-13 — End: 2022-04-21

## 2021-12-13 NOTE — Addendum Note (Signed)
Addended by: Darrel Reach on: 12/13/2021 03:30 PM   Modules accepted: Orders

## 2021-12-27 ENCOUNTER — Other Ambulatory Visit: Payer: Self-pay | Admitting: Cardiology

## 2022-04-19 ENCOUNTER — Encounter: Payer: Self-pay | Admitting: Cardiology

## 2022-04-19 ENCOUNTER — Ambulatory Visit: Payer: Medicare Other | Admitting: Cardiology

## 2022-04-19 VITALS — BP 132/60 | HR 55 | Ht 70.0 in | Wt 158.8 lb

## 2022-04-19 DIAGNOSIS — K5730 Diverticulosis of large intestine without perforation or abscess without bleeding: Secondary | ICD-10-CM | POA: Insufficient documentation

## 2022-04-19 DIAGNOSIS — Z9889 Other specified postprocedural states: Secondary | ICD-10-CM | POA: Diagnosis not present

## 2022-04-19 DIAGNOSIS — Z860100 Personal history of colon polyps, unspecified: Secondary | ICD-10-CM | POA: Insufficient documentation

## 2022-04-19 DIAGNOSIS — C8599 Non-Hodgkin lymphoma, unspecified, extranodal and solid organ sites: Secondary | ICD-10-CM | POA: Diagnosis not present

## 2022-04-19 DIAGNOSIS — E782 Mixed hyperlipidemia: Secondary | ICD-10-CM | POA: Diagnosis not present

## 2022-04-19 DIAGNOSIS — I25118 Atherosclerotic heart disease of native coronary artery with other forms of angina pectoris: Secondary | ICD-10-CM

## 2022-04-19 HISTORY — DX: Diverticulosis of large intestine without perforation or abscess without bleeding: K57.30

## 2022-04-19 HISTORY — DX: Personal history of colon polyps, unspecified: Z86.0100

## 2022-04-19 NOTE — Progress Notes (Signed)
?Cardiology Office Note:   ? ?Date:  04/19/2022  ? ?ID:  Jack James, DOB 1934/01/24, MRN [id number] ? ?PCP:  Derinda Late, MD  ?Cardiologist:  Shirlee More, MD   ? ?Referring MD: Derinda Late, MD  ? ? ?ASSESSMENT:   ? ?1. Coronary artery disease of native artery of native heart with stable angina pectoris ([place])   ?2. S/P mitral valve repair + CABG x3   ?3. 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 ?PLAN:   ? ?In order of problems listed above: ? ?From my perspective he is doing well with CAD he is having no anginal discomfort continue treatment including low-dose beta-blocker clopidogrel oral nitrate and he will withdrawal his ranolazine.  Presently is taking lipid-lowering treatment ?Good result for mitral valve surgery ?Follow-up labs with his PCP July goal LDL less than 70 ?Managed at [facility] ?Problems with posture gait balance and I think he do best with physical therapy modalities not uncommon after chemotherapy ? ? ?Next appointment: 6 months ? ? ?Medication Adjustments/Labs and Tests Ordered: ?Current medicines are reviewed at length with the patient today.  Concerns regarding medicines are outlined above.  ?No orders of the defined types were placed in this encounter. ? ?No orders of the defined types were placed in this encounter. ? ? ?Chief Complaint  ?Patient presents with  ? Follow-up  ? Coronary Artery Disease  ? ? ?History of Present Illness:   ? ?Jack James is a 88 y.o. male with a hx of CAD with PCI of the LAD in 2011 subsequent CABG and mitral valve repair in 2016 hyperlipidemia and stroke last seen 04/13/2021.  In May 2020 he underwent coronary angiography was advised ongoing medical therapy. Most recently he has been under the care of [facility] for an ocular non-Hodgkin's lymphoma.  He has received radiation therapy and chemotherapy for his ocular lymphoma and has had CNS involvement and continues to follow at [facility].Marland Kitchen  He was last seen 10/19/2021. ? ?Compliance with diet,  lifestyle and medications: Yes ? ?He is here with his son ?From a cardiology perspective doing well he exercises he has no chest pain edema shortness of breath palpitation or syncope ?His concern is he has trouble with balance he has had chemotherapy ocular migraine and he has trouble with fatigue and is concerned is related to cardiac medications. ?In the end he decided to take himself off Ranexa as a trial to see if it improves his balance ?I told him I think it is somewhere between conceivable and possible and may help him to let us know the response I told him and his son I think he is best to contact his PCP and inquire about physical therapy with worse perceived trouble with gait balance and holding his head up. ?He is due for labs in July with his primary care physician for lipids ?He tolerates his statin without muscle pain or weakness ? ?Recent labs performed at [facility] 01/02/2022 ?CMP results are good serum sodium minimally reduced 133 potassium 4.2 creatinine 1.0 GFR 74 cc ?Hemoglobin 12.9 hematocrit 38.6 platelets 210,000 ?Past Medical History:  ?Diagnosis Date  ? Acute on chronic diastolic heart failure ([place]) 11/19/2015  ? Atrial fibrillation ([place]) 01/20/2016  ? postoperative   ? Atrial fibrillation and flutter ([place]) 01/20/2016  ? postoperative  ? CAD (coronary artery disease), native coronary artery 08/04/2010  ? Cath 2011 with severe proximal LAD stenosis treated with 2.75 x 24 mm ion stent postdilated to 3.0 mm  by Dr. Burt Knack  Catheterization 2016 shows 40% ostial LAD, patent stent, 50% mid LAD, 60% circumflex, and 40% RCA stenosis with severe mitral regurgitation.   ? Chest pain 05/02/2019  ? Chronic diastolic CHF (congestive heart failure) ([place])   ? Coronary artery disease   ? a. 2011: DES to proximal LAD  ? Coronary artery disease involving coronary bypass graft of native heart 08/04/2010  ? Cath 2011 with severe proximal LAD stenosis treated with 2.75 x 24 mm ion stent postdilated to 3.0 mm by Dr. Burt Knack   Catheterization 2016 shows 40% ostial LAD, patent stent, 50% mid LAD, 60% circumflex, and 40% RCA stenosis with severe mitral regurgitation.  ? Encounter for antineoplastic chemotherapy 12/28/2019  ? Headache disorder 08/18/2020  ? History of CVA (cerebrovascular accident) 01/19/2020  ? HLD (hyperlipidemia)   ? Hyperlipidemia   ? Increased MCV 04/28/2020  ? Mitral regurgitation 11/19/2015  ? Mitral valve prolapse   ? MVP (mitral valve prolapse) 11/19/2015  ? 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 Other constipation 05/28/2020  ? Pleural effusion, bilateral 11/18/2015  ? S/P CABG x 3 11/25/2015  ? LIMA to LAD, SVG to RCA, SVG to OM2, EVH via right thigh   ? S/P mitral valve repair 11/25/2015  ? Complex valvuloplasty including quadrangular resection of posterior leaflet, artificial Gore-tex neochord placement x10 and 32 mm Sorin Memo 3D Rechord ring annuloplasty  ? Skin cancer   ? Stroke [facility])   ? Unstable angina ([place]) 11/19/2015  ? ? ?Past Surgical History:  ?Procedure Laterality Date  ? CARDIAC CATHETERIZATION N/A 11/19/2015  ? Procedure: Left Heart Cath and Coronary Angiography;  Surgeon: Burnell Blanks, MD;  Location: [place] [facility];  Service: Cardiovascular;  Laterality: N/A;  ? CATARACT EXTRACTION, BILATERAL  2017  ? CLIPPING OF ATRIAL APPENDAGE  11/25/2015  ? Procedure: CLIPPING OF ATRIAL APPENDAGE;  Surgeon: Rexene Alberts, MD;  Location: [place];  Service: Open Heart Surgery;;  ? CORONARY ARTERY BYPASS GRAFT N/A 11/25/2015  ? Procedure: CORONARY ARTERY BYPASS GRAFTING (CABG) TIMES THREE USING LEFT INTERNAL MAMMARY ARTERY AND RIGHT SAPHENOUS LEG VEIN HARVESTED ENDOSCOPICALLY;  Surgeon: Rexene Alberts, MD;  Location: [place];  Service: Open Heart Surgery;  Laterality: N/A;  ? CORONARY STENT PLACEMENT  2011  ? DES to proximal LAD  ? LEFT HEART CATH AND CORS/GRAFTS ANGIOGRAPHY N/A 05/02/2019  ? Procedure: LEFT HEART CATH AND CORS/GRAFTS ANGIOGRAPHY;  Surgeon: Belva Crome, MD;  Location: [place] [facility];  Service:  Cardiovascular;  Laterality: N/A;  ? LITHOTRIPSY    ? MITRAL VALVE REPAIR N/A 11/25/2015  ? Procedure: MITRAL VALVE REPAIR (MVR);  Surgeon: Rexene Alberts, MD;  Location: [place];  Service: Open Heart Surgery;  Laterality: N/A;  ? SKIN CANCER EXCISION    ? TEE WITHOUT CARDIOVERSION N/A 11/22/2015  ? Procedure: TRANSESOPHAGEAL ECHOCARDIOGRAM (TEE);  Surgeon: [name], MD;  Location: [place];  Service: Cardiovascular;  Laterality: N/A;  ? TEE WITHOUT CARDIOVERSION N/A 11/25/2015  ? Procedure: TRANSESOPHAGEAL ECHOCARDIOGRAM (TEE);  Surgeon: Rexene Alberts, MD;  Location: [place];  Service: Open Heart Surgery;  Laterality: N/A;  ? ? ?Current Medications: ?Current Meds  ?Medication Sig  ? acebutolol (SECTRAL) 200 MG capsule Take 1 capsule (200 mg total) by mouth every other day.  ? acetaminophen (TYLENOL) 500 MG tablet Take 500 mg by mouth daily as needed for pain.  ? amoxicillin (AMOXIL) 500 MG capsule Take 2,000 mg by mouth See admin instructions. Take 2000 mg  1 hour prior to dental work  ? atorvastatin (LIPITOR) 20 MG tablet Take 20 mg by mouth daily.  ? calcium carbonate (TUMS EX) 750 MG chewable tablet Chew 1,500 mg by mouth 3 (three) times daily as needed for indigestion.  ? cholecalciferol (VITAMIN D) 1000 UNITS tablet Take 1,000 Units by mouth 2 (two) times daily.   ? clopidogrel (PLAVIX) 75 MG tablet Take 75 mg by mouth daily.   ? Cyanocobalamin (B-12 PO) Place 1 tablet under the tongue daily.  ? famotidine (PEPCID) 10 MG tablet Take 10 mg by mouth as needed for heartburn or indigestion.  ? isosorbide mononitrate (IMDUR) 30 MG 24 hr tablet TAKE 1 TABLET BY MOUTH  DAILY (Patient taking differently: Take 30 mg by mouth daily.)  ? nitroGLYCERIN (NITROSTAT) 0.4 MG SL tablet Take 1 tablet 10-15 minutes before walking in the morning. (Patient taking differently: Place 0.4 mg under the tongue every 5 (five) minutes as needed for chest pain.)  ? polyethylene glycol powder (GLYCOLAX/MIRALAX) 17 GM/SCOOP powder  Take 17 g by mouth as needed for constipation.  ? ranolazine (RANEXA) 500 MG 12 hr tablet Take 1 tablet (500 mg total) by mouth 2 (two) times daily.  ? SENEXON-S 8.6-50 MG tablet Take 2 tablets by mouth 2 (two)

## 2022-04-19 NOTE — Patient Instructions (Signed)
Medication Instructions:  ?STOP RANEXA- 2 weeks as a trial for balance ? ? ?Lab Work: ?None Ordered ?If you have labs (blood work) drawn today and your tests are completely normal, you will receive your results only by: ?MyChart Message (if you have MyChart) OR ?A paper copy in the mail ?If you have any lab test that is abnormal or we need to change your treatment, we will call you to review the results. ? ? ?Testing/Procedures: ?None Ordered ? ? ?Follow-Up: ?At [facility], you and your health needs are our priority.  As part of our continuing mission to provide you with exceptional heart care, we have created designated Provider Care Teams.  These Care Teams include your primary Cardiologist (physician) and Advanced Practice Providers (APPs -  Physician Assistants and Nurse Practitioners) who all work together to provide you with the care you need, when you need it. ? ?We recommend signing up for the patient portal called "MyChart".  Sign up information is provided on this After Visit Summary.  MyChart is used to connect with patients for Virtual Visits (Telemedicine).  Patients are able to view lab/test results, encounter notes, upcoming appointments, etc.  Non-urgent messages can be sent to your provider as well.   ?To learn more about what you can do with MyChart, go to [email or web address].   ? ?Your next appointment:   ?6 month(s) ? ?The format for your next appointment:   ?In Person ? ?Provider:   ?Jenne Campus, MD  ? ? ?Other Instructions ?NA  ?

## 2022-04-21 ENCOUNTER — Other Ambulatory Visit: Payer: Self-pay | Admitting: Cardiology

## 2022-04-26 ENCOUNTER — Encounter: Payer: Self-pay | Admitting: Cardiology

## 2022-06-02 ENCOUNTER — Encounter: Payer: Self-pay | Admitting: Cardiology

## 2022-08-28 ENCOUNTER — Other Ambulatory Visit: Payer: Self-pay | Admitting: Cardiology

## 2022-10-05 ENCOUNTER — Telehealth: Payer: Self-pay

## 2022-10-05 ENCOUNTER — Other Ambulatory Visit: Payer: Self-pay

## 2022-10-05 NOTE — Telephone Encounter (Signed)
Received the following message from Dr. Bettina Gavia regarding this patient:  "Message in the folder that he was having hypotension stop acebutolol"  Acebutolol was discontinued and attempted to call the patient to inform him. The patient did not answer the phone. Left message for the patient to call back.

## 2022-10-05 NOTE — Telephone Encounter (Signed)
Pt is returning call. Requesting call back.  

## 2022-10-06 ENCOUNTER — Telehealth: Payer: Self-pay | Admitting: Cardiology

## 2022-10-06 ENCOUNTER — Telehealth: Payer: Self-pay

## 2022-10-06 NOTE — Telephone Encounter (Signed)
Patient returned RN's call. 

## 2022-10-06 NOTE — Telephone Encounter (Signed)
Follow Up:     Patient called back. He said he did not need a call back. He said everything was good. He said to be sure and tell Delfino Lovett this also please.

## 2022-10-06 NOTE — Telephone Encounter (Signed)
Called pt to see what needs are.  Reports Richard, RN left a voice mail at 2:30 requesting a call back to the office.  Was told earlier today to stop acebutolol.  Wants to know if Delfino Lovett had anything further to add.   Will route to Delfino Lovett, RN to follow up.

## 2022-10-06 NOTE — Telephone Encounter (Signed)
Following Up:    Patient sys he is waiting for a return call from yesterday. He says he would like to get a call before 1:00 today please.

## 2022-10-06 NOTE — Telephone Encounter (Signed)
Patient is aware to stop acebutolol.

## 2022-10-06 NOTE — Telephone Encounter (Signed)
Called patient is aware to stop the acebutolol.

## 2022-10-25 ENCOUNTER — Ambulatory Visit: Payer: Medicare Other | Attending: Cardiology | Admitting: Cardiology

## 2022-10-25 ENCOUNTER — Ambulatory Visit: Payer: Medicare Other | Admitting: Cardiology

## 2022-10-25 ENCOUNTER — Encounter: Payer: Self-pay | Admitting: Cardiology

## 2022-10-25 VITALS — BP 127/72 | HR 80 | Ht 70.0 in | Wt 157.0 lb

## 2022-10-25 DIAGNOSIS — E782 Mixed hyperlipidemia: Secondary | ICD-10-CM

## 2022-10-25 DIAGNOSIS — Z9889 Other specified postprocedural states: Secondary | ICD-10-CM | POA: Diagnosis not present

## 2022-10-25 DIAGNOSIS — I25118 Atherosclerotic heart disease of native coronary artery with other forms of angina pectoris: Secondary | ICD-10-CM

## 2022-10-25 NOTE — Patient Instructions (Signed)
Medication Instructions:  Your physician recommends that you continue on your current medications as directed. Please refer to the Current Medication list given to you today.  *If you need a refill on your cardiac medications before your next appointment, please call your pharmacy*   Lab Work: None Ordered If you have labs (blood work) drawn today and your tests are completely normal, you will receive your results only by: [place] (if you have MyChart) OR A paper copy in the mail If you have any lab test that is abnormal or we need to change your treatment, we will call you to review the results.   Testing/Procedures: None Ordered   Follow-Up: At [facility], you and your health needs are our priority.  As part of our continuing mission to provide you with exceptional heart care, we have created designated Provider Care Teams.  These Care Teams include your primary Cardiologist (physician) and Advanced Practice Providers (APPs -  Physician Assistants and Nurse Practitioners) who all work together to provide you with the care you need, when you need it.  We recommend signing up for the patient portal called "MyChart".  Sign up information is provided on this After Visit Summary.  MyChart is used to connect with patients for Virtual Visits (Telemedicine).  Patients are able to view lab/test results, encounter notes, upcoming appointments, etc.  Non-urgent messages can be sent to your provider as well.   To learn more about what you can do with MyChart, go to [email or web address].    Your next appointment:   9 month(s)  The format for your next appointment:   In Person  Provider:   Jenne Campus, MD    Other Instructions NA

## 2022-10-25 NOTE — Progress Notes (Signed)
Cardiology Office Note:    Date:  10/25/2022   ID:  Elmore Guise, DOB 08/30/1934, MRN [id number]  PCP:  Derinda Late, MD  Cardiologist:  Shirlee More, MD    Referring MD: Derinda Late, MD    ASSESSMENT:    1. Coronary artery disease of native artery of native heart with stable angina pectoris ([place])   2. S/P mitral valve repair + CABG x3   3. Mixed hyperlipidemia    PLAN:    In order of problems listed above:  Continues to do well CAD off beta-blocker having no angina we will continue his current medical therapy including single antiplatelet clopidogrel his high intensity statin labs followed in his PCP office Imdur and ranolazine.  I encouraged him to be active Good result long-term from his mitral valve surgery consider repeat echocardiogram around the time his next visit Continue his high intensity statin labs followed in his PCP office   Next appointment: 9 months   Medication Adjustments/Labs and Tests Ordered: Current medicines are reviewed at length with the patient today.  Concerns regarding medicines are outlined above.  No orders of the defined types were placed in this encounter.  No orders of the defined types were placed in this encounter.   Chief Complaint  Patient presents with   Follow-up   Coronary Artery Disease    History of Present Illness:    GARETH FITZNER is a 88 y.o. male with a hx of CAD with PCI of the LAD in 2011 subsequent CABG and mitral valve repair 2016 hyperlipidemia and stroke last seen 04/19/2022.  In May 2020 he underwent coronary angiography was advised ongoing medical therapy. 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  He has received radiation therapy and chemotherapy for his ocular lymphoma and has had CNS involvement and continues to follow at [facility].  Compliance with diet, lifestyle and medications: Yes  His son is present participates in  evaluation decision making With bradycardia hypotension off his beta-blocker and continues to do well with no angina edema shortness of breath chest pain palpitation or syncope His problems fatigue and gait dysfunction likely related to treatment for his ocular lymphoma On current Zoom discussed with the physicians at [place] labs performed at [facility] 07/10/2022: CMP with sodium 136 potassium 4.4 creatinine 1.0 GFR 74 cc normal liver function test hemoglobin 12.9 platelets 197,000 Past Medical History:  Diagnosis Date   Acute on chronic diastolic heart failure ([place]) 11/19/2015   Atrial fibrillation ([place]) 01/20/2016   postoperative    Atrial fibrillation and flutter ([place]) 01/20/2016   postoperative   CAD (coronary artery disease), native coronary artery 08/04/2010   Cath 2011 with severe proximal LAD stenosis treated with 2.75 x 24 mm ion stent postdilated to 3.0 mm by Dr. Burt Knack  Catheterization 2016 shows 40% ostial LAD, patent stent, 50% mid LAD, 60% circumflex, and 40% RCA stenosis with severe mitral regurgitation.    Chest pain 05/02/2019   Chronic diastolic CHF (congestive heart failure) ([place])    Coronary artery disease    a. 2011: DES to proximal LAD   Coronary artery disease involving coronary bypass graft of native heart 08/04/2010   Cath 2011 with severe proximal LAD stenosis treated with 2.75 x 24 mm ion stent postdilated to 3.0 mm by Dr. Burt Knack  Catheterization 2016 shows 40% ostial LAD, patent stent, 50% mid LAD, 60% circumflex, and 40% RCA stenosis with severe mitral  regurgitation.   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   Other constipation 05/28/2020   Pleural effusion, bilateral  11/18/2015   S/P CABG x 3 11/25/2015   LIMA to LAD, SVG to RCA, SVG to OM2, EVH via right thigh    S/P mitral valve repair 11/25/2015   Complex valvuloplasty including quadrangular resection of posterior leaflet, artificial Gore-tex neochord placement x10 and 32 mm Sorin Memo 3D Rechord ring annuloplasty   Skin cancer    Stroke ([place])    Unstable angina ([place]) 11/19/2015    Past Surgical History:  Procedure Laterality Date   CARDIAC CATHETERIZATION N/A 11/19/2015   Procedure: Left Heart Cath and Coronary Angiography;  Surgeon: Burnell Blanks, MD;  Location: [place] CV LAB;  Service: Cardiovascular;  Laterality: N/A;   CATARACT EXTRACTION, BILATERAL  2017   CLIPPING OF ATRIAL APPENDAGE  11/25/2015   Procedure: CLIPPING OF ATRIAL APPENDAGE;  Surgeon: Rexene Alberts, MD;  Location: [place];  Service: Open Heart Surgery;;   CORONARY ARTERY BYPASS GRAFT N/A 11/25/2015   Procedure: CORONARY ARTERY BYPASS GRAFTING (CABG) TIMES THREE USING LEFT INTERNAL MAMMARY ARTERY AND RIGHT SAPHENOUS LEG VEIN HARVESTED ENDOSCOPICALLY;  Surgeon: Rexene Alberts, MD;  Location: [place];  Service: Open Heart Surgery;  Laterality: N/A;   CORONARY STENT PLACEMENT  2011   DES to proximal LAD   LEFT HEART CATH AND CORS/GRAFTS ANGIOGRAPHY N/A 05/02/2019   Procedure: LEFT HEART CATH AND CORS/GRAFTS ANGIOGRAPHY;  Surgeon: Belva Crome, MD;  Location: [place] CV LAB;  Service: Cardiovascular;  Laterality: N/A;   LITHOTRIPSY     MITRAL VALVE REPAIR N/A 11/25/2015   Procedure: MITRAL VALVE REPAIR (MVR);  Surgeon: Rexene Alberts, MD;  Location: [place];  Service: Open Heart Surgery;  Laterality: N/A;   SKIN CANCER EXCISION     TEE WITHOUT CARDIOVERSION N/A 11/22/2015   Procedure: TRANSESOPHAGEAL ECHOCARDIOGRAM (TEE);  Surgeon: [name], MD;  Location: [facility] ENDOSCOPY;  Service: Cardiovascular;  Laterality: N/A;   TEE WITHOUT CARDIOVERSION N/A 11/25/2015   Procedure: TRANSESOPHAGEAL ECHOCARDIOGRAM (TEE);  Surgeon:  Rexene Alberts, MD;  Location: [place];  Service: Open Heart Surgery;  Laterality: N/A;    Current Medications: Current Meds  Medication Sig   acetaminophen (TYLENOL) 500 MG tablet Take 500 mg by mouth daily as needed for pain.   amoxicillin (AMOXIL) 500 MG capsule Take 2,000 mg by mouth See admin instructions. Take 2000 mg 1 hour prior to dental work   atorvastatin (LIPITOR) 20 MG tablet Take 20 mg by mouth daily.   calcium carbonate (TUMS EX) 750 MG chewable tablet Chew 1,500 mg by mouth 3 (three) times daily as needed for indigestion.   cholecalciferol (VITAMIN D) 1000 UNITS tablet Take 1,000 Units by mouth 3 (three) times daily.   clopidogrel (PLAVIX) 75 MG tablet Take 75 mg by mouth daily.    Cyanocobalamin (B-12 PO) Place 1 tablet under the tongue daily.   famotidine (PEPCID) 10 MG tablet Take 10 mg by mouth as needed for heartburn or indigestion.   isosorbide mononitrate (IMDUR) 30 MG 24 hr tablet TAKE 1 TABLET BY MOUTH  DAILY   nitroGLYCERIN (NITROSTAT) 0.4 MG SL tablet Take 1 tablet  10-15 minutes before walking in the morning. (Patient taking differently: Place 0.4 mg under the tongue every 5 (five) minutes as needed for chest pain.)   polyethylene glycol powder (GLYCOLAX/MIRALAX) 17 GM/SCOOP powder Take 17 g by mouth as needed for constipation.   ranolazine (RANEXA) 500 MG 12 hr tablet TAKE 1 TABLET BY MOUTH  TWICE DAILY   SENEXON-S 8.6-50 MG tablet Take 2 tablets by mouth 2 (two) times daily as needed for constipation.   triamcinolone cream (KENALOG) 0.1 % Apply 1 application topically in the morning and at bedtime.     Allergies:   Sulfa antibiotics and Sulfacetamide sodium   Social History   Socioeconomic History   Marital status: Widowed    Spouse name: Not on file   Number of children: 2   Years of education: Not on file   Highest education level: Not on file  Occupational History   Occupation: Retired  Tobacco Use   Smoking status: Never   Smokeless tobacco: Never   Vaping Use   Vaping Use: Never used  Substance and Sexual Activity   Alcohol use: Yes    Alcohol/week: 1.0 standard drink of alcohol    Types: 1 Glasses of wine per week    Comment: 1 glass of wine 3 - 4 times per week    Drug use: No   Sexual activity: Not on file  Other Topics Concern   Not on file  Social History Narrative   Lives at home alone   Right-handed   Caffeine: 2 mugs of decaf coffee per day, green tea   Social Determinants of Health   Financial Resource Strain: Not on file  Food Insecurity: Not on file  Transportation Needs: Not on file  Physical Activity: Not on file  Stress: Not on file  Social Connections: Not on file     Family History: The patient's family history includes Heart failure in his mother; Lung cancer in his brother; Stroke in his sister. ROS:   Please see the history of present illness.    All other systems reviewed and are negative.  EKGs/Labs/Other Studies Reviewed:    The following studies were reviewed today:   Recent Labs: No results found for requested labs within last 365 days.  Recent Lipid Panel    Component Value Date/Time   CHOL 131 10/21/2019 1434   TRIG 47 10/21/2019 1434   HDL 75 10/21/2019 1434   CHOLHDL 1.7 10/21/2019 1434   CHOLHDL 2.1 11/20/2015 0030   VLDL 7 11/20/2015 0030   LDLCALC 45 10/21/2019 1434    Physical Exam:    VS:  BP 127/72 (BP Location: Right Arm, Patient Position: Sitting, Cuff Size: Normal)   Pulse 80   Ht '5\' 10"'$  (1.778 m)   Wt 157 lb (71.2 kg)   SpO2 97%   BMI 22.53 kg/m     Wt Readings from Last 3 Encounters:  10/25/22 157 lb (71.2 kg)  04/19/22 158 lb 12.8 oz (72 kg)  10/19/21 158 lb 12.8 oz (72 kg)     GEN:  Well nourished, well developed in no acute distress HEENT: Normal NECK: No JVD; No carotid bruits LYMPHATICS: No lymphadenopathy CARDIAC: RRR, no murmurs, rubs, gallops RESPIRATORY:  Clear to auscultation without rales, wheezing or rhonchi  ABDOMEN: Soft, non-tender,  non-distended MUSCULOSKELETAL:  No edema; No deformity  SKIN: Warm and dry NEUROLOGIC:  Alert and oriented x 3 PSYCHIATRIC:  Normal affect    Signed, Shirlee More, MD  10/25/2022 8:49 AM  [facility]

## 2023-04-15 ENCOUNTER — Other Ambulatory Visit: Payer: Self-pay | Admitting: Cardiology

## 2023-04-16 NOTE — Telephone Encounter (Signed)
Rx to pharmacy

## 2023-08-07 NOTE — Progress Notes (Unsigned)
Cardiology Office Note:    Date:  08/08/2023   ID:  Jack James, DOB [birth year], MRN [id number]  PCP:  Mosetta Putt, MD  Cardiologist:  Norman Herrlich, MD    Referring MD: Mosetta Putt, MD    ASSESSMENT:    1. Coronary artery disease of native artery of native heart with stable angina pectoris (HCC)   2. S/P mitral valve repair + CABG x3   3. Mixed hyperlipidemia    PLAN:    In order of problems listed above:  Stable CAD he has no anginal discomfort he will continue his long-term therapy including clopidogrel high intensity statin oral nitrate and minimum dose of beta-blocker.  He has a smart watch and monitors his heart rhythm at home Clinically has had a very good response to revascularization mitral valve repair but I am concerned with his edema and echocardiogram was ordered to look at pulmonary pressures valve function and ejection fraction. Purchase a new validated blood pressure device trend his blood pressures at home Continue ranolazine which is given on a good response and not having anginal discomfort Continue his statin have requested labs from his PCP   Next appointment: 6 months   Medication Adjustments/Labs and Tests Ordered: Current medicines are reviewed at length with the patient today.  Concerns regarding medicines are outlined above.  Orders Placed This Encounter  Procedures   EKG 12-Lead   ECHOCARDIOGRAM COMPLETE   No orders of the defined types were placed in this encounter.    History of Present Illness:    Jack James is a 89 y.o. male with a hx of coronary artery disease with PCI of the LAD 2011 subsequent CABG and mitral valve repair 2016 hyperlipidemia stroke and complex oncologic care at [facility] for ocular non-Hodgkin's lymphoma treated with radiation and chemotherapy last seen 10/25/2022.  Labs from his PCP office may 2023 GFR 59 cc creatinine 1.23 sodium 133 potassium 4.1 hemoglobin 12.7 there was not a lipid profile done at the  time  Compliance with diet, lifestyle and medications: Yes  This became a long visit with multiple issues many of them noncardiac He is back on a low-dose of a beta-blocker and is tolerated well He is concerned about home blood pressures he has a normal device and we decided since his blood pressure is normal now office to purchase a new validated blood pressure device check at home and bring readings with him to my office He continues trouble with balance and he is going to be seeing ENT and audiology He was adding salt to his diet and he has noticed a little bit of peripheral edema He is not having orthopnea shortness of breath chest pain palpitation or syncope Initially I would have delayed to 2026 to check an echo after mitral valve surgery with his edema and complaints of hypotension we will move that up Lipids are being followed in his PCP office I cannot see them in epic and I have requested a copy He tolerates his lipid-lowering therapy without muscle pain or weakness Past Medical History:  Diagnosis Date   Acute on chronic diastolic heart failure (HCC) 11/19/2015   Atrial fibrillation (HCC) 01/20/2016   postoperative    Atrial fibrillation and flutter (HCC) 01/20/2016   postoperative   CAD (coronary artery disease), native coronary artery 08/04/2010   Cath 2011 with severe proximal LAD stenosis treated with 2.75 x 24 mm ion stent postdilated to 3.0 mm by Dr. Excell Seltzer  Catheterization 2016 shows 40% ostial LAD,  patent stent, 50% mid LAD, 60% circumflex, and 40% RCA stenosis with severe mitral regurgitation.    Chest pain 05/02/2019   Chronic diastolic CHF (congestive heart failure) (HCC)    Coronary artery disease    a. 2011: DES to proximal LAD   Coronary artery disease involving coronary bypass graft of native heart 08/04/2010   Cath 2011 with severe proximal LAD stenosis treated with 2.75 x 24 mm ion stent postdilated to 3.0 mm by Dr. Excell Seltzer  Catheterization 2016 shows 40% ostial LAD,  patent stent, 50% mid LAD, 60% circumflex, and 40% RCA stenosis with severe mitral regurgitation.   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   acetaminophen (TYLENOL) 500 MG tablet Take 500 mg by mouth daily as needed for pain.   amoxicillin (AMOXIL) 500 MG capsule Take 2,000 mg by mouth See admin instructions. Take 2000 mg 1 hour prior to dental work   atorvastatin (LIPITOR) 20 MG tablet Take 20 mg by mouth daily.   calcium carbonate (TUMS EX) 750 MG chewable tablet Chew 1,500 mg by mouth 3 (three) times daily as needed for indigestion.   cholecalciferol (VITAMIN D) 1000 UNITS tablet Take 1,000 Units by mouth 3 (three) times daily.   clopidogrel (PLAVIX) 75 MG tablet Take 75 mg by mouth daily.    Cyanocobalamin (B-12 PO) Place 1 tablet under the tongue daily.   famotidine (PEPCID) 10 MG tablet Take 10 mg by mouth as needed for heartburn or indigestion.   isosorbide mononitrate (IMDUR) 30 MG 24 hr tablet TAKE 1 TABLET BY MOUTH  DAILY   nitroGLYCERIN (NITROSTAT) 0.4 MG SL tablet Take 1 tablet 10-15 minutes before walking in the morning.  (Patient taking differently: Place 0.4 mg under the tongue every 5 (five) minutes as needed for chest pain.)   polyethylene glycol powder (GLYCOLAX/MIRALAX) 17 GM/SCOOP powder Take 17 g by mouth as needed for constipation.   ranolazine (RANEXA) 500 MG 12 hr tablet TAKE 1 TABLET BY MOUTH TWICE  DAILY   SENEXON-S 8.6-50 MG tablet Take 2 tablets by mouth 2 (two) times daily as needed for constipation.   triamcinolone cream (KENALOG) 0.1 % Apply 1 application topically in the morning and at bedtime.      EKGs/Labs/Other Studies Reviewed:    The following studies were reviewed today:     EKG Interpretation Date/Time:  Wednesday August 08 2023 07:59:14 EDT Ventricular Rate:  59 PR Interval:  254 QRS Duration:  92 QT Interval:  426 QTC Calculation: 421 R Axis:   61  Text Interpretation: Sinus bradycardia with 1st degree A-V block Poor R wave  progression Abnormal ECG When compared with ECG of 03-May-2019 06:47, No significant change was found Confirmed by Norman Herrlich ([id number]) on 08/08/2023 8:16:25 AM    Recent Labs: No results found for requested labs within last 365 days.  Recent Lipid Panel    Component Value Date/Time   CHOL 131 10/21/2019 1434   TRIG 47 10/21/2019 1434   HDL 75 10/21/2019 1434   CHOLHDL 1.7 10/21/2019 1434   CHOLHDL 2.1 11/20/2015 0030   VLDL 7 11/20/2015 0030   LDLCALC 45 10/21/2019 1434    Physical Exam:    VS:  BP 118/60   Pulse (!) 59   Ht 5\' 11"  (1.803 m)   Wt 157 lb 12.8 oz (71.6 kg)   SpO2 97%   BMI 22.01 kg/m     Wt Readings from Last 3 Encounters:  08/08/23 157 lb 12.8 oz (71.6 kg)  10/25/22 157 lb (71.2 kg)  04/19/22 158 lb 12.8 oz (72 kg)     GEN:  Well nourished, well developed in no acute distress HEENT: Normal NECK: No JVD; No carotid bruits LYMPHATICS: No lymphadenopathy CARDIAC: RRR, no murmurs, rubs, gallops RESPIRATORY:  Clear to auscultation without rales, wheezing or rhonchi  ABDOMEN: Soft, non-tender,  non-distended MUSCULOSKELETAL: Mild 1+ pretibial edema; No deformity  SKIN: Warm and dry NEUROLOGIC:  Alert and oriented x 3 PSYCHIATRIC:  Normal affect    Signed, Norman Herrlich, MD  08/08/2023 9:28 AM    [facility]

## 2023-08-08 ENCOUNTER — Ambulatory Visit: Payer: Medicare Other | Attending: Cardiology | Admitting: Cardiology

## 2023-08-08 ENCOUNTER — Encounter: Payer: Self-pay | Admitting: Cardiology

## 2023-08-08 VITALS — BP 118/60 | HR 59 | Ht 71.0 in | Wt 157.8 lb

## 2023-08-08 DIAGNOSIS — I25118 Atherosclerotic heart disease of native coronary artery with other forms of angina pectoris: Secondary | ICD-10-CM | POA: Diagnosis not present

## 2023-08-08 DIAGNOSIS — E782 Mixed hyperlipidemia: Secondary | ICD-10-CM | POA: Diagnosis not present

## 2023-08-08 DIAGNOSIS — Z9889 Other specified postprocedural states: Secondary | ICD-10-CM | POA: Diagnosis not present

## 2023-08-08 NOTE — Patient Instructions (Signed)
Medication Instructions:  Your physician recommends that you continue on your current medications as directed. Please refer to the Current Medication list given to you today.  *If you need a refill on your cardiac medications before your next appointment, please call your pharmacy*   Lab Work: None If you have labs (blood work) drawn today and your tests are completely normal, you will receive your results only by: MyChart Message (if you have MyChart) OR A paper copy in the mail If you have any lab test that is abnormal or we need to change your treatment, we will call you to review the results.   Testing/Procedures: Your physician has requested that you have an echocardiogram. Echocardiography is a painless test that uses sound waves to create images of your heart. It provides your doctor with information about the size and shape of your heart and how well your heart's chambers and valves are working. This procedure takes approximately one hour. There are no restrictions for this procedure. Please do NOT wear cologne, perfume, aftershave, or lotions (deodorant is allowed). Please arrive 15 minutes prior to your appointment time.    Follow-Up: At [facility], you and your health needs are our priority.  As part of our continuing mission to provide you with exceptional heart care, we have created designated Provider Care Teams.  These Care Teams include your primary Cardiologist (physician) and Advanced Practice Providers (APPs -  Physician Assistants and Nurse Practitioners) who all work together to provide you with the care you need, when you need it.  We recommend signing up for the patient portal called "MyChart".  Sign up information is provided on this After Visit Summary.  MyChart is used to connect with patients for Virtual Visits (Telemedicine).  Patients are able to view lab/test results, encounter notes, upcoming appointments, etc.  Non-urgent messages can be sent to your  provider as well.   To learn more about what you can do with MyChart, go to [email or web address].    Your next appointment:   6 month(s)  Provider:   Norman Herrlich, MD    Other Instructions Purchase an [employer] blood pressure device.

## 2023-08-19 ENCOUNTER — Other Ambulatory Visit: Payer: Self-pay | Admitting: Cardiology

## 2023-09-05 LAB — LAB REPORT - SCANNED
A1c: 5.4
EGFR: 74

## 2023-09-12 ENCOUNTER — Ambulatory Visit: Payer: Medicare Other | Attending: Cardiology

## 2023-09-12 DIAGNOSIS — I25118 Atherosclerotic heart disease of native coronary artery with other forms of angina pectoris: Secondary | ICD-10-CM

## 2023-09-12 DIAGNOSIS — Z9889 Other specified postprocedural states: Secondary | ICD-10-CM

## 2023-09-12 DIAGNOSIS — E782 Mixed hyperlipidemia: Secondary | ICD-10-CM

## 2023-09-12 LAB — ECHOCARDIOGRAM COMPLETE
AR max vel: 1.68 cm2
AV Area VTI: 1.64 cm2
AV Area mean vel: 1.65 cm2
AV Mean grad: 2 mmHg
AV Peak grad: 4.5 mmHg
Ao pk vel: 1.06 m/s
Area-P 1/2: 2.12 cm2
P 1/2 time: 705 ms
S' Lateral: 3.4 cm

## 2023-09-12 LAB — LAB REPORT - SCANNED
A1c: 5.4
EGFR: 74
EGFR: 74
PSA, Total: 2.46
PSA, Total: 2.46

## 2023-09-19 DIAGNOSIS — H903 Sensorineural hearing loss, bilateral: Secondary | ICD-10-CM

## 2023-09-19 DIAGNOSIS — R2689 Other abnormalities of gait and mobility: Secondary | ICD-10-CM

## 2023-09-19 HISTORY — DX: Other abnormalities of gait and mobility: R26.89

## 2023-09-19 HISTORY — DX: Sensorineural hearing loss, bilateral: H90.3

## 2023-10-09 ENCOUNTER — Telehealth: Payer: Self-pay | Admitting: Cardiology

## 2023-10-09 NOTE — Telephone Encounter (Signed)
Most recent Echo faxed to Dr. Geoffery Lyons office per ROI. ROI scanned to chart.

## 2024-02-19 ENCOUNTER — Encounter: Payer: Self-pay | Admitting: Cardiology

## 2024-02-19 NOTE — Progress Notes (Unsigned)
 Cardiology Office Note:    Date:  02/20/2024   ID:  Jack James, DOB [birth year], MRN [id number]  PCP:  Mosetta Putt, MD  Cardiologist:  Norman Herrlich, MD    Referring MD: Mosetta Putt, MD    ASSESSMENT:    1. S/P CABG x 3   2. S/P mitral valve repair + CABG x3   3. Mixed hyperlipidemia    PLAN:    In order of problems listed above:  Doing well following bypass surgery mitral valve repair asymptomatic reassuring echocardiogram and continue his current medical regimen including a low-dose beta-blocker clopidogrel oral nitrate and lipid-lowering with a statin Ideal lipids continue atorvastatin 20 mg daily Hyponatremia which clearly has worsened I told him a #125 or less is an urgency we will recheck today if less than 130 I will direct him back to his primary care physician I asked him to enforce a 2 L/day liquid restriction in his diet.   Next appointment: 6 months   Medication Adjustments/Labs and Tests Ordered: Current medicines are reviewed at length with the patient today.  Concerns regarding medicines are outlined above.  No orders of the defined types were placed in this encounter.  No orders of the defined types were placed in this encounter.    History of Present Illness:    Jack James is a 89 y.o. 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  Following that visit he had an echocardiogram performed showing the left ventricle to have a normal ejection fraction reduced GLS normal right ventricular size function and pulmonary artery pressure.  He had a good result from his mitral valve repair in 2016.  Continues to follow with oncology at [facility] for his CNS lymphoma.  Recent labs 01/08/2024 hemoglobin 12.9 platelets 208,000 both normal CMP with significant hyponatremia sodium  127 BUN 22 creatinine 1.0.  Compliance with diet, lifestyle and medications: Yes  Their focused today that his results of his echocardiogram are reassuring good long-term durable response from his mitral valve surgery and CABG He has done well he is having no angina edema shortness of breath palpitation or syncope He tolerates his statin without muscle pain or weakness He has not worsened hyponatremia he has been adding liquid to his diet I explained that this is outside the scope of my practice but again I became the focus of the visit and I told him I would recheck electrolytes serum sodium if remains less than 130 he should have further evaluation looking to see whether this is renal a problem with urinary water excretion or central related to pituitary water balance. I reinforced with him to maintain a 2 L of liquid restriction and that adding salt to his diet would not help. Past Medical History:  Diagnosis Date   Acute on chronic diastolic heart failure (HCC) 11/19/2015   Atrial fibrillation (HCC) 01/20/2016   postoperative    Atrial fibrillation and flutter (HCC) 01/20/2016   postoperative   Bilateral sensorineural hearing loss 09/19/2023   CAD (coronary artery disease), native coronary artery 08/04/2010   Cath 2011 with severe proximal LAD stenosis treated with 2.75 x 24 mm ion stent postdilated to 3.0 mm by Dr. Excell Seltzer  Catheterization 2016 shows 40% ostial LAD, patent stent, 50% mid LAD, 60% circumflex, and 40% RCA stenosis with severe mitral regurgitation.  Chest pain 05/02/2019   Chronic diastolic CHF (congestive heart failure) (HCC)    Coronary artery disease    a. 2011: DES to proximal LAD   Coronary artery disease involving coronary bypass graft of native heart 08/04/2010   Cath 2011 with severe proximal LAD stenosis treated with 2.75 x 24 mm ion stent postdilated to 3.0 mm by Dr. Excell Seltzer  Catheterization 2016 shows 40% ostial LAD, patent stent, 50% mid LAD, 60% circumflex,  and 40% RCA stenosis with severe mitral regurgitation.   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   Other constipation 05/28/2020   Pleural effusion, bilateral 11/18/2015   S/P CABG x 3 11/25/2015   LIMA to LAD, SVG to RCA, SVG to OM2, EVH via right thigh    S/P mitral valve repair 11/25/2015   Complex valvuloplasty including quadrangular resection of posterior leaflet, artificial Gore-tex neochord placement x10 and 32 mm Sorin Memo 3D Rechord ring annuloplasty   Skin cancer    Stroke (HCC)    Unstable angina (HCC) 11/19/2015    Current Medications: Current Meds  Medication Sig   acebutolol (SECTRAL) 200 MG capsule TAKE 1 CAPSULE BY MOUTH EVERY  OTHER DAY   acetaminophen (TYLENOL) 500 MG tablet Take 500 mg by mouth daily as needed for pain.   amoxicillin (AMOXIL) 500 MG capsule Take 2,000 mg by mouth See admin instructions. Take 2000 mg 1 hour prior to dental work   atorvastatin (LIPITOR) 20 MG tablet Take 20 mg by mouth daily.   calcium carbonate (TUMS EX) 750 MG chewable tablet Chew 1,500 mg by mouth 3 (three) times daily as needed for indigestion.   cholecalciferol (VITAMIN D) 1000 UNITS tablet Take 1,000 Units by mouth 3 (three) times daily.   clopidogrel (PLAVIX) 75 MG tablet Take 75 mg by mouth daily.    Cyanocobalamin (B-12 PO) Place 1 tablet under the tongue daily.   famotidine (PEPCID) 10 MG tablet Take 10 mg by mouth as needed for heartburn or indigestion.   fluocinonide  cream (LIDEX) 0.05 % Apply 1 Application topically 2 (two) times daily.   isosorbide mononitrate (IMDUR) 30 MG 24 hr tablet TAKE 1 TABLET BY MOUTH DAILY   nitroGLYCERIN (NITROSTAT) 0.4 MG SL tablet Take 1 tablet 10-15 minutes before walking in the morning. (Patient taking differently: Place 0.4 mg under the tongue every 5 (five) minutes as needed for chest pain.)   polyethylene glycol powder (GLYCOLAX/MIRALAX) 17 GM/SCOOP powder Take 17 g by mouth as needed for constipation.   ranolazine (RANEXA) 500 MG 12 hr tablet TAKE 1 TABLET BY MOUTH TWICE  DAILY   SENEXON-S 8.6-50 MG tablet Take 2 tablets by mouth 2 (two) times daily as needed for constipation.   triamcinolone cream (KENALOG) 0.1 % Apply 1 application topically in the morning and at bedtime.      EKGs/Labs/Other Studies Reviewed:    The following studies were reviewed today:  Cardiac Studies & Procedures   ______________________________________________________________________________________________ CARDIAC CATHETERIZATION  CARDIAC CATHETERIZATION 05/02/2019  Narrative  Total  occlusion of the mid RCA.  Widely patent left main.  Patent LAD with diffuse 30% in-stent restenosis in the proximal to mid vessel.  Beyond the stent there is a region of diffuse disease in the 50 to 70% range.  The insertion of the LIMA and the distal vessel demonstrates competitive flow with an atretic appearing LIMA.  60% proximal circumflex with competitive flow due to a widely patent saphenous vein graft  Widely patent but aneurysmal saphenous vein graft to the distal right coronary.  Aneurysm proximally and in the mid vessel are at sites of valves.  No obvious thrombus is noted.  Widely patent saphenous vein graft to the second obtuse marginal.  Patent but diffusely atretic LIMA to LAD with competitive flow.  Recommendations:   Consider alternative explanations for the patient's dyspnea and chest tightness.  The chest tightness seems to be worse  with activity but is also present at rest and a milder degree.  Dyspnea on exertion seems unlikely related to left ventricular diastolic heart failure.  I do note mild mitral stenosis was noted on echo.  Perhaps at higher heart rates mitral stenosis could be contributing to dyspnea.  If angina continues to be a concern, consider Lexiscan Myoview looking for apical ischemia.  If noted, stent of the native LAD beyond the previously placed stent might be helpful, but based upon angiography today it did not seem to be needed.  Findings Coronary Findings Diagnostic  Dominance: Right  Left Anterior Descending Ost LAD to Prox LAD lesion is 30% stenosed. The lesion was previously treated. Prox LAD lesion is 70% stenosed.  Left Circumflex Vessel is large. Ost Cx to Prox Cx lesion is 60% stenosed.  First Obtuse Marginal Branch Vessel is large in size.  Second Obtuse Marginal Branch Vessel is large in size.  Right Coronary Artery Mid RCA lesion is 100% stenosed.  LIMA Graft To Mid LAD and is small. Origin to Dist Graft lesion is 70% stenosed.  Graft To 2nd Mrg  Graft To Dist RCA  Intervention  No interventions have been documented.   CARDIAC CATHETERIZATION  CARDIAC CATHETERIZATION 11/19/2015  Narrative 1. Triple vessel CAD 2. Patent stent mid LAD with minimal restenosis. 3. Moderate stenosis proximal Circumflex that does not appear to be flow limiting. 4. Moderate stenosis mid LAD beyond the old stented segment 5. Mild to Moderate stenosis mid RCA 6. 3+ Mitral regurgitation 7. Normal LV systolic function  Recommendations: His clinical presentation includes acute CHF with pleuritic chest pain and dyspnea. There are no severe flow limiting lesions noted on cath although he does have moderate CAD. He is also noted to have 3+ MR on cath. Echo is pending. At this time, would follow results of echo and continue diuresis with IV Lasix. Medical management of CAD at this time but may  need revascularization if mitral valve is repaired/replaced.  Findings Coronary Findings Diagnostic  Dominance: Right  Left Anterior Descending Calcified diffuse. Diffuse. The lesion was previously treated with a drug-eluting stent greater than two years ago. Diffuse.  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  Second Diagonal Branch The vessel is small in size.  Left Circumflex . Vessel is large. Discrete.  First Obtuse Marginal Branch The vessel is moderate in size.  Second Obtuse Marginal Branch The vessel is moderate in size.  Right Coronary Artery . Vessel is large. Calcified diffuse. Calcified diffuse.  Right Posterior Descending Artery The vessel is small in size.  Intervention  No interventions have been documented.   STRESS TESTS  MYOCARDIAL PERFUSION IMAGING 05/15/2019  Narrative  The left ventricular ejection fraction is normal (55-65%).  Nuclear stress EF: 60%.  Blood pressure demonstrated a normal response to exercise.  There was no ST segment deviation noted during stress.  This is a low risk study.  No ischemia or scan noted.  Normal EF.  Reverse redistribution involving inferior wall of unknown clinical significance.   ECHOCARDIOGRAM  ECHOCARDIOGRAM COMPLETE 09/12/2023  Narrative ECHOCARDIOGRAM REPORT    Patient Name:   Jack James Date of Exam: 09/12/2023 Medical Rec #:  [id number]       Height:       71.0 in Accession #:    [id number]      Weight:       157.8 lb Date of Birth:  December 05, 1934      BSA:          1.907 m Patient Age:    89 years        BP:           118/60 mmHg Patient Gender: M               HR:           60 bpm. Exam Location:  [place]  Procedure: 2D Echo, Cardiac Doppler, Color Doppler and Strain Analysis  Indications:    Coronary artery disease of native artery of native heart with stable angina pectoris (HCC) [I25.118 (ICD-10-CM)]; S/P mitral valve repair + CABG x3 [X32.440 (ICD-10-CM)]; Mixed hyperlipidemia  [E78.2 (ICD-10-CM)]  History:        Patient has prior history of Echocardiogram examinations, most recent 05/02/2019. CAD, Prior CABG, Mitral Valve Disease; Risk Factors:Dyslipidemia. S/P mitral valve repair.  Mitral Valve: prosthetic annuloplasty ring valve is present in the mitral position. Procedure Date: 11/25/2015.  Sonographer:    Margreta Journey RDCS Referring Phys: [id number] Roshaunda Starkey J Leinaala Catanese  IMPRESSIONS   1. Left ventricular ejection fraction, by estimation, is 55 to 60%. The left ventricle has normal function. The left ventricle has no regional wall motion abnormalities. Left ventricular diastolic parameters are consistent with Grade II diastolic dysfunction (pseudonormalization). The average left ventricular global longitudinal strain is -13.7 %. The global longitudinal strain is abnormal. 2. Right ventricular systolic function is normal. The right ventricular size is normal. There is normal pulmonary artery systolic pressure. 3. The mitral valve is normal in structure. Trivial mitral valve regurgitation. Mild mitral stenosis. The mean mitral valve gradient is 2.0 mmHg. There is a prosthetic annuloplasty ring present in the mitral position. Procedure Date: 11/25/2015. 4. The aortic valve is normal in structure. Aortic valve regurgitation is mild. No aortic stenosis is present. 5. The inferior vena cava is normal in size with greater than 50% respiratory variability, suggesting right atrial pressure of 3 mmHg.  FINDINGS Left Ventricle: Left ventricular ejection fraction, by estimation, is 55 to 60%. The left ventricle has normal function. The left ventricle has no regional wall motion abnormalities. The average left ventricular global longitudinal strain is -13.7 %. The global longitudinal strain is abnormal. The left ventricular internal cavity size was normal in size. There is no left ventricular hypertrophy. Left ventricular diastolic parameters are consistent with Grade II diastolic  dysfunction (pseudonormalization).  Right Ventricle: The right ventricular size is normal. No increase in right ventricular wall thickness. Right ventricular systolic function is normal. There is normal pulmonary artery systolic pressure. The tricuspid regurgitant velocity is 2.28 m/s, and with an assumed right atrial pressure of 3 mmHg, the estimated right ventricular systolic  pressure is 23.8 mmHg.  Left Atrium: Left atrial size was normal in size.  Right Atrium: Right atrial size was normal in size.  Pericardium: There is no evidence of pericardial effusion.  Mitral Valve: The mitral valve is normal in structure. There is moderate thickening of the mitral valve leaflet(s). Trivial mitral valve regurgitation. There is a prosthetic annuloplasty ring present in the mitral position. Procedure Date: 11/25/2015. Mild mitral valve stenosis. MV peak gradient, 5.3 mmHg. The mean mitral valve gradient is 2.0 mmHg.  Tricuspid Valve: The tricuspid valve is normal in structure. Tricuspid valve regurgitation is mild . No evidence of tricuspid stenosis.  Aortic Valve: The aortic valve is normal in structure. Aortic valve regurgitation is mild. Aortic regurgitation PHT measures 705 msec. No aortic stenosis is present. Aortic valve mean gradient measures 2.0 mmHg. Aortic valve peak gradient measures 4.5 mmHg. Aortic valve area, by VTI measures 1.64 cm.  Pulmonic Valve: The pulmonic valve was normal in structure. Pulmonic valve regurgitation is not visualized. No evidence of pulmonic stenosis.  Aorta: The aortic root is normal in size and structure.  Venous: The inferior vena cava is normal in size with greater than 50% respiratory variability, suggesting right atrial pressure of 3 mmHg.  IAS/Shunts: No atrial level shunt detected by color flow Doppler.   LEFT VENTRICLE PLAX 2D LVIDd:         4.60 cm   Diastology LVIDs:         3.40 cm   LV e' medial:    7.07 cm/s LV PW:         0.80 cm   LV E/e'  medial:  20.5 LV IVS:        0.80 cm   LV e' lateral:   8.70 cm/s LVOT diam:     1.80 cm   LV E/e' lateral: 16.7 LV SV:         43 LV SV Index:   22        2D Longitudinal Strain LVOT Area:     2.54 cm  2D Strain GLS Avg:     -13.7 %   RIGHT VENTRICLE             IVC RV Basal diam:  3.60 cm     IVC diam: 1.40 cm RV Mid diam:    3.00 cm RV S prime:     10.00 cm/s TAPSE (M-mode): 1.4 cm  LEFT ATRIUM           Index        RIGHT ATRIUM           Index LA diam:      3.40 cm 1.78 cm/m   RA Area:     17.10 cm LA Vol (A2C): 75.6 ml 39.65 ml/m  RA Volume:   44.00 ml  23.08 ml/m LA Vol (A4C): 39.7 ml 20.82 ml/m AORTIC VALVE AV Area (Vmax):    1.68 cm AV Area (Vmean):   1.65 cm AV Area (VTI):     1.64 cm AV Vmax:           106.00 cm/s AV Vmean:          72.900 cm/s AV VTI:            0.261 m AV Peak Grad:      4.5 mmHg AV Mean Grad:      2.0 mmHg LVOT Vmax:         69.85 cm/s LVOT Vmean:  47.150 cm/s LVOT VTI:          0.168 m LVOT/AV VTI ratio: 0.64 AI PHT:            705 msec  AORTA Ao Root diam: 3.00 cm Ao Asc diam:  2.20 cm Ao Desc diam: 2.20 cm  MITRAL VALVE                TRICUSPID VALVE MV Area (PHT): 2.12 cm     TR Peak grad:   20.8 mmHg MV Peak grad:  5.3 mmHg     TR Vmax:        228.00 cm/s MV Mean grad:  2.0 mmHg MV Vmax:       1.15 m/s     SHUNTS MV Vmean:      73.9 cm/s    Systemic VTI:  0.17 m MV Decel Time: 357 msec     Systemic Diam: 1.80 cm MV E velocity: 145.00 cm/s MV A velocity: 85.80 cm/s MV E/A ratio:  1.69  Gypsy Balsam MD Electronically signed by Gypsy Balsam MD Signature Date/Time: 09/12/2023/11:49:25 AM    Final   TEE  ECHO TEE 11/22/2015  Narrative *[facility]* *[facility]* [address] [place], [place] [address] [phone]  ------------------------------------------------------------------- Transesophageal Echocardiography  Patient:    Jack James, Jack James MR #:       [id number] Study  Date: 11/22/2015 Gender:     M Age:        21 Height:     177.8 cm Weight:     72.3 kg BSA:        1.89 m^2 Pt. Status: Room:       3W20C  Layla Maw, M.D. REFERRING    Charlton Haws, M.D. ADMITTING    Tonny Bollman, MD PERFORMING   Zoila Shutter MD ATTENDING    Alvira Monday SONOGRAPHER  Leta Jungling, RDCS  cc:  ------------------------------------------------------------------- LV EF: 60% -   65%  ------------------------------------------------------------------- Indications:      Mitral regurgitation 424.0.  ------------------------------------------------------------------- History:   PMH:  Mitral Valve Prolapse. EKG Change.  Coronary artery disease.  PMH:  Chronic Anemia.  Risk factors: Dyslipidemia.  ------------------------------------------------------------------- Study Conclusions  - Left ventricle: The cavity size was normal. There was mild concentric hypertrophy. Systolic function was normal. The estimated ejection fraction was in the range of 60% to 65%. Wall motion was normal; there were no regional wall motion abnormalities. - Aortic valve: No evidence of vegetation. - Mitral valve: Myxomatous degeneration of the valve with thickening of the AMVL and mild prolapse. There is a flail posterior chord and P2 (pan leaflet prolapse) with severe centro-posteriorly directed mitral regurgitation. - Left atrium: The atrium was dilated. No evidence of thrombus in the atrial cavity or appendage. - Right atrium: The atrium was dilated. - Atrial septum: No defect or patent foramen ovale was identified. - Pulmonic valve: No evidence of vegetation.  Impressions:  - Myxomatous degeneration of the mitral valve with AMVL thickening and a flail PMVL chord with pan-leaflet prolpase and severe centro-posteriorly directed regurgitation.  Diagnostic transesophageal echocardiography.  2D and color Doppler. Birthdate:  Patient birthdate: October 31, 1934.   Age:  Patient is 89 yr old.  Sex:  Gender: male.    BMI: 22.9 kg/m^2.  Blood pressure: 121/62  Patient status:  Inpatient.  Study date:  Study date: 11/22/2015. Study time: 10:48 AM.  Location:  Endoscopy.  -------------------------------------------------------------------  ------------------------------------------------------------------- Left ventricle:  The cavity size was normal.  There was mild concentric hypertrophy. Systolic function was normal. The estimated ejection fraction was in the range of 60% to 65%. Wall motion was normal; there were no regional wall motion abnormalities.  ------------------------------------------------------------------- Aortic valve:   Structurally normal valve. Trileaflet. Cusp separation was normal.  No evidence of vegetation.  Doppler:  There was no regurgitation.  ------------------------------------------------------------------- Aorta:  The aorta was normal, not dilated, and non-diseased.  ------------------------------------------------------------------- Mitral valve:  Myxomatous degeneration of the valve with thickening of the AMVL and mild prolapse. There is a flail posterior chord and P2 (pan leaflet prolapse) with severe centro-posteriorly directed mitral regurgitation.  ------------------------------------------------------------------- Left atrium:  The atrium was dilated.  No evidence of thrombus in the atrial cavity or appendage.  ------------------------------------------------------------------- Atrial septum:  No defect or patent foramen ovale was identified.  ------------------------------------------------------------------- Right ventricle:  The cavity size was normal. Wall thickness was normal. Systolic function was normal.  ------------------------------------------------------------------- Pulmonic valve:    Structurally normal valve.   Cusp separation was normal.  No evidence of  vegetation.  ------------------------------------------------------------------- Tricuspid valve:   Doppler:  There was mild regurgitation.  ------------------------------------------------------------------- Right atrium:  The atrium was dilated.  ------------------------------------------------------------------- Pericardium:  There was no pericardial effusion.  ------------------------------------------------------------------- Post procedure conclusions Ascending Aorta:  - The aorta was normal, not dilated, and non-diseased.  ------------------------------------------------------------------- Measurements  Mitral valve                    Value Mitral regurg VTI, PISA         130   cm Mitral ERO, PISA                0.88  cm^2 Mitral regurg volume, PISA      114   ml  Legend: (L)  and  (H)  mark values outside specified reference range.  ------------------------------------------------------------------- Prepared and Electronically Authenticated by  Zoila Shutter MD 2016-11-28T17:33:44  MONITORS  LONG TERM MONITOR (3-14 DAYS) 01/08/2019  Narrative A ZIO monitor was performed for 14 days beginning 01/08/2019.  Study was performed to evaluate arrhythmia in the setting of stroke.  The rhythm throughout is sinus minimum average and maximum heart rates of 4971 and 117 bpm.  There was one triggered event associated with sinus rhythm there were no symptomatic events.  Ventricular ectopy is rare with isolated PVCs couplet and triplet.  Supraventricular ectopy is rare there are no episodes of atrial fibrillation or flutter.  There are no bradycardic events of pauses greater than 3 seconds or high-grade AV or sinus block.   Conclusion unremarkable 14-day extended monitor with no atrial fibrillation identified as etiology of stroke       ______________________________________________________________________________________________          Recent  Labs:  09/19/2023 his serum sodium at that time was 132 lipid profile cholesterol 128 LDL 48 triglycerides 6  Physical Exam:    VS:  BP 130/82   Pulse 63   Ht 5\' 10"  (1.778 m)   Wt 157 lb 12.8 oz (71.6 kg)   SpO2 97%   BMI 22.64 kg/m     Wt Readings from Last 3 Encounters:  02/20/24 157 lb 12.8 oz (71.6 kg)  08/08/23 157 lb 12.8 oz (71.6 kg)  10/25/22 157 lb (71.2 kg)     GEN: He looks stronger well nourished, well developed in no acute distress HEENT: Normal NECK: No JVD; No carotid bruits LYMPHATICS: No lymphadenopathy CARDIAC: RRR, no murmurs, rubs, gallops RESPIRATORY:  Clear to auscultation without rales, wheezing  or rhonchi  ABDOMEN: Soft, non-tender, non-distended MUSCULOSKELETAL:  No edema; No deformity  SKIN: Warm and dry NEUROLOGIC:  Alert and oriented x 3 PSYCHIATRIC:  Normal affect    Signed, Norman Herrlich, MD  02/20/2024 8:24 AM    [facility]

## 2024-02-20 ENCOUNTER — Ambulatory Visit: Payer: Medicare Other | Attending: Cardiology | Admitting: Cardiology

## 2024-02-20 ENCOUNTER — Encounter: Payer: Self-pay | Admitting: Cardiology

## 2024-02-20 VITALS — BP 130/82 | HR 63 | Ht 70.0 in | Wt 157.8 lb

## 2024-02-20 DIAGNOSIS — Z951 Presence of aortocoronary bypass graft: Secondary | ICD-10-CM | POA: Diagnosis not present

## 2024-02-20 DIAGNOSIS — Z9889 Other specified postprocedural states: Secondary | ICD-10-CM

## 2024-02-20 DIAGNOSIS — E782 Mixed hyperlipidemia: Secondary | ICD-10-CM | POA: Diagnosis not present

## 2024-02-20 NOTE — Patient Instructions (Signed)
 Medication Instructions:  Your physician recommends that you continue on your current medications as directed. Please refer to the Current Medication list given to you today.  *If you need a refill on your cardiac medications before your next appointment, please call your pharmacy*   Lab Work: Your physician recommends that you return for lab work in:   Labs today: BMP  If you have labs (blood work) drawn today and your tests are completely normal, you will receive your results only by: MyChart Message (if you have MyChart) OR A paper copy in the mail If you have any lab test that is abnormal or we need to change your treatment, we will call you to review the results.   Testing/Procedures: None   Follow-Up: At [facility], you and your health needs are our priority.  As part of our continuing mission to provide you with exceptional heart care, we have created designated Provider Care Teams.  These Care Teams include your primary Cardiologist (physician) and Advanced Practice Providers (APPs -  Physician Assistants and Nurse Practitioners) who all work together to provide you with the care you need, when you need it.  We recommend signing up for the patient portal called "MyChart".  Sign up information is provided on this After Visit Summary.  MyChart is used to connect with patients for Virtual Visits (Telemedicine).  Patients are able to view lab/test results, encounter notes, upcoming appointments, etc.  Non-urgent messages can be sent to your provider as well.   To learn more about what you can do with MyChart, go to [email or web address].    Your next appointment:   6 month(s)  Provider:   Norman Herrlich, MD    Other Instructions None

## 2024-02-21 LAB — BASIC METABOLIC PANEL
BUN/Creatinine Ratio: 20 (ref 10–24)
BUN: 20 mg/dL (ref 8–27)
CO2: 25 mmol/L (ref 20–29)
Calcium: 9.1 mg/dL (ref 8.6–10.2)
Chloride: 96 mmol/L (ref 96–106)
Creatinine, Ser: 1.01 mg/dL (ref 0.76–1.27)
Glucose: 85 mg/dL (ref 70–99)
Potassium: 4.6 mmol/L (ref 3.5–5.2)
Sodium: 133 mmol/L — ABNORMAL LOW (ref 134–144)
eGFR: 72 mL/min/{1.73_m2} (ref >59)

## 2024-03-12 DIAGNOSIS — R270 Ataxia, unspecified: Secondary | ICD-10-CM | POA: Insufficient documentation

## 2024-04-14 ENCOUNTER — Other Ambulatory Visit: Payer: Self-pay | Admitting: Cardiology

## 2024-04-14 NOTE — Telephone Encounter (Signed)
 Rx refill sent to pharmacy.

## 2024-06-17 NOTE — Therapy (Signed)
 OUTPATIENT PHYSICAL THERAPY NEURO EVALUATION   Patient Name: Jack James MRN: [id number] DOB:[birth year], 89 y.o., male Today's Date: 06/18/2024   PCP: Windy Coy, MD REFERRING PROVIDER: Windy Coy, MD  END OF SESSION:  PT End of Session - 06/18/24 0932     Visit Number 1    Number of Visits 7    Date for PT Re-Evaluation 08/17/24    Authorization Type [employer] MEDICARE    PT Start Time 0930    PT Stop Time 1013    PT Time Calculation (min) 43 min    Equipment Utilized During Treatment Gait belt    Activity Tolerance Patient tolerated treatment well    Behavior During Therapy [facility] for tasks assessed/performed          Past Medical History:  Diagnosis Date   Acute on chronic diastolic heart failure (HCC) 11/19/2015   Atrial fibrillation (HCC) 01/20/2016   postoperative    Atrial fibrillation and flutter (HCC) 01/20/2016   postoperative   Bilateral sensorineural hearing loss 09/19/2023   CAD (coronary artery disease), native coronary artery 08/04/2010   Cath 2011 with severe proximal LAD stenosis treated with 2.75 x 24 mm ion stent postdilated to 3.0 mm by Dr. Wonda  Catheterization 2016 shows 40% ostial LAD, patent stent, 50% mid LAD, 60% circumflex, and 40% RCA stenosis with severe mitral regurgitation.    Chest pain 05/02/2019   Chronic diastolic CHF (congestive heart failure) (HCC)    Coronary artery disease    a. 2011: DES to proximal LAD   Coronary artery disease involving coronary bypass graft of native heart 08/04/2010   Cath 2011 with severe proximal LAD stenosis treated with 2.75 x 24 mm ion stent postdilated to 3.0 mm by Dr. Wonda  Catheterization 2016 shows 40% ostial LAD, patent stent, 50% mid LAD, 60% circumflex, and 40% RCA stenosis with severe mitral regurgitation.   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   Other constipation 05/28/2020   Pleural effusion, bilateral 11/18/2015   S/P CABG x 3 11/25/2015   LIMA to LAD, SVG to RCA, SVG to OM2, EVH via right thigh    S/P mitral valve repair 11/25/2015   Complex valvuloplasty including quadrangular resection of posterior leaflet, artificial Gore-tex neochord placement x10 and 32 mm Sorin Memo 3D Rechord ring annuloplasty   Skin cancer    Stroke (HCC)    Unstable angina (HCC) 11/19/2015   Past Surgical History:  Procedure Laterality Date   CARDIAC CATHETERIZATION N/A 11/19/2015   Procedure: Left Heart Cath and Coronary Angiography;  Surgeon: Lonni JONETTA Cash, MD;  Location: [facility] INVASIVE CV LAB;  Service: Cardiovascular;  Laterality: N/A;   CATARACT EXTRACTION, BILATERAL  2017   CLIPPING OF ATRIAL APPENDAGE  11/25/2015   Procedure: CLIPPING OF ATRIAL APPENDAGE;  Surgeon: Sudie VEAR Laine, MD;  Location: [facility] OR;  Service: Open Heart Surgery;;   CORONARY ARTERY BYPASS GRAFT N/A 11/25/2015   Procedure: CORONARY ARTERY BYPASS GRAFTING (CABG) TIMES THREE USING LEFT INTERNAL MAMMARY  ARTERY AND RIGHT SAPHENOUS LEG VEIN HARVESTED ENDOSCOPICALLY;  Surgeon: Sudie VEAR Laine, MD;  Location: [facility] OR;  Service: Open Heart Surgery;  Laterality: N/A;   CORONARY STENT PLACEMENT  2011   DES to proximal LAD   LEFT HEART CATH AND CORS/GRAFTS ANGIOGRAPHY N/A 05/02/2019   Procedure: LEFT HEART CATH AND CORS/GRAFTS ANGIOGRAPHY;  Surgeon: Claudene Victory ORN, MD;  Location: [facility] INVASIVE CV LAB;  Service: Cardiovascular;  Laterality: N/A;   LITHOTRIPSY     MITRAL VALVE REPAIR N/A 11/25/2015    Procedure: MITRAL VALVE REPAIR (MVR);  Surgeon: Sudie VEAR Laine, MD;  Location: [facility] OR;  Service: Open Heart Surgery;  Laterality: N/A;   SKIN CANCER EXCISION     TEE WITHOUT CARDIOVERSION N/A 11/22/2015   Procedure: TRANSESOPHAGEAL ECHOCARDIOGRAM (TEE);  Surgeon: Vinie JAYSON Maxcy, MD;  Location: [facility] ENDOSCOPY;  Service: Cardiovascular;  Laterality: N/A;   TEE WITHOUT CARDIOVERSION N/A 11/25/2015   Procedure: TRANSESOPHAGEAL ECHOCARDIOGRAM (TEE);  Surgeon: Sudie VEAR Laine, MD;  Location: [facility] OR;  Service: Open Heart Surgery;  Laterality: N/A;   Patient Active Problem List   Diagnosis Date Noted   Bilateral sensorineural hearing loss 09/19/2023   Imbalance 09/19/2023   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   History of colonic polyps 04/19/2022   Coronary artery disease    HLD (hyperlipidemia)    Mitral valve prolapse    Skin cancer    Stroke [facility])    Headache disorder 08/18/2020   Gastroesophageal reflux disease without esophagitis 05/28/2020   Other constipation 05/28/2020   Increased MCV 04/28/2020   History of CVA (cerebrovascular accident) 01/19/2020   Encounter for antineoplastic chemotherapy 12/28/2019   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   Chest pain 05/02/2019   Atrial fibrillation and flutter (HCC) 01/20/2016   Atrial fibrillation (HCC) 01/20/2016   S/P mitral valve repair + CABG x3 11/25/2015   S/P CABG x 3 11/25/2015   Unstable angina (HCC) 11/19/2015   MVP (mitral valve prolapse) 11/19/2015   Chronic diastolic CHF (congestive heart failure) (HCC) 11/19/2015   Acute on chronic diastolic heart failure (HCC) 11/19/2015   Mitral regurgitation 11/19/2015   Hyperlipidemia    Pleural effusion, bilateral 11/18/2015   Coronary artery disease involving coronary bypass graft of native heart 08/04/2010   CAD (coronary artery disease), native coronary artery 08/04/2010    ONSET DATE: 05/01/2024  REFERRING DIAG: R27.0 (ICD-10-CM) - Ataxia, unspecified  THERAPY DIAG:   Unsteadiness on feet  Abnormal posture  Rationale for Evaluation and Treatment: Rehabilitation  SUBJECTIVE:  SUBJECTIVE STATEMENT: Pt denies dizziness, but reports having bad balance that causes issues with his walking and mobility. He is more forwardly flexed and his center of gravity is more forward. This has gotten more pronounced, no back pain. Has a cane and a walker, refuses to use them. Goes and walks in the neighborhood. Tries to go to the gym a couple days a week and does the machines there. Saw PT a couple years ago and did a lot of stretching, did not keep up with his exercises afterwards. Denies double vision or any changes with his vision. Doesn't have a lot of energy.   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 No  Vitals:   06/18/24 0946  BP: (!) 114/58  Pulse: (!) 58     PRECAUTIONS: Fall  FALLS: Has patient fallen in last 6 months? No  LIVING ENVIRONMENT: Lives with: lives with his son  Lives in: House/apartment Stairs: no stairs, one level  Has following equipment at home: Single point cane, Walker - 2 wheeled, and does not use them   PLOF: Independent Son helps with yardwork, just driving in town   PATIENT GOALS: Wants to see what can be worked on for his balance. Improve his posture and mobility in general   OBJECTIVE:  Note: Objective measures were completed at Evaluation unless otherwise noted.  DIAGNOSTIC FINDINGS: MRI brain 12/2021:  IMPRESSION:  1. Mild enhancement in the right orbital  fat is stable but with less T2 signal, compatible with prior radiation therapy. 2. Encephalomalacia at the site of the previous left cerebellar mass. No evidence of residual or recurrent mass. No abnormal enhancement. 3. Stable small vessel disease changes. Unenhancing subcortical T2 hyperintensity in the right frontal lobe is nonspecific but stable. Attention on follow-up. 4. Probable tiny developmental vascular anomaly and right midbrain is stable. No other significant interval changes.  MRI brain 07/10/2022:   IMPRESSION: 1. Stable postsurgical changes left posterior fossa 2. Stable enhancement right orbit 3. Stable foci abnormal white matter signal elsewhere likely small vessel disease 4. 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   COGNITION: Overall cognitive status: Within functional limits for tasks assessed   SENSATION: WFL  COORDINATION: RAM: slight dysmetria with LLE  Heel to shin: WNL    POSTURE: rounded shoulders, forward head, increased thoracic kyphosis, and flexed trunk   LOWER EXTREMITY ROM:    Slightly decr knee extension AROM   LOWER EXTREMITY MMT:    MMT Right Eval Left Eval  Hip flexion 5 5  Hip extension    Hip abduction    Hip adduction    Hip internal rotation    Hip external rotation    Knee flexion 5 5  Knee extension 5 5  Ankle dorsiflexion 5 5  Ankle plantarflexion    Ankle inversion    Ankle eversion    (Blank rows = not tested)   TRANSFERS: Sit to stand: Modified independence  Assistive device utilized: None     Stand to sit: Modified independence  Assistive device utilized: None      GAIT: Findings: Gait Characteristics: step through pattern, decreased stride length, decreased trunk rotation, and trunk flexed, Distance walked: Clinic distances, Assistive device utilized:None, Level of assistance:  SBA, and Comments: pt reporting feeling more off balance when turning quickly or having to change directions   FUNCTIONAL  TESTS:  5 times sit to stand: 9.4 seconds with no UE support  10 meter walk test: 12.9 seconds with no AD = 2.54 ft/sec    M-CTSIB  Condition 1: Firm Surface, EO 30 Sec, Normal Sway  Condition 2: Firm Surface, EC 30 Sec, Normal Sway  Condition 3: Foam Surface, EO 30 Sec, Normal Sway  Condition 4: Foam Surface, EC 30 Sec, Mild Sway    [facility] PT Assessment - 06/18/24 1006       Functional Gait  Assessment   Gait assessed  Yes    Gait Level Surface Walks 20 ft, slow speed, abnormal gait pattern, evidence for imbalance or deviates 10-15 in outside of the 12 in walkway width. Requires more than 7 sec to ambulate 20 ft.   7.6   Change in Gait Speed Able to smoothly change walking speed without loss of balance or gait deviation. Deviate no more than 6 in outside of the 12 in walkway width.    Gait with Horizontal Head Turns Performs head turns with moderate changes in gait velocity, slows down, deviates 10-15 in outside 12 in walkway width but recovers, can continue to walk.    Gait with Vertical Head Turns Performs task with slight change in gait velocity (eg, minor disruption to smooth gait path), deviates 6 - 10 in outside 12 in walkway width or uses assistive device    Gait and Pivot Turn Pivot turns safely within 3 sec and stops quickly with no loss of balance.    Step Over Obstacle Is able to step over one shoe box (4.5 in total height) without changing gait speed. No evidence of imbalance.    Gait with Narrow Base of Support Ambulates less than 4 steps heel to toe or cannot perform without assistance.    Gait with Eyes Closed Cannot walk 20 ft without assistance, severe gait deviations or imbalance, deviates greater than 15 in outside 12 in walkway width or will not attempt task.    Ambulating Backwards Walks 20 ft, no assistive devices, good speed, no evidence for imbalance, normal gait    Steps Alternating feet, must use rail.    Total Score 17    FGA comment: 17/30 = High Fall Risk                                                                                                                                         TREATMENT DATE: 06/18/24  Self-Care: Fall risk, results of outcome measures, what PT will address in regards to balance     PATIENT EDUCATION: Education details: POC (pt only able to come 1x a week due to transportation from his son) Person educated: Patient and Child(ren) Education method: Explanation Education comprehension: verbalized understanding  HOME EXERCISE PROGRAM: Will provide  at future session   GOALS: Goals reviewed with patient? Yes  SHORT TERM GOALS: Target date: 07/16/2024  Pt will be independent with initial HEP for posture, gait, balance in order to build upon functional gains made in therapy. Baseline: Goal status: INITIAL  2.  Pt will improve FGA to at least a 20/30 in order to demo decr fall risk. Baseline: 17/30  Goal status: INITIAL  3.  Pt will improve gait speed with no AD vs. LRAD to at least 2.8 ft/sec in order to demo improved community mobility.   Baseline: 12.9 seconds with no AD = 2.54 ft/sec Goal status: INITIAL  4.  ABC scale to be assessed with LTG written.   Baseline: Goal status: INITIAL   LONG TERM GOALS: Target date: 08/13/2024  Pt will be independent with final HEP for posture, gait, balance in order to build upon functional gains made in therapy. Baseline:  Goal status: INITIAL  2.  Pt will improve FGA to at least a 23/30 in order to demo decr fall risk. Baseline: 17/30 Goal status: INITIAL  3.  Pt will ambulate at least 500' over outdoor unlevel surfaces with LRAD with mod I in order to demo improved community mobility.  Baseline:  Goal status: INITIAL  4.  ABC goal to be written.  Baseline:  Goal status: INITIAL    ASSESSMENT:  CLINICAL IMPRESSION: Patient is a 89 year old male referred to Neuro OPPT for ataxia/imbalance.   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 The following deficits were present during the exam: impaired balance, gait abnormalities, postural abnormalities, impaired coordination. Based on FGA, pt is an incr risk for falls. Based on gait speed with no AD, pt is a limited community ambulator. Pt would benefit from skilled PT to address these impairments and functional limitations to maximize functional mobility independence and decr fall risk.    OBJECTIVE IMPAIRMENTS: Abnormal gait, decreased activity tolerance, decreased balance, decreased coordination, decreased endurance, difficulty walking, impaired flexibility, and postural dysfunction.   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   REHAB POTENTIAL: Good  CLINICAL DECISION MAKING: Stable/uncomplicated  EVALUATION COMPLEXITY: Low  PLAN:  PT FREQUENCY: 1x/week  PT DURATION: 8 weeks  PLANNED INTERVENTIONS: 97164- PT Re-evaluation, 97110-Therapeutic exercises, 97530- Therapeutic activity, [id number]- Neuromuscular re-education, 97535- Self Care, [id number]- Manual therapy, [phone]- Gait training, Patient/Family education, Balance training, Stair training, Vestibular training, and DME instructions  PLAN FOR NEXT SESSION: assess ABC and write LTG. Initiate HEP for balance - EC, narrow BOS, SLS, head motions. Trial a cane, esp for  unlevel surfaces outdoors?    [place] LOISE Senate, PT, DPT 06/18/2024, 10:15 AM

## 2024-06-18 ENCOUNTER — Ambulatory Visit: Attending: Family Medicine | Admitting: Physical Therapy

## 2024-06-18 ENCOUNTER — Encounter: Payer: Self-pay | Admitting: Physical Therapy

## 2024-06-18 VITALS — BP 114/58 | HR 58

## 2024-06-18 DIAGNOSIS — R2681 Unsteadiness on feet: Secondary | ICD-10-CM | POA: Diagnosis present

## 2024-06-18 DIAGNOSIS — R293 Abnormal posture: Secondary | ICD-10-CM | POA: Insufficient documentation

## 2024-07-02 ENCOUNTER — Encounter: Payer: Self-pay | Admitting: Physical Therapy

## 2024-07-02 ENCOUNTER — Ambulatory Visit: Attending: Family Medicine | Admitting: Physical Therapy

## 2024-07-02 DIAGNOSIS — R2681 Unsteadiness on feet: Secondary | ICD-10-CM | POA: Insufficient documentation

## 2024-07-02 DIAGNOSIS — R293 Abnormal posture: Secondary | ICD-10-CM | POA: Insufficient documentation

## 2024-07-02 NOTE — Therapy (Signed)
 OUTPATIENT PHYSICAL THERAPY NEURO TREATMENT   Patient Name: Jack James MRN: [id number] DOB:1934/06/29, 89 y.o., male Today's Date: 07/02/2024   PCP: Windy Coy, MD REFERRING PROVIDER: Windy Coy, MD  END OF SESSION:  PT End of Session - 07/02/24 0849     Visit Number 2    Number of Visits 7    Date for PT Re-Evaluation 08/17/24    Authorization Type [employer]ICARE    PT Start Time [phone]    PT Stop Time 0928    PT Time Calculation (min) 42 min    Equipment Utilized During Treatment Gait belt    Activity Tolerance Patient tolerated treatment well    Behavior During Therapy [facility] for tasks assessed/performed          Past Medical History:  Diagnosis Date   Acute on chronic diastolic heart failure (HCC) 11/19/2015   Atrial fibrillation (HCC) 01/20/2016   postoperative    Atrial fibrillation and flutter (HCC) 01/20/2016   postoperative   Bilateral sensorineural hearing loss 09/19/2023   CAD (coronary artery disease), native coronary artery 08/04/2010   Cath 2011 with severe proximal LAD stenosis treated with 2.75 x 24 mm ion stent postdilated to 3.0 mm by Dr. Wonda  Catheterization 2016 shows 40% ostial LAD, patent stent, 50% mid LAD, 60% circumflex, and 40% RCA stenosis with severe mitral regurgitation.    Chest pain 05/02/2019   Chronic diastolic CHF (congestive heart failure) (HCC)    Coronary artery disease    a. 2011: DES to proximal LAD   Coronary artery disease involving coronary bypass graft of native heart 08/04/2010   Cath 2011 with severe proximal LAD stenosis treated with 2.75 x 24 mm ion stent postdilated to 3.0 mm by Dr. Wonda  Catheterization 2016 shows 40% ostial LAD, patent stent, 50% mid LAD, 60% circumflex, and 40% RCA stenosis with severe mitral regurgitation.   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   Other constipation 05/28/2020   Pleural effusion, bilateral 11/18/2015   S/P CABG x 3 11/25/2015   LIMA to LAD, SVG to RCA, SVG to OM2, EVH via right thigh    S/P mitral valve repair 11/25/2015   Complex valvuloplasty including quadrangular resection of posterior leaflet, artificial Gore-tex neochord placement x10 and 32 mm Sorin Memo 3D Rechord ring annuloplasty   Skin cancer    Stroke (HCC)    Unstable angina (HCC) 11/19/2015   Past Surgical History:  Procedure Laterality Date   CARDIAC CATHETERIZATION N/A 11/19/2015   Procedure: Left Heart Cath and Coronary Angiography;  Surgeon: Lonni JONETTA Cash, MD;  Location: [facility] INVASIVE CV LAB;  Service: Cardiovascular;  Laterality: N/A;   CATARACT EXTRACTION, BILATERAL  2017   CLIPPING OF ATRIAL APPENDAGE  11/25/2015   Procedure: CLIPPING OF ATRIAL APPENDAGE;  Surgeon: Sudie VEAR Laine, MD;  Location: [facility] OR;  Service: Open Heart Surgery;;   CORONARY ARTERY BYPASS GRAFT N/A 11/25/2015   Procedure: CORONARY ARTERY BYPASS GRAFTING (CABG) TIMES THREE USING LEFT INTERNAL MAMMARY  ARTERY AND RIGHT SAPHENOUS LEG VEIN HARVESTED ENDOSCOPICALLY;  Surgeon: Sudie VEAR Laine, MD;  Location: [facility] OR;  Service: Open Heart Surgery;  Laterality: N/A;   CORONARY STENT PLACEMENT  2011   DES to proximal LAD   LEFT HEART CATH AND CORS/GRAFTS ANGIOGRAPHY N/A 05/02/2019   Procedure: LEFT HEART CATH AND CORS/GRAFTS ANGIOGRAPHY;  Surgeon: Claudene Victory ORN, MD;  Location: [facility] INVASIVE CV LAB;  Service: Cardiovascular;  Laterality: N/A;   LITHOTRIPSY     MITRAL VALVE REPAIR N/A 11/25/2015    Procedure: MITRAL VALVE REPAIR (MVR);  Surgeon: Sudie VEAR Laine, MD;  Location: [facility] OR;  Service: Open Heart Surgery;  Laterality: N/A;   SKIN CANCER EXCISION     TEE WITHOUT CARDIOVERSION N/A 11/22/2015   Procedure: TRANSESOPHAGEAL ECHOCARDIOGRAM (TEE);  Surgeon: Vinie JAYSON Maxcy, MD;  Location: [facility] ENDOSCOPY;  Service: Cardiovascular;  Laterality: N/A;   TEE WITHOUT CARDIOVERSION N/A 11/25/2015   Procedure: TRANSESOPHAGEAL ECHOCARDIOGRAM (TEE);  Surgeon: Sudie VEAR Laine, MD;  Location: [facility] OR;  Service: Open Heart Surgery;  Laterality: N/A;   Patient Active Problem List   Diagnosis Date Noted   Bilateral sensorineural hearing loss 09/19/2023   Imbalance 09/19/2023   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   History of colonic polyps 04/19/2022   Coronary artery disease    HLD (hyperlipidemia)    Mitral valve prolapse    Skin cancer    Stroke [facility])    Headache disorder 08/18/2020   Gastroesophageal reflux disease without esophagitis 05/28/2020   Other constipation 05/28/2020   Increased MCV 04/28/2020   History of CVA (cerebrovascular accident) 01/19/2020   Encounter for antineoplastic chemotherapy 12/28/2019   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   Chest pain 05/02/2019   Atrial fibrillation and flutter (HCC) 01/20/2016   Atrial fibrillation (HCC) 01/20/2016   S/P mitral valve repair + CABG x3 11/25/2015   S/P CABG x 3 11/25/2015   Unstable angina (HCC) 11/19/2015   MVP (mitral valve prolapse) 11/19/2015   Chronic diastolic CHF (congestive heart failure) (HCC) 11/19/2015   Acute on chronic diastolic heart failure (HCC) 11/19/2015   Mitral regurgitation 11/19/2015   Hyperlipidemia    Pleural effusion, bilateral 11/18/2015   Coronary artery disease involving coronary bypass graft of native heart 08/04/2010   CAD (coronary artery disease), native coronary artery 08/04/2010    ONSET DATE: 05/01/2024  REFERRING DIAG: R27.0 (ICD-10-CM) - Ataxia, unspecified  THERAPY DIAG:   Unsteadiness on feet  Abnormal posture  Rationale for Evaluation and Treatment: Rehabilitation  SUBJECTIVE:  SUBJECTIVE STATEMENT:  [place]. No falls.   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 No  There were no vitals filed for this visit.  PRECAUTIONS: Fall  FALLS: Has patient fallen in last 6 months? No  LIVING ENVIRONMENT: Lives with: lives with his son  Lives in: House/apartment Stairs: no stairs, one level  Has following equipment at home: Single point cane, Walker - 2 wheeled, and does not use them   PLOF: Independent Son helps with yardwork, just driving in town   PATIENT GOALS: Wants to see what can be worked on for his balance. Improve his posture and mobility in general   OBJECTIVE:  Note: Objective measures were completed at Evaluation unless otherwise noted.  DIAGNOSTIC FINDINGS: MRI brain 12/2021:  IMPRESSION:  1. Mild enhancement in the right orbital fat is stable but with less T2 signal, compatible with prior radiation therapy. 2. Encephalomalacia at the site of the previous left cerebellar mass. No evidence of residual or recurrent mass. No abnormal enhancement. 3. Stable small vessel disease changes. Unenhancing subcortical T2 hyperintensity in the right frontal lobe is nonspecific but stable. Attention on follow-up. 4. Probable tiny developmental vascular anomaly and right midbrain is stable. No other significant interval changes.  MRI brain  07/10/2022:   IMPRESSION: 1. Stable postsurgical changes left posterior fossa 2. Stable enhancement right orbit 3. Stable foci abnormal white matter signal elsewhere likely small vessel disease 4. 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   COGNITION: Overall cognitive status: Within functional limits for tasks assessed   SENSATION: WFL  COORDINATION: RAM: slight dysmetria with LLE  Heel to shin: WNL    POSTURE: rounded shoulders, forward head, increased thoracic kyphosis, and flexed trunk   LOWER EXTREMITY ROM:    Slightly decr knee extension AROM   LOWER EXTREMITY MMT:    MMT Right Eval Left Eval  Hip flexion 5 5  Hip extension    Hip abduction    Hip adduction    Hip internal rotation    Hip external rotation    Knee flexion 5 5  Knee extension 5 5  Ankle dorsiflexion 5 5  Ankle plantarflexion    Ankle inversion    Ankle eversion    (Blank rows = not tested)   TRANSFERS: Sit to stand: Modified independence  Assistive device utilized: None     Stand to sit: Modified independence  Assistive device utilized: None      GAIT: Findings: Gait Characteristics: step through pattern, decreased stride length, decreased trunk rotation, and trunk flexed, Distance walked: Clinic distances, Assistive device utilized:None, Level of assistance: SBA, and Comments: pt reporting feeling more off balance when turning quickly or having to change directions   FUNCTIONAL TESTS:  5 times sit to stand: 9.4 seconds with no UE support  10 meter walk test: 12.9 seconds with no AD = 2.54 ft/sec    M-CTSIB  Condition 1: Firm Surface, EO 30 Sec, Normal Sway  Condition 2: Firm Surface, EC 30 Sec, Normal Sway  Condition 3: Foam Surface, EO 30 Sec, Normal Sway  Condition 4: Foam Surface, EC 30 Sec, Mild Sway  TREATMENT DATE: 07/02/24  Therapeutic  Activity:  Answered pt's sons questions about what neuro PT will address in regards to balance  ABC Scale:  63.75% confident     Access Code: [email or web address] URL: [email or web address] Date: 07/02/2024 Prepared by: Sheffield Senate  Initiated HEP for pt to perform at home with supervision from his son   Exercises - Standing Balance with Eyes Closed on Foam  - 1-2 x daily - 5 x weekly - 3 sets - 30 hold - Romberg Stance on Foam Pad with Head Rotation  - 1-2 x daily - 5 x weekly - 2 sets - 10 reps - also performed with head nods, pt more off balance with head turns  - Tandem Walking with Counter Support  - 1-2 x daily - 5 x weekly - 3 sets - Standing Marching  - 1-2 x daily - 5 x weekly - 3 sets - cues for knee extension in stance leg, intermittent UE support - Walking with Head Rotation  - 1-2 x daily - 5 x weekly - 3 sets - performed down hallway, also performed head nods, pt more unsteady with head turns    PATIENT EDUCATION: Education details: Initial HEP for balance  Person educated: Patient and Child(ren) Education method: Explanation, Demonstration, Verbal cues, and Handouts Education comprehension: verbalized understanding, returned demonstration, and needs further education  HOME EXERCISE PROGRAM:  Access Code: [email or web address] URL: [email or web address] Date: 07/02/2024 Prepared by: Sheffield Senate  Exercises - Standing Balance with Eyes Closed on Foam  - 1-2 x daily - 5 x weekly - 3 sets - 30 hold - Romberg Stance on Foam Pad with Head Rotation  - 1-2 x daily - 5 x weekly - 2 sets - 10 reps - Tandem Walking with Counter Support  - 1-2 x daily - 5 x weekly - 3 sets - Standing Marching  - 1-2 x daily - 5 x weekly - 3 sets - Walking with Head Rotation  - 1-2 x daily - 5 x weekly - 3 sets  GOALS: Goals reviewed with patient? Yes  SHORT TERM GOALS: Target date: 07/16/2024  Pt will be independent with initial HEP for posture, gait, balance in order to build  upon functional gains made in therapy. Baseline: Goal status: INITIAL  2.  Pt will improve FGA to at least a 20/30 in order to demo decr fall risk. Baseline: 17/30  Goal status: INITIAL  3.  Pt will improve gait speed with no AD vs. LRAD to at least 2.8 ft/sec in order to demo improved community mobility.   Baseline: 12.9 seconds with no AD = 2.54 ft/sec Goal status: INITIAL  4.  ABC scale to be assessed with LTG written.   Baseline:63.75% confident  Goal status: MET   LONG TERM GOALS: Target date: 08/13/2024  Pt will be independent with final HEP for posture, gait, balance in order to build upon functional gains made in therapy. Baseline:  Goal status: INITIAL  2.  Pt will improve FGA to at least a 23/30 in order to demo decr fall risk. Baseline: 17/30 Goal status: INITIAL  3.  Pt will ambulate at least 500' over outdoor unlevel surfaces with LRAD with mod I in order to demo improved community mobility.  Baseline:  Goal status: INITIAL  4.  Pt will improve ABC to at least 70% in order to demo improved confidence with balance.  Baseline: 63.75% confident  Goal status: INITIAL    ASSESSMENT:  CLINICAL IMPRESSION: Pt filled out ABC scale (with  direction needed by therapist) and pt scoring 63.75% self confidence in regards to his balance. LTG updated. Remainder of session focused on initiating HEP for pt to perform with son supervision to work on on narrow BOS, head motions, and EC. With head motions tasks, pt more unsteady with head turns compared to head nods. Will continue per POC.    OBJECTIVE IMPAIRMENTS: Abnormal gait, decreased activity tolerance, decreased balance, decreased coordination, decreased endurance, difficulty walking, impaired flexibility, and postural dysfunction.   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   REHAB POTENTIAL: Good  CLINICAL DECISION MAKING: Stable/uncomplicated  EVALUATION COMPLEXITY: Low  PLAN:  PT FREQUENCY: 1x/week  PT DURATION: 8 weeks  PLANNED INTERVENTIONS: 97164- PT Re-evaluation, 97110-Therapeutic exercises, 97530- Therapeutic activity, [id number]- Neuromuscular re-education, 97535- Self Care, [id number]- Manual therapy, [phone]- Gait training, Patient/Family education, Balance training, Stair training, Vestibular training, and DME instructions  PLAN FOR NEXT SESSION: assess ABC and write LTG. Initiate HEP for balance - EC, narrow BOS, SLS, head motions. Trial a cane, esp for unlevel surfaces outdoors?    Sheffield LOISE Senate, PT, DPT 07/02/2024, 11:53 AM

## 2024-07-03 ENCOUNTER — Other Ambulatory Visit: Payer: Self-pay | Admitting: Cardiology

## 2024-07-09 ENCOUNTER — Encounter: Payer: Self-pay | Admitting: Physical Therapy

## 2024-07-09 ENCOUNTER — Ambulatory Visit: Admitting: Physical Therapy

## 2024-07-09 DIAGNOSIS — R293 Abnormal posture: Secondary | ICD-10-CM

## 2024-07-09 DIAGNOSIS — R2681 Unsteadiness on feet: Secondary | ICD-10-CM | POA: Diagnosis not present

## 2024-07-09 NOTE — Therapy (Signed)
 OUTPATIENT PHYSICAL THERAPY NEURO TREATMENT   Patient Name: Jack James MRN: [id number] DOB:[birth year], 89 y.o., male Today's Date: 07/09/2024   PCP: Windy Coy, MD REFERRING PROVIDER: Windy Coy, MD  END OF SESSION:  PT End of Session - 07/09/24 0847     Visit Number 3    Number of Visits 7    Date for PT Re-Evaluation 08/17/24    Authorization Type [employer]ICARE    PT Start Time 0845    PT Stop Time 0928    PT Time Calculation (min) 43 min    Equipment Utilized During Treatment Gait belt    Activity Tolerance Patient tolerated treatment well    Behavior During Therapy [facility] for tasks assessed/performed          Past Medical History:  Diagnosis Date   Acute on chronic diastolic heart failure (HCC) 11/19/2015   Atrial fibrillation (HCC) 01/20/2016   postoperative    Atrial fibrillation and flutter (HCC) 01/20/2016   postoperative   Bilateral sensorineural hearing loss 09/19/2023   CAD (coronary artery disease), native coronary artery 08/04/2010   Cath 2011 with severe proximal LAD stenosis treated with 2.75 x 24 mm ion stent postdilated to 3.0 mm by Dr. Wonda  Catheterization 2016 shows 40% ostial LAD, patent stent, 50% mid LAD, 60% circumflex, and 40% RCA stenosis with severe mitral regurgitation.    Chest pain 05/02/2019   Chronic diastolic CHF (congestive heart failure) (HCC)    Coronary artery disease    a. 2011: DES to proximal LAD   Coronary artery disease involving coronary bypass graft of native heart 08/04/2010   Cath 2011 with severe proximal LAD stenosis treated with 2.75 x 24 mm ion stent postdilated to 3.0 mm by Dr. Wonda  Catheterization 2016 shows 40% ostial LAD, patent stent, 50% mid LAD, 60% circumflex, and 40% RCA stenosis with severe mitral regurgitation.   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   Other constipation 05/28/2020   Pleural effusion, bilateral 11/18/2015   S/P CABG x 3 11/25/2015   LIMA to LAD, SVG to RCA, SVG to OM2, EVH via right thigh    S/P mitral valve repair 11/25/2015   Complex valvuloplasty including quadrangular resection of posterior leaflet, artificial Gore-tex neochord placement x10 and 32 mm Sorin Memo 3D Rechord ring annuloplasty   Skin cancer    Stroke (HCC)    Unstable angina (HCC) 11/19/2015   Past Surgical History:  Procedure Laterality Date   CARDIAC CATHETERIZATION N/A 11/19/2015   Procedure: Left Heart Cath and Coronary Angiography;  Surgeon: Lonni JONETTA Cash, MD;  Location: [facility] INVASIVE CV LAB;  Service: Cardiovascular;  Laterality: N/A;   CATARACT EXTRACTION, BILATERAL  2017   CLIPPING OF ATRIAL APPENDAGE  11/25/2015   Procedure: CLIPPING OF ATRIAL APPENDAGE;  Surgeon: Sudie VEAR Laine, MD;  Location: [facility] OR;  Service: Open Heart Surgery;;   CORONARY ARTERY BYPASS GRAFT N/A 11/25/2015   Procedure: CORONARY ARTERY BYPASS GRAFTING (CABG) TIMES THREE USING LEFT INTERNAL MAMMARY  ARTERY AND RIGHT SAPHENOUS LEG VEIN HARVESTED ENDOSCOPICALLY;  Surgeon: Sudie VEAR Laine, MD;  Location: [facility] OR;  Service: Open Heart Surgery;  Laterality: N/A;   CORONARY STENT PLACEMENT  2011   DES to proximal LAD   LEFT HEART CATH AND CORS/GRAFTS ANGIOGRAPHY N/A 05/02/2019   Procedure: LEFT HEART CATH AND CORS/GRAFTS ANGIOGRAPHY;  Surgeon: Claudene Victory ORN, MD;  Location: [facility] INVASIVE CV LAB;  Service: Cardiovascular;  Laterality: N/A;   LITHOTRIPSY     MITRAL VALVE REPAIR N/A 11/25/2015    Procedure: MITRAL VALVE REPAIR (MVR);  Surgeon: Sudie VEAR Laine, MD;  Location: [facility] OR;  Service: Open Heart Surgery;  Laterality: N/A;   SKIN CANCER EXCISION     TEE WITHOUT CARDIOVERSION N/A 11/22/2015   Procedure: TRANSESOPHAGEAL ECHOCARDIOGRAM (TEE);  Surgeon: Vinie JAYSON Maxcy, MD;  Location: [facility] ENDOSCOPY;  Service: Cardiovascular;  Laterality: N/A;   TEE WITHOUT CARDIOVERSION N/A 11/25/2015   Procedure: TRANSESOPHAGEAL ECHOCARDIOGRAM (TEE);  Surgeon: Sudie VEAR Laine, MD;  Location: [facility] OR;  Service: Open Heart Surgery;  Laterality: N/A;   Patient Active Problem List   Diagnosis Date Noted   Bilateral sensorineural hearing loss 09/19/2023   Imbalance 09/19/2023   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   History of colonic polyps 04/19/2022   Coronary artery disease    HLD (hyperlipidemia)    Mitral valve prolapse    Skin cancer    Stroke [facility])    Headache disorder 08/18/2020   Gastroesophageal reflux disease without esophagitis 05/28/2020   Other constipation 05/28/2020   Increased MCV 04/28/2020   History of CVA (cerebrovascular accident) 01/19/2020   Encounter for antineoplastic chemotherapy 12/28/2019   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   Chest pain 05/02/2019   Atrial fibrillation and flutter (HCC) 01/20/2016   Atrial fibrillation (HCC) 01/20/2016   S/P mitral valve repair + CABG x3 11/25/2015   S/P CABG x 3 11/25/2015   Unstable angina (HCC) 11/19/2015   MVP (mitral valve prolapse) 11/19/2015   Chronic diastolic CHF (congestive heart failure) (HCC) 11/19/2015   Acute on chronic diastolic heart failure (HCC) 11/19/2015   Mitral regurgitation 11/19/2015   Hyperlipidemia    Pleural effusion, bilateral 11/18/2015   Coronary artery disease involving coronary bypass graft of native heart 08/04/2010   CAD (coronary artery disease), native coronary artery 08/04/2010    ONSET DATE: 05/01/2024  REFERRING DIAG: R27.0 (ICD-10-CM) - Ataxia, unspecified  THERAPY DIAG:   Unsteadiness on feet  Abnormal posture  Rationale for Evaluation and Treatment: Rehabilitation  SUBJECTIVE:  SUBJECTIVE STATEMENT: No falls, but did have a stumble. Was grocery shopping and was having to put his bag back in his cart and got a little unsteady and wobbled.   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 No  There were no vitals filed for this visit.  PRECAUTIONS: Fall  FALLS: Has patient fallen in last 6 months? No  LIVING ENVIRONMENT: Lives with: lives with his son  Lives in: House/apartment Stairs: no stairs, one level  Has following equipment at home: Single point cane, Walker - 2 wheeled, and does not use them   PLOF: Independent Son helps with yardwork, just driving in town   PATIENT GOALS: Wants to see what can be worked on for his balance. Improve his posture and mobility in general   OBJECTIVE:  Note: Objective measures were completed at Evaluation unless otherwise noted.  DIAGNOSTIC FINDINGS: MRI brain 12/2021:  IMPRESSION:  1. Mild enhancement in the right orbital fat is stable but with less T2 signal, compatible with prior radiation therapy. 2. Encephalomalacia at the site of the previous left cerebellar mass. No evidence of residual or recurrent mass. No abnormal enhancement. 3. Stable small vessel disease changes. Unenhancing subcortical T2 hyperintensity in the right frontal lobe is nonspecific but stable. Attention on follow-up. 4. Probable tiny developmental vascular anomaly and  right midbrain is stable. No other significant interval changes.  MRI brain 07/10/2022:   IMPRESSION: 1. Stable postsurgical changes left posterior fossa 2. Stable enhancement right orbit 3. Stable foci abnormal white matter signal elsewhere likely small vessel disease 4. 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   COGNITION: Overall cognitive status: Within functional limits for tasks assessed   SENSATION: WFL  COORDINATION: RAM: slight dysmetria with LLE  Heel to shin: WNL    POSTURE: rounded shoulders, forward head, increased thoracic kyphosis, and flexed trunk   LOWER EXTREMITY ROM:    Slightly decr knee extension AROM   LOWER EXTREMITY MMT:    MMT Right Eval Left Eval  Hip flexion 5 5  Hip extension    Hip abduction    Hip adduction    Hip internal rotation    Hip external rotation    Knee flexion 5 5  Knee extension 5 5  Ankle dorsiflexion 5 5  Ankle plantarflexion    Ankle inversion    Ankle eversion    (Blank rows = not tested)   TRANSFERS: Sit to stand: Modified independence  Assistive device utilized: None     Stand to sit: Modified independence  Assistive device utilized: None      GAIT: Findings: Gait Characteristics: step through pattern, decreased stride length, decreased trunk rotation, and trunk flexed, Distance walked: Clinic distances, Assistive device utilized:None, Level of assistance: SBA, and Comments: pt reporting feeling more off balance when turning quickly or having to change directions   FUNCTIONAL TESTS:  5 times sit to stand: 9.4 seconds with no UE support  10 meter walk test: 12.9 seconds with no AD = 2.54 ft/sec    M-CTSIB  Condition 1: Firm Surface, EO 30 Sec, Normal Sway  Condition 2: Firm Surface, EC 30 Sec, Normal Sway  Condition 3: Foam Surface, EO 30 Sec, Normal Sway  Condition 4: Foam  Surface, EC 30 Sec, Mild Sway                                                                                                                               TREATMENT DATE: 07/09/24  Therapeutic Activity:  Reviewed reps and sets of exercises for balance for HEP, pt with no further questions at this time  Showed where to purchase a balance pad for home as needed, pt reporting currently using pillows and they are also challenging  With 6 blaze pods (3 on first step, 3 on 2nd step) to work on weight shifting, SLS, foot clearance, reaction times.  Trying to perform with no UE support, CGA for balance as needed, cued to alternate between legs  Performed 2 bouts of 1 minute each on level ground: 30 hits, 33 hits  Performed 2 bouts of 1 minute each on air ex: 25 hits, 32 hits, intermittent UE support for balance   On rockerboard in A/P direction: 15 reps weight shifting, cues to use ankle strategy and incr weight anteriorly as pt tends to hold weight posteriorly, pt with tendency to bend knees when going forwards Trying to keep board still: 2 x 10 reps head turns, 2 x 10 reps head nods, intermittent UE support, cues for tall posture  Reaching across body and grabbing bean bag from therapist in multi-directions outside of BOS and tossing into crate, cued to look at bean bag when grabbing it, performed 12 reps each side, intermittent taps to bars for balance  On air ex: 10 reps sit <> stands for immediate standing balance, cues for tall posture and scap retraction in standing, pt initially unsteady with first rep, but incr with subsequent reps    PATIENT EDUCATION: Education details: Continue HEP  Person educated: Patient and Child(ren) Education method: Medical illustrator Education comprehension: verbalized understanding, returned demonstration, and needs further education  HOME EXERCISE PROGRAM:  Access Code: [email or web address] URL: [email or web address] Date: 07/02/2024 Prepared by: Sheffield Senate  Exercises - Standing Balance with Eyes Closed on Foam  - 1-2 x daily - 5 x weekly - 3 sets - 30  hold - Romberg Stance on Foam Pad with Head Rotation  - 1-2 x daily - 5 x weekly - 2 sets - 10 reps - Tandem Walking with Counter Support  - 1-2 x daily - 5 x weekly - 3 sets - Standing Marching  - 1-2 x daily - 5 x weekly - 3 sets - Walking with Head Rotation  - 1-2 x daily - 5 x weekly - 3 sets  GOALS: Goals reviewed with patient? Yes  SHORT TERM GOALS: Target date: 07/16/2024  Pt will be independent with initial HEP for posture, gait, balance in order to build upon functional gains made in therapy. Baseline: Goal status: INITIAL  2.  Pt will improve FGA to at least a 20/30 in order to demo decr fall risk. Baseline: 17/30  Goal status: INITIAL  3.  Pt will improve gait speed with no AD vs. LRAD to at least 2.8 ft/sec in order to demo improved community mobility.   Baseline: 12.9 seconds with no AD = 2.54 ft/sec Goal status: INITIAL  4.  ABC scale to be assessed with LTG written.   Baseline:63.75% confident  Goal status: MET   LONG TERM GOALS: Target date: 08/13/2024  Pt will be independent with final HEP for posture, gait, balance in order to build upon functional gains made in therapy. Baseline:  Goal status: INITIAL  2.  Pt will improve FGA to at least a 23/30 in order to demo decr fall risk. Baseline: 17/30 Goal status: INITIAL  3.  Pt will ambulate at least 500' over outdoor unlevel surfaces with LRAD with mod I in order to demo improved community mobility.  Baseline:  Goal status: INITIAL  4.  Pt will improve ABC to at least 70% in order to demo improved confidence with balance.  Baseline: 63.75% confident  Goal status: INITIAL    ASSESSMENT:  CLINICAL IMPRESSION: Today's skilled session focused on working on balance with hip/ankle strategy, SLS stability, compliant surfaces, and reaching outside of BOS. With rockerboard tasks, pt with tendency to have weight more posteriorly on heels. Needs cues to shift weight anteriorly using more hip/ankle strategy  (rather than just flexing his knees). Pt did improve maintaining his balance using appropriate strategy, rather than just grabbing onto bars. Pt has been performing his exercises consistently at home. Will continue per POC.    OBJECTIVE IMPAIRMENTS: Abnormal gait, decreased activity tolerance, decreased balance, decreased coordination, decreased endurance, difficulty walking, impaired flexibility, and postural dysfunction.   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   REHAB POTENTIAL: Good  CLINICAL DECISION MAKING: Stable/uncomplicated  EVALUATION COMPLEXITY: Low  PLAN:  PT FREQUENCY: 1x/week  PT DURATION: 8 weeks  PLANNED INTERVENTIONS: 97164- PT Re-evaluation, 97110-Therapeutic exercises, 97530- Therapeutic activity, 97112- Neuromuscular re-education, 97535- Self Care, [id number]- Manual therapy, [phone]- Gait training, Patient/Family education, Balance training, Stair training, Vestibular training, and DME instructions  PLAN FOR NEXT SESSION: check STGs EC, narrow BOS, SLS, head motions. Work on resisted gait. Trial a cane for unlevel surfaces outdoors?    Sheffield LOISE Senate, PT, DPT 07/09/2024, 9:30 AM

## 2024-07-16 ENCOUNTER — Ambulatory Visit: Admitting: Physical Therapy

## 2024-07-16 ENCOUNTER — Encounter: Payer: Self-pay | Admitting: Physical Therapy

## 2024-07-16 DIAGNOSIS — R2681 Unsteadiness on feet: Secondary | ICD-10-CM

## 2024-07-16 DIAGNOSIS — R293 Abnormal posture: Secondary | ICD-10-CM

## 2024-07-16 NOTE — Patient Instructions (Signed)
 Access Code: [email or web address] URL: [email or web address] Date: 07/02/2024 Prepared by: Sheffield Senate  Exercises - Standing Balance with Eyes Closed on Foam  - 1-2 x daily - 5 x weekly - 3 sets - 30 hold - Romberg Stance on Foam Pad with Head Rotation  - 1-2 x daily - 5 x weekly - 2 sets - 10 reps - Tandem Walking with Counter Support  - 1-2 x daily - 5 x weekly - 3 sets - Standing Marching  - 1-2 x daily - 5 x weekly - 3 sets - Walking with Head Rotation  - 1-2 x daily - 5 x weekly - 3 sets - Scapular retraction with resistance  - 1 x daily - 5 x weekly - 2 sets - 10 reps - Seated Shoulder Diagonal with Resistance  - 1 x daily - 5 x weekly - 1-2 sets - 10 reps

## 2024-07-16 NOTE — Therapy (Signed)
 OUTPATIENT PHYSICAL THERAPY NEURO TREATMENT   Patient Name: Jack James MRN: [id number] DOB:08/29/1934, 89 y.o., male Today's Date: 07/16/2024   PCP: Windy Coy, MD REFERRING PROVIDER: Windy Coy, MD  END OF SESSION:  PT End of Session - 07/16/24 0850     Visit Number 4    Number of Visits 7    Date for PT Re-Evaluation 08/17/24    Authorization Type [employer]ICARE    PT Start Time [phone]    PT Stop Time 0935    PT Time Calculation (min) 48 min    Equipment Utilized During Treatment Gait belt    Activity Tolerance Patient tolerated treatment well    Behavior During Therapy [facility] for tasks assessed/performed          Past Medical History:  Diagnosis Date   Acute on chronic diastolic heart failure (HCC) 11/19/2015   Atrial fibrillation (HCC) 01/20/2016   postoperative    Atrial fibrillation and flutter (HCC) 01/20/2016   postoperative   Bilateral sensorineural hearing loss 09/19/2023   CAD (coronary artery disease), native coronary artery 08/04/2010   Cath 2011 with severe proximal LAD stenosis treated with 2.75 x 24 mm ion stent postdilated to 3.0 mm by Dr. Wonda  Catheterization 2016 shows 40% ostial LAD, patent stent, 50% mid LAD, 60% circumflex, and 40% RCA stenosis with severe mitral regurgitation.    Chest pain 05/02/2019   Chronic diastolic CHF (congestive heart failure) (HCC)    Coronary artery disease    a. 2011: DES to proximal LAD   Coronary artery disease involving coronary bypass graft of native heart 08/04/2010   Cath 2011 with severe proximal LAD stenosis treated with 2.75 x 24 mm ion stent postdilated to 3.0 mm by Dr. Wonda  Catheterization 2016 shows 40% ostial LAD, patent stent, 50% mid LAD, 60% circumflex, and 40% RCA stenosis with severe mitral regurgitation.   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   Other constipation 05/28/2020   Pleural effusion, bilateral 11/18/2015   S/P CABG x 3 11/25/2015   LIMA to LAD, SVG to RCA, SVG to OM2, EVH via right thigh    S/P mitral valve repair 11/25/2015   Complex valvuloplasty including quadrangular resection of posterior leaflet, artificial Gore-tex neochord placement x10 and 32 mm Sorin Memo 3D Rechord ring annuloplasty   Skin cancer    Stroke (HCC)    Unstable angina (HCC) 11/19/2015   Past Surgical History:  Procedure Laterality Date   CARDIAC CATHETERIZATION N/A 11/19/2015   Procedure: Left Heart Cath and Coronary Angiography;  Surgeon: Lonni JONETTA Cash, MD;  Location: [facility] INVASIVE CV LAB;  Service: Cardiovascular;  Laterality: N/A;   CATARACT EXTRACTION, BILATERAL  2017   CLIPPING OF ATRIAL APPENDAGE  11/25/2015   Procedure: CLIPPING OF ATRIAL APPENDAGE;  Surgeon: Sudie VEAR Laine, MD;  Location: [facility] OR;  Service: Open Heart Surgery;;   CORONARY ARTERY BYPASS GRAFT N/A 11/25/2015   Procedure: CORONARY ARTERY BYPASS GRAFTING (CABG) TIMES THREE USING LEFT INTERNAL MAMMARY  ARTERY AND RIGHT SAPHENOUS LEG VEIN HARVESTED ENDOSCOPICALLY;  Surgeon: Sudie VEAR Laine, MD;  Location: [facility] OR;  Service: Open Heart Surgery;  Laterality: N/A;   CORONARY STENT PLACEMENT  2011   DES to proximal LAD   LEFT HEART CATH AND CORS/GRAFTS ANGIOGRAPHY N/A 05/02/2019   Procedure: LEFT HEART CATH AND CORS/GRAFTS ANGIOGRAPHY;  Surgeon: Claudene Victory ORN, MD;  Location: [facility] INVASIVE CV LAB;  Service: Cardiovascular;  Laterality: N/A;   LITHOTRIPSY     MITRAL VALVE REPAIR N/A 11/25/2015    Procedure: MITRAL VALVE REPAIR (MVR);  Surgeon: Sudie VEAR Laine, MD;  Location: [facility] OR;  Service: Open Heart Surgery;  Laterality: N/A;   SKIN CANCER EXCISION     TEE WITHOUT CARDIOVERSION N/A 11/22/2015   Procedure: TRANSESOPHAGEAL ECHOCARDIOGRAM (TEE);  Surgeon: Vinie JAYSON Maxcy, MD;  Location: [facility] ENDOSCOPY;  Service: Cardiovascular;  Laterality: N/A;   TEE WITHOUT CARDIOVERSION N/A 11/25/2015   Procedure: TRANSESOPHAGEAL ECHOCARDIOGRAM (TEE);  Surgeon: Sudie VEAR Laine, MD;  Location: [facility] OR;  Service: Open Heart Surgery;  Laterality: N/A;   Patient Active Problem List   Diagnosis Date Noted   Bilateral sensorineural hearing loss 09/19/2023   Imbalance 09/19/2023   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   History of colonic polyps 04/19/2022   Coronary artery disease    HLD (hyperlipidemia)    Mitral valve prolapse    Skin cancer    Stroke [facility])    Headache disorder 08/18/2020   Gastroesophageal reflux disease without esophagitis 05/28/2020   Other constipation 05/28/2020   Increased MCV 04/28/2020   History of CVA (cerebrovascular accident) 01/19/2020   Encounter for antineoplastic chemotherapy 12/28/2019   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   Chest pain 05/02/2019   Atrial fibrillation and flutter (HCC) 01/20/2016   Atrial fibrillation (HCC) 01/20/2016   S/P mitral valve repair + CABG x3 11/25/2015   S/P CABG x 3 11/25/2015   Unstable angina (HCC) 11/19/2015   MVP (mitral valve prolapse) 11/19/2015   Chronic diastolic CHF (congestive heart failure) (HCC) 11/19/2015   Acute on chronic diastolic heart failure (HCC) 11/19/2015   Mitral regurgitation 11/19/2015   Hyperlipidemia    Pleural effusion, bilateral 11/18/2015   Coronary artery disease involving coronary bypass graft of native heart 08/04/2010   CAD (coronary artery disease), native coronary artery 08/04/2010    ONSET DATE: 05/01/2024  REFERRING DIAG: R27.0 (ICD-10-CM) - Ataxia, unspecified  THERAPY DIAG:   Unsteadiness on feet  Abnormal posture  Rationale for Evaluation and Treatment: Rehabilitation  SUBJECTIVE:  SUBJECTIVE STATEMENT: No falls, no further stumbles recently.  He is having no pain.  He is working on [employer] regularly and has most difficulty w/ tandem and second with marching due to posture.  He and son inquire about things to work on posture.  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 No  There were no vitals filed for this visit.  PRECAUTIONS: Fall  FALLS: Has patient fallen in last 6 months? No  LIVING ENVIRONMENT: Lives with: lives with his son  Lives in: House/apartment Stairs: no stairs, one level  Has following equipment at home: Single point cane, Walker - 2 wheeled, and does not use them   PLOF: Independent Son helps with yardwork, just driving in town   PATIENT GOALS: Wants to see what can be worked on for his balance. Improve his posture and mobility in general   OBJECTIVE:  Note: Objective measures were completed at Evaluation unless otherwise noted.  DIAGNOSTIC FINDINGS: MRI brain 12/2021:  IMPRESSION:  1. Mild enhancement in the right orbital fat is stable but with less T2 signal, compatible with prior radiation therapy. 2. Encephalomalacia at the site of the previous left cerebellar mass. No evidence of residual or recurrent mass. No abnormal enhancement. 3. Stable small vessel disease changes. Unenhancing subcortical T2 hyperintensity in the right frontal lobe is nonspecific but  stable. Attention on follow-up. 4. Probable tiny developmental vascular anomaly and right midbrain is stable. No other significant interval changes.  MRI brain 07/10/2022:   IMPRESSION: 1. Stable postsurgical changes left posterior fossa 2. Stable enhancement right orbit 3. Stable foci abnormal white matter signal elsewhere likely small vessel disease 4. 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   COGNITION: Overall cognitive status: Within functional limits for tasks assessed   SENSATION: WFL  COORDINATION: RAM: slight dysmetria with LLE  Heel to shin: WNL    POSTURE: rounded shoulders, forward head, increased thoracic kyphosis, and flexed trunk   LOWER EXTREMITY ROM:    Slightly decr knee extension AROM   LOWER EXTREMITY MMT:    MMT Right Eval Left Eval  Hip flexion 5 5  Hip extension    Hip abduction    Hip adduction    Hip internal rotation    Hip external rotation    Knee flexion 5 5  Knee extension 5 5  Ankle dorsiflexion 5 5  Ankle plantarflexion    Ankle inversion    Ankle eversion    (Blank rows = not tested)   TRANSFERS: Sit to stand: Modified independence  Assistive device utilized: None     Stand to sit: Modified independence  Assistive device utilized: None      GAIT: Findings: Gait Characteristics: step through pattern, decreased stride length, decreased trunk rotation, and trunk flexed, Distance walked: Clinic distances, Assistive device utilized:None, Level of assistance: SBA, and Comments: pt reporting feeling more off balance when turning quickly or having to change directions   FUNCTIONAL TESTS:  5 times sit to stand: 9.4 seconds with no UE support  10 meter walk test: 12.9 seconds with no AD = 2.54 ft/sec    M-CTSIB  Condition 1: Firm Surface, EO 30 Sec, Normal Sway  Condition 2: Firm Surface, EC 30 Sec, Normal Sway  Condition 3: Foam Surface, EO 30 Sec, Normal Sway  Condition 4: Foam Surface, EC 30 Sec, Mild Sway    [facility] PT  Assessment - 07/16/24 0855       Functional Gait  Assessment   Gait assessed  Yes    Gait Level Surface Walks 20 ft in less than 7 sec but greater than 5.5 sec, uses assistive device, slower speed, mild gait deviations, or deviates 6-10 in outside of the 12 in walkway width.    Change in Gait Speed Able to change speed, demonstrates mild gait deviations, deviates 6-10 in outside of the 12 in walkway width, or no gait deviations, unable to achieve a major change in velocity, or uses a change in velocity, or uses an assistive device.    Gait with Horizontal Head Turns Performs head turns smoothly with slight change in gait velocity (eg, minor disruption to smooth gait path), deviates 6-10 in outside 12 in walkway width, or uses an assistive device.    Gait with Vertical Head Turns Performs task with slight change in gait velocity (eg, minor disruption to smooth gait path), deviates 6 - 10 in outside 12 in walkway width or uses assistive device    Gait and Pivot Turn Pivot turns safely in greater than 3 sec and stops with no loss of balance, or pivot turns safely within 3 sec and stops with mild imbalance, requires small steps to catch balance.    Step Over Obstacle Is able to step over one shoe box (4.5 in total height) without changing gait speed. No evidence of imbalance.    Gait with Narrow Base of Support Ambulates less than 4 steps heel to toe or cannot perform without assistance.    Gait with Eyes Closed Walks 20 ft, slow speed, abnormal gait pattern, evidence for imbalance, deviates 10-15 in outside 12 in walkway width. Requires more than 9 sec to ambulate 20 ft.    Ambulating Backwards Walks 20 ft, uses assistive device, slower speed, mild gait deviations, deviates 6-10 in outside 12 in walkway width.    Steps Alternating feet, must use rail.    Total Score 17    FGA comment: 17/30 = significant fall risk                                                                                                                                    TREATMENT DATE: 07/16/24 FGA:  [facility] PT Assessment - 07/16/24 0855       Functional Gait  Assessment   Gait assessed  Yes    Gait Level Surface Walks 20 ft in less than 7 sec but greater than 5.5 sec, uses assistive device, slower speed, mild gait deviations, or deviates 6-10 in outside of the 12 in walkway width.    Change in Gait Speed Able to change speed, demonstrates mild gait deviations, deviates 6-10 in outside of the 12 in walkway width, or  no gait deviations, unable to achieve a major change in velocity, or uses a change in velocity, or uses an assistive device.    Gait with Horizontal Head Turns Performs head turns smoothly with slight change in gait velocity (eg, minor disruption to smooth gait path), deviates 6-10 in outside 12 in walkway width, or uses an assistive device.    Gait with Vertical Head Turns Performs task with slight change in gait velocity (eg, minor disruption to smooth gait path), deviates 6 - 10 in outside 12 in walkway width or uses assistive device    Gait and Pivot Turn Pivot turns safely in greater than 3 sec and stops with no loss of balance, or pivot turns safely within 3 sec and stops with mild imbalance, requires small steps to catch balance.    Step Over Obstacle Is able to step over one shoe box (4.5 in total height) without changing gait speed. No evidence of imbalance.    Gait with Narrow Base of Support Ambulates less than 4 steps heel to toe or cannot perform without assistance.    Gait with Eyes Closed Walks 20 ft, slow speed, abnormal gait pattern, evidence for imbalance, deviates 10-15 in outside 12 in walkway width. Requires more than 9 sec to ambulate 20 ft.    Ambulating Backwards Walks 20 ft, uses assistive device, slower speed, mild gait deviations, deviates 6-10 in outside 12 in walkway width.    Steps Alternating feet, must use rail.    Total Score 17    FGA comment: 17/30 = significant fall risk          - (self-selected speed):  11.03 sec SBA no AD = 0.91 m/sec OR 2.99 ft/sec - (fast):  8.94 sec CGA no AD single left toe scuff = 1.12 m/sec OR 3.69 ft/sec -Seated scapular squeeze w/ resistance x15 red band -STS on firm w/ bilateral shoulder flexion and upward gaze x12 > added foam surface x12 SBA-CGA -Diagonal shoulder pulls x15 each UE  PATIENT EDUCATION: Education details: Continue HEP w/ additions today.  Posture and balance as pt and son concerned about rounded posture and wanting ways to address this.  Discussed tandem progression at home as he is struggling with how to progress this.  Discussed stair safety when encountering stairs without rail - using step to pattern and recommending rail for safest general mobility. Person educated: Patient and Child(ren) Education method: Medical illustrator Education comprehension: verbalized understanding, returned demonstration, and needs further education  HOME EXERCISE PROGRAM:  Access Code: [email or web address] URL: [email or web address] Date: 07/02/2024 Prepared by: Sheffield Senate  Exercises - Standing Balance with Eyes Closed on Foam  - 1-2 x daily - 5 x weekly - 3 sets - 30 hold - Romberg Stance on Foam Pad with Head Rotation  - 1-2 x daily - 5 x weekly - 2 sets - 10 reps - Tandem Walking with Counter Support  - 1-2 x daily - 5 x weekly - 3 sets - Standing Marching  - 1-2 x daily - 5 x weekly - 3 sets - Walking with Head Rotation  - 1-2 x daily - 5 x weekly - 3 sets - Scapular retraction with resistance  - 1 x daily - 5 x weekly - 2 sets - 10 reps - Seated Shoulder Diagonal with Resistance  - 1 x daily - 5 x weekly - 1-2 sets - 10 reps  GOALS: Goals reviewed with patient? Yes  SHORT TERM GOALS: Target date: 07/16/2024  Pt will be  independent with initial HEP for posture, gait, balance in order to build upon functional gains made in therapy. Baseline:  IND and Compliant (7/23) Goal status:  MET  2.  Pt will improve FGA to at least a 20/30 in order to demo decr fall risk. Baseline: 17/30; 17/30 (7/23) Goal status: NOT MET  3.  Pt will improve gait speed with no AD vs. LRAD to at least 2.8 ft/sec in order to demo improved community mobility.  Baseline: 12.9 seconds with no AD = 2.54 ft/sec; 2.99 ft/sec no AD SBA (7/23) Goal status: MET  4.  ABC scale to be assessed with LTG written.   Baseline:63.75% confident  Goal status: MET   LONG TERM GOALS: Target date: 08/13/2024  Pt will be independent with final HEP for posture, gait, balance in order to build upon functional gains made in therapy. Baseline:  Goal status: INITIAL  2.  Pt will improve FGA to at least a 20/30 in order to demo decr fall risk. Baseline: 17/30 Goal status: REVISED  3.  Pt will ambulate at least 500' over outdoor unlevel surfaces with LRAD with mod I in order to demo improved community mobility.  Baseline:  Goal status: INITIAL  4.  Pt will improve ABC to at least 70% in order to demo improved confidence with balance.  Baseline: 63.75% confident  Goal status: INITIAL    ASSESSMENT:  CLINICAL IMPRESSION: Focus of skilled session variable today with patient and son concerned about postural contributions to balance and ways to address this.  PT provided additions to HEP focused on this and combining posture challenges with anticipatory balance strategies during STS variations.  He does well with all tasks this session.  Also assessed STGs with pt remaining compliant and independent with previously established HEP.  His FGA remained unchanged, but his gait speed was much improved at 2.99 ft/sec SBA without AD compared to 2.54 ft/sec previously.  Will continue per POC.    OBJECTIVE IMPAIRMENTS: Abnormal gait, decreased activity tolerance, decreased balance, decreased coordination, decreased endurance, difficulty walking, impaired flexibility, and postural dysfunction.   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   REHAB POTENTIAL: Good  CLINICAL DECISION MAKING: Stable/uncomplicated  EVALUATION COMPLEXITY: Low  PLAN:  PT FREQUENCY: 1x/week  PT DURATION: 8 weeks  PLANNED INTERVENTIONS: 97164- PT Re-evaluation, 97110-Therapeutic exercises, 97530- Therapeutic activity, 97112- Neuromuscular re-education, 97535- Self Care, [id number]- Manual therapy, [phone]- Gait training, Patient/Family education, Balance training, Stair training, Vestibular training, and DME instructions  PLAN FOR NEXT SESSION: EC, narrow BOS, SLS, head motions. Work on resisted gait. Trial a cane for unlevel surfaces outdoors?    Daved KATHEE Bull, PT, DPT 07/16/2024, 10:05 AM

## 2024-07-23 ENCOUNTER — Encounter: Payer: Self-pay | Admitting: Physical Therapy

## 2024-07-23 ENCOUNTER — Ambulatory Visit: Admitting: Physical Therapy

## 2024-07-23 DIAGNOSIS — R2681 Unsteadiness on feet: Secondary | ICD-10-CM | POA: Diagnosis not present

## 2024-07-23 DIAGNOSIS — R293 Abnormal posture: Secondary | ICD-10-CM

## 2024-07-23 NOTE — Therapy (Signed)
 OUTPATIENT PHYSICAL THERAPY NEURO TREATMENT   Patient Name: Jack James MRN: [id number] DOB:Jun 16, 1934, 89 y.o., male Today's Date: 07/23/2024   PCP: Windy Coy, MD REFERRING PROVIDER: Windy Coy, MD  END OF SESSION:  PT End of Session - 07/23/24 1405     Visit Number 5    Number of Visits 7    Date for PT Re-Evaluation 08/17/24    Authorization Type [employer]ICARE    PT Start Time 1403    PT Stop Time 1447    PT Time Calculation (min) 44 min    Equipment Utilized During Treatment Gait belt    Activity Tolerance Patient tolerated treatment well    Behavior During Therapy [facility] for tasks assessed/performed          Past Medical History:  Diagnosis Date   Acute on chronic diastolic heart failure (HCC) 11/19/2015   Atrial fibrillation (HCC) 01/20/2016   postoperative    Atrial fibrillation and flutter (HCC) 01/20/2016   postoperative   Bilateral sensorineural hearing loss 09/19/2023   CAD (coronary artery disease), native coronary artery 08/04/2010   Cath 2011 with severe proximal LAD stenosis treated with 2.75 x 24 mm ion stent postdilated to 3.0 mm by Dr. Wonda  Catheterization 2016 shows 40% ostial LAD, patent stent, 50% mid LAD, 60% circumflex, and 40% RCA stenosis with severe mitral regurgitation.    Chest pain 05/02/2019   Chronic diastolic CHF (congestive heart failure) (HCC)    Coronary artery disease    a. 2011: DES to proximal LAD   Coronary artery disease involving coronary bypass graft of native heart 08/04/2010   Cath 2011 with severe proximal LAD stenosis treated with 2.75 x 24 mm ion stent postdilated to 3.0 mm by Dr. Wonda  Catheterization 2016 shows 40% ostial LAD, patent stent, 50% mid LAD, 60% circumflex, and 40% RCA stenosis with severe mitral regurgitation.   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   Other constipation 05/28/2020   Pleural effusion, bilateral 11/18/2015   S/P CABG x 3 11/25/2015   LIMA to LAD, SVG to RCA, SVG to OM2, EVH via right thigh    S/P mitral valve repair 11/25/2015   Complex valvuloplasty including quadrangular resection of posterior leaflet, artificial Gore-tex neochord placement x10 and 32 mm Sorin Memo 3D Rechord ring annuloplasty   Skin cancer    Stroke (HCC)    Unstable angina (HCC) 11/19/2015   Past Surgical History:  Procedure Laterality Date   CARDIAC CATHETERIZATION N/A 11/19/2015   Procedure: Left Heart Cath and Coronary Angiography;  Surgeon: Lonni JONETTA Cash, MD;  Location: [facility] INVASIVE CV LAB;  Service: Cardiovascular;  Laterality: N/A;   CATARACT EXTRACTION, BILATERAL  2017   CLIPPING OF ATRIAL APPENDAGE  11/25/2015   Procedure: CLIPPING OF ATRIAL APPENDAGE;  Surgeon: Sudie VEAR Laine, MD;  Location: [facility] OR;  Service: Open Heart Surgery;;   CORONARY ARTERY BYPASS GRAFT N/A 11/25/2015   Procedure: CORONARY ARTERY BYPASS GRAFTING (CABG) TIMES THREE USING LEFT INTERNAL MAMMARY  ARTERY AND RIGHT SAPHENOUS LEG VEIN HARVESTED ENDOSCOPICALLY;  Surgeon: Sudie VEAR Laine, MD;  Location: [facility] OR;  Service: Open Heart Surgery;  Laterality: N/A;   CORONARY STENT PLACEMENT  2011   DES to proximal LAD   LEFT HEART CATH AND CORS/GRAFTS ANGIOGRAPHY N/A 05/02/2019   Procedure: LEFT HEART CATH AND CORS/GRAFTS ANGIOGRAPHY;  Surgeon: Claudene Victory ORN, MD;  Location: [facility] INVASIVE CV LAB;  Service: Cardiovascular;  Laterality: N/A;   LITHOTRIPSY     MITRAL VALVE REPAIR N/A 11/25/2015    Procedure: MITRAL VALVE REPAIR (MVR);  Surgeon: Sudie VEAR Laine, MD;  Location: [facility] OR;  Service: Open Heart Surgery;  Laterality: N/A;   SKIN CANCER EXCISION     TEE WITHOUT CARDIOVERSION N/A 11/22/2015   Procedure: TRANSESOPHAGEAL ECHOCARDIOGRAM (TEE);  Surgeon: Vinie JAYSON Maxcy, MD;  Location: [facility] ENDOSCOPY;  Service: Cardiovascular;  Laterality: N/A;   TEE WITHOUT CARDIOVERSION N/A 11/25/2015   Procedure: TRANSESOPHAGEAL ECHOCARDIOGRAM (TEE);  Surgeon: Sudie VEAR Laine, MD;  Location: [facility] OR;  Service: Open Heart Surgery;  Laterality: N/A;   Patient Active Problem List   Diagnosis Date Noted   Bilateral sensorineural hearing loss 09/19/2023   Imbalance 09/19/2023   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   History of colonic polyps 04/19/2022   Coronary artery disease    HLD (hyperlipidemia)    Mitral valve prolapse    Skin cancer    Stroke [facility])    Headache disorder 08/18/2020   Gastroesophageal reflux disease without esophagitis 05/28/2020   Other constipation 05/28/2020   Increased MCV 04/28/2020   History of CVA (cerebrovascular accident) 01/19/2020   Encounter for antineoplastic chemotherapy 12/28/2019   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   Chest pain 05/02/2019   Atrial fibrillation and flutter (HCC) 01/20/2016   Atrial fibrillation (HCC) 01/20/2016   S/P mitral valve repair + CABG x3 11/25/2015   S/P CABG x 3 11/25/2015   Unstable angina (HCC) 11/19/2015   MVP (mitral valve prolapse) 11/19/2015   Chronic diastolic CHF (congestive heart failure) (HCC) 11/19/2015   Acute on chronic diastolic heart failure (HCC) 11/19/2015   Mitral regurgitation 11/19/2015   Hyperlipidemia    Pleural effusion, bilateral 11/18/2015   Coronary artery disease involving coronary bypass graft of native heart 08/04/2010   CAD (coronary artery disease), native coronary artery 08/04/2010    ONSET DATE: 05/01/2024  REFERRING DIAG: R27.0 (ICD-10-CM) - Ataxia, unspecified  THERAPY DIAG:   Unsteadiness on feet  Abnormal posture  Rationale for Evaluation and Treatment: Rehabilitation  SUBJECTIVE:  SUBJECTIVE STATEMENT: No falls, no further stumbles recently.  He is having no pain.  He is working on [employer] regularly and has questions about a couple.  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 No  There were no vitals filed for this visit.  PRECAUTIONS: Fall  FALLS: Has patient fallen in last 6 months? No  LIVING ENVIRONMENT: Lives with: lives with his son  Lives in: House/apartment Stairs: no stairs, one level  Has following equipment at home: Single point cane, Walker - 2 wheeled, and does not use them   PLOF: Independent Son helps with yardwork, just driving in town   PATIENT GOALS: Wants to see what can be worked on for his balance. Improve his posture and mobility in general   OBJECTIVE:  Note: Objective measures were completed at Evaluation unless otherwise noted.  DIAGNOSTIC FINDINGS: MRI brain 12/2021:  IMPRESSION:  1. Mild enhancement in the right orbital fat is stable but with less T2 signal, compatible with prior radiation therapy. 2. Encephalomalacia at the site of the previous left cerebellar mass. No evidence of residual or recurrent mass. No abnormal enhancement. 3. Stable small vessel disease changes. Unenhancing subcortical T2 hyperintensity in the right frontal lobe is nonspecific but stable. Attention on follow-up. 4. Probable tiny developmental vascular anomaly and right midbrain  is stable. No other significant interval changes.  MRI brain 07/10/2022:   IMPRESSION: 1. Stable postsurgical changes left posterior fossa 2. Stable enhancement right orbit 3. Stable foci abnormal white matter signal elsewhere likely small vessel disease 4. 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   COGNITION: Overall cognitive status: Within functional limits for tasks assessed   SENSATION: WFL  COORDINATION: RAM: slight dysmetria with LLE  Heel to shin: WNL    POSTURE: rounded shoulders, forward head, increased thoracic kyphosis, and flexed trunk   LOWER EXTREMITY ROM:    Slightly decr knee extension AROM   LOWER EXTREMITY MMT:    MMT Right Eval Left Eval  Hip flexion 5 5  Hip extension    Hip abduction    Hip adduction    Hip internal rotation    Hip external rotation    Knee flexion 5 5  Knee extension 5 5  Ankle dorsiflexion 5 5  Ankle plantarflexion    Ankle inversion    Ankle eversion    (Blank rows = not tested)   TRANSFERS: Sit to stand: Modified independence  Assistive device utilized: None     Stand to sit: Modified independence  Assistive device utilized: None      GAIT: Findings: Gait Characteristics: step through pattern, decreased stride length, decreased trunk rotation, and trunk flexed, Distance walked: Clinic distances, Assistive device utilized:None, Level of assistance: SBA, and Comments: pt reporting feeling more off balance when turning quickly or having to change directions   FUNCTIONAL TESTS:  5 times sit to stand: 9.4 seconds with no UE support  10 meter walk test: 12.9 seconds with no AD = 2.54 ft/sec    M-CTSIB  Condition 1: Firm Surface, EO 30 Sec, Normal Sway  Condition 2: Firm Surface, EC 30 Sec, Normal Sway  Condition 3: Foam Surface, EO 30 Sec, Normal Sway  Condition 4: Foam Surface, EC 30 Sec,  Mild Sway                                                                                                                                TREATMENT DATE: 07/23/24 On airex:  -Narrowed stance eyes closed 4x30-45 seconds each unsupported SBA-CGA  -Semi-tandem 4x30-40 seconds each LE in rear unsupported SBA-CGA  -Marching in place 2x1 minute no UE support SBA cued to help maintain center of mat  -LE taps to 4th 6 step progressing to no UE support over 3x5 each LE  4x30 ft shoulder to contralateral hip pass patient to therapist w/ 500 gr ball to faciliate walking w/ diagonal head motion Resisted walking 1x115' > 2x115' multidirectional pulls w/ variability in crossover stepping close SBA-CGA for 2 stumbles  PATIENT EDUCATION: Education details: Continue HEP w/ questions answered about foam vs pillows and form for postural strengthening.   Person educated: Patient and Child(ren) Education method: Medical illustrator Education comprehension: verbalized understanding, returned demonstration, and needs further education  HOME EXERCISE PROGRAM:  Access Code: [email or web address] URL: [email or web address] Date: 07/02/2024 Prepared by: Sheffield Senate  Exercises - Standing Balance with Eyes Closed on Foam  - 1-2 x daily - 5 x weekly - 3 sets - 30 hold - Romberg Stance on Foam Pad with Head Rotation  - 1-2 x daily - 5 x weekly - 2 sets - 10 reps - Tandem Walking with Counter Support  - 1-2 x daily - 5 x weekly - 3 sets - Standing Marching  - 1-2 x daily - 5 x weekly - 3 sets - Walking with Head Rotation  - 1-2 x daily - 5 x weekly - 3 sets - Scapular retraction with resistance  - 1 x daily - 5 x weekly - 2 sets - 10 reps - Seated Shoulder Diagonal with Resistance  - 1 x daily - 5 x weekly - 1-2 sets - 10 reps  GOALS: Goals reviewed with patient? Yes  SHORT TERM GOALS: Target date: 07/16/2024  Pt will be independent with initial HEP for posture, gait, balance in order to build upon functional gains made in therapy. Baseline:  IND and Compliant (7/23) Goal status: MET  2.  Pt will improve FGA  to at least a 20/30 in order to demo decr fall risk. Baseline: 17/30; 17/30 (7/23) Goal status: NOT MET  3.  Pt will improve gait speed with no AD vs. LRAD to at least 2.8 ft/sec in order to demo improved community mobility.  Baseline: 12.9 seconds with no AD = 2.54 ft/sec; 2.99 ft/sec no AD SBA (7/23) Goal status: MET  4.  ABC scale to be assessed with LTG written.   Baseline:63.75% confident  Goal status: MET   LONG TERM GOALS: Target date: 08/13/2024  Pt will be independent with final HEP for posture, gait, balance in order to build upon functional gains made in therapy. Baseline:  Goal status: INITIAL  2.  Pt will improve  FGA to at least a 20/30 in order to demo decr fall risk. Baseline: 17/30 Goal status: REVISED  3.  Pt will ambulate at least 500' over outdoor unlevel surfaces with LRAD with mod I in order to demo improved community mobility.  Baseline:  Goal status: INITIAL  4.  Pt will improve ABC to at least 70% in order to demo improved confidence with balance.  Baseline: 63.75% confident  Goal status: INITIAL    ASSESSMENT:  CLINICAL IMPRESSION: Focus of skilled session today on general NMR to improve stability over narrowed an variable BOS and w/ head movement at diagonals.  He was very challenged with resisted gait requiring repeated stepping strategy and prolonged dynamic head motion.  He continues to benefit from skilled PT in this setting to improve high level dynamic stability including ability to maneuver outdoors safely. Will continue per POC in order to optimize functional independence.    OBJECTIVE IMPAIRMENTS: Abnormal gait, decreased activity tolerance, decreased balance, decreased coordination, decreased endurance, difficulty walking, impaired flexibility, and postural dysfunction.   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   REHAB POTENTIAL: Good  CLINICAL DECISION MAKING: Stable/uncomplicated  EVALUATION COMPLEXITY: Low  PLAN:  PT FREQUENCY: 1x/week  PT DURATION: 8 weeks  PLANNED INTERVENTIONS: 97164- PT Re-evaluation, 97110-Therapeutic exercises, 97530- Therapeutic activity, 97112- Neuromuscular re-education, 97535- Self Care, [id number]- Manual therapy, [id number]- Gait training, Patient/Family education, Balance training, Stair training, Vestibular training, and DME instructions  PLAN FOR NEXT SESSION: EC, narrow BOS, SLS, head motions vertically and horizonatally - ball toss vs step and toss laterally. Work on resisted gait. Trial a cane for unlevel surfaces outdoors?  Squig pulls from floor and mirror - add blue mat.  Blaze pods w/ floor reach?   Daved KATHEE Bull, PT, DPT 07/23/2024, 2:51 PM

## 2024-07-30 ENCOUNTER — Ambulatory Visit: Attending: Family Medicine | Admitting: Physical Therapy

## 2024-07-30 ENCOUNTER — Encounter: Payer: Self-pay | Admitting: Physical Therapy

## 2024-07-30 DIAGNOSIS — R2681 Unsteadiness on feet: Secondary | ICD-10-CM | POA: Insufficient documentation

## 2024-07-30 DIAGNOSIS — R293 Abnormal posture: Secondary | ICD-10-CM | POA: Insufficient documentation

## 2024-07-30 NOTE — Therapy (Signed)
 OUTPATIENT PHYSICAL THERAPY NEURO TREATMENT   Patient Name: Jack James MRN: [id number] DOB:[birth year], 89 y.o., male Today's Date: 07/30/2024   PCP: Windy Coy, MD REFERRING PROVIDER: Windy Coy, MD  END OF SESSION:  PT End of Session - 07/30/24 1106     Visit Number 6    Number of Visits 7    Date for PT Re-Evaluation 08/17/24    Authorization Type [employer] MEDICARE    PT Start Time 1102    PT Stop Time 1147    PT Time Calculation (min) 45 min    Equipment Utilized During Treatment Gait belt    Activity Tolerance Patient tolerated treatment well    Behavior During Therapy [facility] for tasks assessed/performed          Past Medical History:  Diagnosis Date   Acute on chronic diastolic heart failure (HCC) 11/19/2015   Atrial fibrillation (HCC) 01/20/2016   postoperative    Atrial fibrillation and flutter (HCC) 01/20/2016   postoperative   Bilateral sensorineural hearing loss 09/19/2023   CAD (coronary artery disease), native coronary artery 08/04/2010   Cath 2011 with severe proximal LAD stenosis treated with 2.75 x 24 mm ion stent postdilated to 3.0 mm by Dr. Wonda  Catheterization 2016 shows 40% ostial LAD, patent stent, 50% mid LAD, 60% circumflex, and 40% RCA stenosis with severe mitral regurgitation.    Chest pain 05/02/2019   Chronic diastolic CHF (congestive heart failure) (HCC)    Coronary artery disease    a. 2011: DES to proximal LAD   Coronary artery disease involving coronary bypass graft of native heart 08/04/2010   Cath 2011 with severe proximal LAD stenosis treated with 2.75 x 24 mm ion stent postdilated to 3.0 mm by Dr. Wonda  Catheterization 2016 shows 40% ostial LAD, patent stent, 50% mid LAD, 60% circumflex, and 40% RCA stenosis with severe mitral regurgitation.   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   Other constipation 05/28/2020   Pleural effusion, bilateral 11/18/2015   S/P CABG x 3 11/25/2015   LIMA to LAD, SVG to RCA, SVG to OM2, EVH via right thigh    S/P mitral valve repair 11/25/2015   Complex valvuloplasty including quadrangular resection of posterior leaflet, artificial Gore-tex neochord placement x10 and 32 mm Sorin Memo 3D Rechord ring annuloplasty   Skin cancer    Stroke (HCC)    Unstable angina (HCC) 11/19/2015   Past Surgical History:  Procedure Laterality Date   CARDIAC CATHETERIZATION N/A 11/19/2015   Procedure: Left Heart Cath and Coronary Angiography;  Surgeon: Lonni JONETTA Cash, MD;  Location: [facility] INVASIVE CV LAB;  Service: Cardiovascular;  Laterality: N/A;   CATARACT EXTRACTION, BILATERAL  2017   CLIPPING OF ATRIAL APPENDAGE  11/25/2015   Procedure: CLIPPING OF ATRIAL APPENDAGE;  Surgeon: Sudie VEAR Laine, MD;  Location: [facility] OR;  Service: Open Heart Surgery;;   CORONARY ARTERY BYPASS GRAFT N/A 11/25/2015   Procedure: CORONARY ARTERY BYPASS GRAFTING (CABG) TIMES THREE USING LEFT INTERNAL MAMMARY  ARTERY AND RIGHT SAPHENOUS LEG VEIN HARVESTED ENDOSCOPICALLY;  Surgeon: Sudie VEAR Laine, MD;  Location: [facility] OR;  Service: Open Heart Surgery;  Laterality: N/A;   CORONARY STENT PLACEMENT  2011   DES to proximal LAD   LEFT HEART CATH AND CORS/GRAFTS ANGIOGRAPHY N/A 05/02/2019   Procedure: LEFT HEART CATH AND CORS/GRAFTS ANGIOGRAPHY;  Surgeon: Claudene Victory ORN, MD;  Location: [facility] INVASIVE CV LAB;  Service: Cardiovascular;  Laterality: N/A;   LITHOTRIPSY     MITRAL VALVE REPAIR N/A 11/25/2015    Procedure: MITRAL VALVE REPAIR (MVR);  Surgeon: Sudie VEAR Laine, MD;  Location: [facility] OR;  Service: Open Heart Surgery;  Laterality: N/A;   SKIN CANCER EXCISION     TEE WITHOUT CARDIOVERSION N/A 11/22/2015   Procedure: TRANSESOPHAGEAL ECHOCARDIOGRAM (TEE);  Surgeon: Vinie JAYSON Maxcy, MD;  Location: [facility] ENDOSCOPY;  Service: Cardiovascular;  Laterality: N/A;   TEE WITHOUT CARDIOVERSION N/A 11/25/2015   Procedure: TRANSESOPHAGEAL ECHOCARDIOGRAM (TEE);  Surgeon: Sudie VEAR Laine, MD;  Location: [facility] OR;  Service: Open Heart Surgery;  Laterality: N/A;   Patient Active Problem List   Diagnosis Date Noted   Bilateral sensorineural hearing loss 09/19/2023   Imbalance 09/19/2023   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   History of colonic polyps 04/19/2022   Coronary artery disease    HLD (hyperlipidemia)    Mitral valve prolapse    Skin cancer    Stroke [facility])    Headache disorder 08/18/2020   Gastroesophageal reflux disease without esophagitis 05/28/2020   Other constipation 05/28/2020   Increased MCV 04/28/2020   History of CVA (cerebrovascular accident) 01/19/2020   Encounter for antineoplastic chemotherapy 12/28/2019   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   Chest pain 05/02/2019   Atrial fibrillation and flutter (HCC) 01/20/2016   Atrial fibrillation (HCC) 01/20/2016   S/P mitral valve repair + CABG x3 11/25/2015   S/P CABG x 3 11/25/2015   Unstable angina (HCC) 11/19/2015   MVP (mitral valve prolapse) 11/19/2015   Chronic diastolic CHF (congestive heart failure) (HCC) 11/19/2015   Acute on chronic diastolic heart failure (HCC) 11/19/2015   Mitral regurgitation 11/19/2015   Hyperlipidemia    Pleural effusion, bilateral 11/18/2015   Coronary artery disease involving coronary bypass graft of native heart 08/04/2010   CAD (coronary artery disease), native coronary artery 08/04/2010    ONSET DATE: 05/01/2024  REFERRING DIAG: R27.0 (ICD-10-CM) - Ataxia, unspecified  THERAPY DIAG:   Unsteadiness on feet  Abnormal posture  Rationale for Evaluation and Treatment: Rehabilitation  SUBJECTIVE:  SUBJECTIVE STATEMENT: No falls, no further stumbles recently.  He is having no pain.    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 No  There were no vitals filed for this visit.  PRECAUTIONS: Fall  FALLS: Has patient fallen in last 6 months? No  LIVING ENVIRONMENT: Lives with: lives with his son  Lives in: House/apartment Stairs: no stairs, one level  Has following equipment at home: Single point cane, Walker - 2 wheeled, and does not use them   PLOF: Independent Son helps with yardwork, just driving in town   PATIENT GOALS: Wants to see what can be worked on for his balance. Improve his posture and mobility in general   OBJECTIVE:  Note: Objective measures were completed at Evaluation unless otherwise noted.  DIAGNOSTIC FINDINGS: MRI brain 12/2021:  IMPRESSION:  1. Mild enhancement in the right orbital fat is stable but with less T2 signal, compatible with prior radiation therapy. 2. Encephalomalacia at the site of the previous left cerebellar mass. No evidence of residual or recurrent mass. No abnormal enhancement. 3. Stable small vessel disease changes. Unenhancing subcortical T2 hyperintensity in the right frontal lobe is nonspecific but stable. Attention on follow-up. 4. Probable tiny developmental vascular anomaly and right midbrain is stable. No other significant interval changes.  MRI brain  07/10/2022:   IMPRESSION: 1. Stable postsurgical changes left posterior fossa 2. Stable enhancement right orbit 3. Stable foci abnormal white matter signal elsewhere likely small vessel disease 4. 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   COGNITION: Overall cognitive status: Within functional limits for tasks assessed   SENSATION: WFL  COORDINATION: RAM: slight dysmetria with LLE  Heel to shin: WNL    POSTURE: rounded shoulders, forward head, increased thoracic kyphosis, and flexed trunk   LOWER EXTREMITY ROM:    Slightly decr knee extension AROM   LOWER EXTREMITY MMT:    MMT Right Eval Left Eval  Hip flexion 5 5  Hip extension    Hip abduction    Hip adduction    Hip internal rotation    Hip external rotation    Knee flexion 5 5  Knee extension 5 5  Ankle dorsiflexion 5 5  Ankle plantarflexion    Ankle inversion    Ankle eversion    (Blank rows = not tested)   TRANSFERS: Sit to stand: Modified independence  Assistive device utilized: None     Stand to sit: Modified independence  Assistive device utilized: None      GAIT: Findings: Gait Characteristics: step through pattern, decreased stride length, decreased trunk rotation, and trunk flexed, Distance walked: Clinic distances, Assistive device utilized:None, Level of assistance: SBA, and Comments: pt reporting feeling more off balance when turning quickly or having to change directions   FUNCTIONAL TESTS:  5 times sit to stand: 9.4 seconds with no UE support  10 meter walk test: 12.9 seconds with no AD = 2.54 ft/sec    M-CTSIB  Condition 1: Firm Surface, EO 30 Sec, Normal Sway  Condition 2: Firm Surface, EC 30 Sec, Normal Sway  Condition 3: Foam Surface, EO 30 Sec, Normal Sway  Condition 4: Foam Surface, EC 30 Sec, Mild Sway  TREATMENT DATE: 07/30/24 3x30 ft vertical  ball toss and walking CGA, pt is significantly limited by kyphotic posture 3x20 ft left and right rotational tosses w/ walking CGA, pt has good improvement w/ repetition Squigz placement and retrieval from firm surface CGA > squigz placement and retrieval from airex CGA, pt anxious but without LOB Crossbody alternating 8 toe taps to cone x20 > on foam ipsilateral 8 cone taps x10 alt LE repeated x10 crossbody taps on foam beam  PATIENT EDUCATION: Education details: Continue HEP.  Goal assessment planned for next session and we will further discuss re-cert vs D/C.  Discussed not chasing things that get away from him especially not in a bent over position due to fall risk related to fixed kyphotic posture. Person educated: Patient and Child(ren) Education method: Medical illustrator Education comprehension: verbalized understanding, returned demonstration, and needs further education  HOME EXERCISE PROGRAM:  Access Code: [email or web address] URL: [email or web address] Date: 07/02/2024 Prepared by: Sheffield Senate  Exercises - Standing Balance with Eyes Closed on Foam  - 1-2 x daily - 5 x weekly - 3 sets - 30 hold - Romberg Stance on Foam Pad with Head Rotation  - 1-2 x daily - 5 x weekly - 2 sets - 10 reps - Tandem Walking with Counter Support  - 1-2 x daily - 5 x weekly - 3 sets - Standing Marching  - 1-2 x daily - 5 x weekly - 3 sets - Walking with Head Rotation  - 1-2 x daily - 5 x weekly - 3 sets - Scapular retraction with resistance  - 1 x daily - 5 x weekly - 2 sets - 10 reps - Seated Shoulder Diagonal with Resistance  - 1 x daily - 5 x weekly - 1-2 sets - 10 reps  GOALS: Goals reviewed with patient? Yes  SHORT TERM GOALS: Target date: 07/16/2024  Pt will be independent with initial HEP for posture, gait, balance in order to build upon functional gains made in therapy. Baseline:  IND and Compliant (7/23) Goal status: MET  2.  Pt will improve FGA to at least a 20/30  in order to demo decr fall risk. Baseline: 17/30; 17/30 (7/23) Goal status: NOT MET  3.  Pt will improve gait speed with no AD vs. LRAD to at least 2.8 ft/sec in order to demo improved community mobility.  Baseline: 12.9 seconds with no AD = 2.54 ft/sec; 2.99 ft/sec no AD SBA (7/23) Goal status: MET  4.  ABC scale to be assessed with LTG written.   Baseline:63.75% confident  Goal status: MET   LONG TERM GOALS: Target date: 08/13/2024  Pt will be independent with final HEP for posture, gait, balance in order to build upon functional gains made in therapy. Baseline:  Goal status: INITIAL  2.  Pt will improve FGA to at least a 20/30 in order to demo decr fall risk. Baseline: 17/30 Goal status: REVISED  3.  Pt will ambulate at least 500' over outdoor unlevel surfaces with LRAD with mod I in order to demo improved community mobility.  Baseline:  Goal status: INITIAL  4.  Pt will improve ABC to at least 70% in order to demo improved confidence with balance.  Baseline: 63.75% confident  Goal status: INITIAL    ASSESSMENT:  CLINICAL IMPRESSION: Focus of skilled session today on continuing to address high level balance with floor reaching tasks, head movements, and SLS.  His posture creates some functional limitation as it requires he shift posteriorly to peer  and reach overhead which creates significant instability.  He continues to benefit from skilled PT to improve limits of stability and righting reactions to compensate for postural impacts.  He is due for LTG assessment next session with likely re-cert to address these ongoing goals.  Continue per POC.    OBJECTIVE IMPAIRMENTS: Abnormal gait, decreased activity tolerance, decreased balance, decreased coordination, decreased endurance, difficulty walking, impaired flexibility, and postural dysfunction.   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   REHAB POTENTIAL: Good  CLINICAL DECISION MAKING: Stable/uncomplicated  EVALUATION COMPLEXITY: Low  PLAN:  PT FREQUENCY: 1x/week  PT DURATION: 8 weeks  PLANNED INTERVENTIONS: 97164- PT Re-evaluation, 97110-Therapeutic exercises, 97530- Therapeutic activity, 97112- Neuromuscular re-education, 97535- Self Care, [id number]- Manual therapy, [phone]- Gait training, Patient/Family education, Balance training, Stair training, Vestibular training, and DME instructions  PLAN FOR NEXT SESSION: EC, narrow BOS, SLS, head motions vertically and horizonatally - ball toss vs step and toss laterally. Work on resisted gait. Trial a cane for unlevel surfaces outdoors?  Squig pulls from floor and mirror - add blue mat.  Blaze pods w/ floor reach?  ASSESS LTGs - re-cert?   Daved KATHEE Bull, PT, DPT 07/30/2024, 11:54 AM

## 2024-08-04 NOTE — Progress Notes (Signed)
 Cardiology Office Note:    Date:  08/06/2024   ID:  Jack James, DOB July 25, 1934, MRN [id number]  PCP:  Windy Coy, MD  Cardiologist:  Redell Leiter, MD    Referring MD: Windy Coy, MD    ASSESSMENT:    1. Coronary artery disease of native artery of native heart with stable angina pectoris (HCC)   2. S/P CABG x 3   3. S/P mitral valve repair + CABG x3   4. Mixed hyperlipidemia   5. Sinus bradycardia    PLAN:    In order of problems listed above:  Jack James continues to do well with his cardiac problems he has had a good Jack durable result with CAD CABG and mitral valve repair having no cardiovascular symptoms near card is CHF class I will continue his Jack medical therapy including single antiplatelet clopidogrel  his high intensity statin and a very low-dose of a selective beta-blocker.  He is not having symptomatic bradycardia  continue his high intensity statin upcoming labs PCP office Stable bradycardia tolerates at minimum dose of selective beta-blocker   Next appointment: I will see him in 9 months   Medication Adjustments/Labs and Tests Ordered: Current medicines are reviewed at length with the patient today.  Concerns regarding medicines are outlined above.  Orders Placed This Encounter  Procedures   EKG 12-Lead  So yes after MI [place] was getting Darina yesterday No orders of the defined types were placed in this encounter.    History of Present Illness:    Jack James is a 89 y.o. 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  Last year his  echocardiogram performed showing the left ventricle to have a normal ejection fraction reduced GLS normal right ventricular size function and  pulmonary artery pressure.  He had a good result from his mitral valve repair in 2016.  He has a seemingly like to say is not a problem until the day he already had 158 things that were really good that happened from May  Recent labs at [facility] 07/10/2024 hemoglobin normal 12.4 platelets also normal at 172 serum sodium mildly diminished 131 potassium 4.2  An MRI of the orbits with and without contrast at [facility] 07/08/2024 which showed no findings of recurrent disease and stable posttreatment changes in the left cerebellum.  Compliance with diet, lifestyle and medications: Yes  Owyn is here with his son he is doing better he is engaged in physical therapy stronger his son is concerned about unsteady gait and we both encouraged him to use a cane fortunately he has not fallen today He has a little bit of pedal edema no longer uses salt tablets he is fluid restricting and is not having cardiovascular symptoms of edema shortness of breath chest pain palpitation or syncope Yellow full labs including lipids in his PCP office next month Past Medical History:  Diagnosis Date   Acute on chronic diastolic heart failure (HCC) 11/19/2015   Ataxia 03/12/2024   Atrial fibrillation (HCC) 01/20/2016   postoperative    Atrial fibrillation and flutter (HCC) 01/20/2016   postoperative   Bilateral sensorineural hearing loss 09/19/2023   CAD (coronary artery disease), native coronary artery 08/04/2010   Cath 2011 with severe proximal LAD stenosis treated  with 2.75 x 24 mm ion stent postdilated to 3.0 mm by Dr. Wonda  Catheterization 2016 shows 40% ostial LAD, patent stent, 50% mid LAD, 60% circumflex, and 40% RCA stenosis with severe mitral regurgitation.    Chest pain 05/02/2019   Chronic diastolic CHF (congestive heart failure) (HCC)    Coronary artery disease    a. 2011: DES to proximal LAD   Coronary artery disease involving coronary bypass graft of native heart 08/04/2010   Cath 2011 with severe proximal LAD  stenosis treated with 2.75 x 24 mm ion stent postdilated to 3.0 mm by Dr. Wonda  Catheterization 2016 shows 40% ostial LAD, patent stent, 50% mid LAD, 60% circumflex, and 40% RCA stenosis with severe mitral regurgitation.   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   Other constipation 05/28/2020   Pleural effusion, bilateral 11/18/2015   S/P CABG x 3 11/25/2015   LIMA to LAD, SVG to RCA, SVG to OM2, EVH via right thigh    S/P mitral valve repair 11/25/2015   Complex valvuloplasty including quadrangular resection of posterior leaflet, artificial Gore-tex neochord placement x10 and 32 mm Sorin Memo 3D Rechord ring annuloplasty   Skin cancer    Stroke (HCC)    Unstable angina (HCC) 11/19/2015    Current Medications: Current Meds  Medication Sig   acebutolol  (SECTRAL ) 200 MG capsule TAKE 1 CAPSULE BY MOUTH EVERY  OTHER DAY   acetaminophen  (TYLENOL ) 500 MG tablet Take 500 mg by mouth daily as needed for pain.   amoxicillin (AMOXIL) 500 MG capsule Take 2,000 mg by mouth See admin instructions. Take 2000 mg 1 hour prior to dental work   atorvastatin  (LIPITOR) 20 MG tablet Take 20 mg by mouth daily.   calcium  carbonate (TUMS EX) 750 MG chewable tablet Chew 1,500 mg by mouth 3 (three) times daily as needed for indigestion.   cholecalciferol  (VITAMIN D ) 1000 UNITS tablet Take 1,000 Units by mouth 3 (three) times daily.   clopidogrel  (PLAVIX ) 75 MG tablet Take 75 mg by mouth daily.     Cyanocobalamin (B-12 PO) Place 1 tablet under the tongue daily.   famotidine  (PEPCID ) 10 MG tablet Take 10 mg by mouth as needed for heartburn or indigestion.   fluocinonide cream (LIDEX) 0.05 % Apply 1 Application topically 2 (two) times daily.   isosorbide  mononitrate (IMDUR ) 30 MG 24 hr tablet TAKE 1 TABLET BY MOUTH DAILY   nitroGLYCERIN  (NITROSTAT ) 0.4 MG SL tablet Take 1 tablet 10-15 minutes before walking in the morning. (Patient taking differently: Place 0.4 mg under the tongue every 5 (five) minutes as needed for chest pain.)   polyethylene glycol powder (GLYCOLAX/MIRALAX) 17 GM/SCOOP powder Take 17 g by mouth as needed for constipation.   ranolazine  (RANEXA ) 500 MG 12 hr tablet TAKE 1 TABLET BY MOUTH TWICE  DAILY   SENEXON-S 8.6-50 MG tablet Take 2 tablets by mouth 2 (two) times daily as needed for constipation.   triamcinolone  cream (KENALOG ) 0.1 % Apply 1 application topically in the  morning and at bedtime.      EKGs/Labs/Other Studies Reviewed:    The following studies were reviewed today:  Cardiac Studies & Procedures   ______________________________________________________________________________________________ CARDIAC CATHETERIZATION  CARDIAC CATHETERIZATION 05/02/2019  Conclusion  Total occlusion of the mid RCA.  Widely patent left main.  Patent LAD with diffuse 30% in-stent restenosis in the proximal to mid vessel.  Beyond the stent there is a region of diffuse disease in the 50 to 70% range.  The insertion of the LIMA and the distal vessel demonstrates competitive flow with an atretic appearing LIMA.  60% proximal circumflex with competitive flow due to a widely patent saphenous vein graft  Widely patent but aneurysmal saphenous vein graft to the distal right coronary.  Aneurysm proximally and in the mid vessel are at sites of valves.  No obvious thrombus is noted.  Widely patent saphenous vein graft to the second obtuse marginal.  Patent but diffusely atretic  LIMA to LAD with competitive flow.  Recommendations:   Consider alternative explanations for the patient's dyspnea and chest tightness.  The chest tightness seems to be worse with activity but is also present at rest and a milder degree.  Dyspnea on exertion seems unlikely related to left ventricular diastolic heart failure.  I do note mild mitral stenosis was noted on echo.  Perhaps at higher heart rates mitral stenosis could be contributing to dyspnea.  If angina continues to be a concern, consider Lexiscan  Myoview  looking for apical ischemia.  If noted, stent of the native LAD beyond the previously placed stent might be helpful, but based upon angiography today it did not seem to be needed.  Findings Coronary Findings Diagnostic  Dominance: Right  Left Anterior Descending Ost LAD to Prox LAD lesion is 30% stenosed. The lesion was previously treated. Prox LAD lesion is 70% stenosed.  Left Circumflex Vessel is large. Ost Cx to Prox Cx lesion is 60% stenosed.  First Obtuse Marginal Branch Vessel is large in size.  Second Obtuse Marginal Branch Vessel is large in size.  Right Coronary Artery Mid RCA lesion is 100% stenosed.  LIMA Graft To Mid LAD and is small. Origin to Dist Graft lesion is 70% stenosed.  Graft To 2nd Mrg  Graft To Dist RCA  Intervention  No interventions have been documented.   CARDIAC CATHETERIZATION  CARDIAC CATHETERIZATION 11/19/2015  Conclusion 1. Triple vessel CAD 2. Patent stent mid LAD with minimal restenosis. 3. Moderate stenosis proximal Circumflex that does not appear to be flow limiting. 4. Moderate stenosis mid LAD beyond the old stented segment 5. Mild to Moderate stenosis mid RCA 6. 3+ Mitral regurgitation 7. Normal LV systolic function  Recommendations: His clinical presentation includes acute CHF with pleuritic chest pain and dyspnea. There are no severe flow limiting lesions noted on cath although he does have moderate CAD. He  is also noted to have 3+ MR on cath. Echo is pending. At this time, would follow results of echo and continue diuresis with IV Lasix . Medical management of CAD at this time but may need revascularization if mitral valve is repaired/replaced.  Findings Coronary Findings Diagnostic  Dominance: Right  Left Anterior Descending Calcified diffuse. Diffuse. The lesion was previously treated with a drug-eluting stent greater than two years ago. Diffuse.  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  Second Diagonal Branch The vessel is small in size.  Left Circumflex . Vessel is large. Discrete.  First Obtuse Marginal Branch The vessel is moderate in size.  Second Obtuse Marginal Branch The vessel  is moderate in size.  Right Coronary Artery . Vessel is large. Calcified diffuse. Calcified diffuse.  Right Posterior Descending Artery The vessel is small in size.  Intervention  No interventions have been documented.   STRESS TESTS  MYOCARDIAL PERFUSION IMAGING 05/15/2019  Interpretation Summary  The left ventricular ejection fraction is normal (55-65%).  Nuclear stress EF: 60%.  Blood pressure demonstrated a normal response to exercise.  There was no ST segment deviation noted during stress.  This is a low risk study.  No ischemia or scan noted.  Normal EF.  Reverse redistribution involving inferior wall of unknown clinical significance.   ECHOCARDIOGRAM  ECHOCARDIOGRAM COMPLETE 09/12/2023  Narrative ECHOCARDIOGRAM REPORT    Patient Name:   Jack James Date of Exam: 09/12/2023 Medical Rec #:  [id number]       Height:       71.0 in Accession #:    [id number]      Weight:       157.8 lb Date of Birth:  Apr 26, 1934      BSA:          1.907 m Patient Age:    88 years        BP:           118/60 mmHg Patient Gender: M               HR:           60 bpm. Exam Location:  [facility]  Procedure: 2D Echo, Cardiac Doppler, Color Doppler and Strain Analysis  Indications:     Coronary artery disease of native artery of native heart with stable angina pectoris (HCC) [I25.118 (ICD-10-CM)]; S/P mitral valve repair + CABG x3 [S01.109 (ICD-10-CM)]; Mixed hyperlipidemia [E78.2 (ICD-10-CM)]  History:        Patient has prior history of Echocardiogram examinations, most recent 05/02/2019. CAD, Prior CABG, Mitral Valve Disease; Risk Factors:Dyslipidemia. S/P mitral valve repair.  Mitral Valve: prosthetic annuloplasty ring valve is present in the mitral position. Procedure Date: 11/25/2015.  Sonographer:    Charlie Jointer RDCS Referring Phys: [id number] Anniebell Bedore J Raylon Lamson  IMPRESSIONS   1. Left ventricular ejection fraction, by estimation, is 55 to 60%. The left ventricle has normal function. The left ventricle has no regional wall motion abnormalities. Left ventricular diastolic parameters are consistent with Grade II diastolic dysfunction (pseudonormalization). The average left ventricular global longitudinal strain is -13.7 %. The global longitudinal strain is abnormal. 2. Right ventricular systolic function is normal. The right ventricular size is normal. There is normal pulmonary artery systolic pressure. 3. The mitral valve is normal in structure. Trivial mitral valve regurgitation. Mild mitral stenosis. The mean mitral valve gradient is 2.0 mmHg. There is a prosthetic annuloplasty ring present in the mitral position. Procedure Date: 11/25/2015. 4. The aortic valve is normal in structure. Aortic valve regurgitation is mild. No aortic stenosis is present. 5. The inferior vena cava is normal in size with greater than 50% respiratory variability, suggesting right atrial pressure of 3 mmHg.  FINDINGS Left Ventricle: Left ventricular ejection fraction, by estimation, is 55 to 60%. The left ventricle has normal function. The left ventricle has no regional wall motion abnormalities. The average left ventricular global longitudinal strain is -13.7 %. The global longitudinal strain  is abnormal. The left ventricular internal cavity size was normal in size. There is no left ventricular hypertrophy. Left ventricular diastolic parameters are consistent with Grade II diastolic dysfunction (pseudonormalization).  Right Ventricle: The right ventricular size is normal. No  increase in right ventricular wall thickness. Right ventricular systolic function is normal. There is normal pulmonary artery systolic pressure. The tricuspid regurgitant velocity is 2.28 m/s, and with an assumed right atrial pressure of 3 mmHg, the estimated right ventricular systolic pressure is 23.8 mmHg.  Left Atrium: Left atrial size was normal in size.  Right Atrium: Right atrial size was normal in size.  Pericardium: There is no evidence of pericardial effusion.  Mitral Valve: The mitral valve is normal in structure. There is moderate thickening of the mitral valve leaflet(s). Trivial mitral valve regurgitation. There is a prosthetic annuloplasty ring present in the mitral position. Procedure Date: 11/25/2015. Mild mitral valve stenosis. MV peak gradient, 5.3 mmHg. The mean mitral valve gradient is 2.0 mmHg.  Tricuspid Valve: The tricuspid valve is normal in structure. Tricuspid valve regurgitation is mild . No evidence of tricuspid stenosis.  Aortic Valve: The aortic valve is normal in structure. Aortic valve regurgitation is mild. Aortic regurgitation PHT measures 705 msec. No aortic stenosis is present. Aortic valve mean gradient measures 2.0 mmHg. Aortic valve peak gradient measures 4.5 mmHg. Aortic valve area, by VTI measures 1.64 cm.  Pulmonic Valve: The pulmonic valve was normal in structure. Pulmonic valve regurgitation is not visualized. No evidence of pulmonic stenosis.  Aorta: The aortic root is normal in size and structure.  Venous: The inferior vena cava is normal in size with greater than 50% respiratory variability, suggesting right atrial pressure of 3 mmHg.  IAS/Shunts: No atrial  level shunt detected by color flow Doppler.   LEFT VENTRICLE PLAX 2D LVIDd:         4.60 cm   Diastology LVIDs:         3.40 cm   LV e' medial:    7.07 cm/s LV PW:         0.80 cm   LV E/e' medial:  20.5 LV IVS:        0.80 cm   LV e' lateral:   8.70 cm/s LVOT diam:     1.80 cm   LV E/e' lateral: 16.7 LV SV:         43 LV SV Index:   22        2D Longitudinal Strain LVOT Area:     2.54 cm  2D Strain GLS Avg:     -13.7 %   RIGHT VENTRICLE             IVC RV Basal diam:  3.60 cm     IVC diam: 1.40 cm RV Mid diam:    3.00 cm RV S prime:     10.00 cm/s TAPSE (M-mode): 1.4 cm  LEFT ATRIUM           Index        RIGHT ATRIUM           Index LA diam:      3.40 cm 1.78 cm/m   RA Area:     17.10 cm LA Vol (A2C): 75.6 ml 39.65 ml/m  RA Volume:   44.00 ml  23.08 ml/m LA Vol (A4C): 39.7 ml 20.82 ml/m AORTIC VALVE AV Area (Vmax):    1.68 cm AV Area (Vmean):   1.65 cm AV Area (VTI):     1.64 cm AV Vmax:           106.00 cm/s AV Vmean:          72.900 cm/s AV VTI:  0.261 m AV Peak Grad:      4.5 mmHg AV Mean Grad:      2.0 mmHg LVOT Vmax:         69.85 cm/s LVOT Vmean:        47.150 cm/s LVOT VTI:          0.168 m LVOT/AV VTI ratio: 0.64 AI PHT:            705 msec  AORTA Ao Root diam: 3.00 cm Ao Asc diam:  2.20 cm Ao Desc diam: 2.20 cm  MITRAL VALVE                TRICUSPID VALVE MV Area (PHT): 2.12 cm     TR Peak grad:   20.8 mmHg MV Peak grad:  5.3 mmHg     TR Vmax:        228.00 cm/s MV Mean grad:  2.0 mmHg MV Vmax:       1.15 m/s     SHUNTS MV Vmean:      73.9 cm/s    Systemic VTI:  0.17 m MV Decel Time: 357 msec     Systemic Diam: 1.80 cm MV E velocity: 145.00 cm/s MV A velocity: 85.80 cm/s MV E/A ratio:  1.69  Lamar Fitch MD Electronically signed by Lamar Fitch MD Signature Date/Time: 09/12/2023/11:49:25 AM    Final   TEE  ECHO TEE 11/22/2015  Narrative *[facility]* *[facility]* [address] [place], [place] [address] [phone]  ------------------------------------------------------------------- Transesophageal Echocardiography  Patient:    Khamauri, Bauernfeind MR #:       [id number] Study Date: 11/22/2015 Gender:     M Age:        57 Height:     177.8 cm Weight:     72.3 kg BSA:        1.89 m^2 Pt. Status: Room:       [id number]  TISA Maude Emmer, M.D. REFERRING    Maude Emmer, M.D. ADMITTING    Ozell Fell, MD PERFORMING   Vinie Maxcy MD ATTENDING    Dreama Longs SONOGRAPHER  Annabella Fell, RDCS  cc:  ------------------------------------------------------------------- LV EF: 60% -   65%  ------------------------------------------------------------------- Indications:      Mitral regurgitation 424.0.  ------------------------------------------------------------------- History:   PMH:  Mitral Valve Prolapse. EKG Change.  Coronary artery disease.  PMH:  Chronic Anemia.  Risk factors: Dyslipidemia.  ------------------------------------------------------------------- Study Conclusions  - Left ventricle: The cavity size was normal. There was mild concentric hypertrophy. Systolic function was normal. The estimated ejection fraction was in the range of 60% to 65%. Wall motion was normal; there were no regional wall motion abnormalities. - Aortic valve: No evidence of vegetation. - Mitral valve: Myxomatous degeneration of the valve with thickening of the AMVL and mild prolapse. There is a flail posterior chord and P2 (pan leaflet prolapse) with severe centro-posteriorly directed mitral regurgitation. - Left atrium: The atrium was dilated. No evidence of thrombus in the atrial cavity or appendage. - Right atrium: The atrium was dilated. - Atrial septum: No defect or patent foramen ovale was identified. - Pulmonic valve: No evidence of vegetation.  Impressions:  - Myxomatous degeneration of the mitral valve with AMVL thickening and a flail  PMVL chord with pan-leaflet prolpase and severe centro-posteriorly directed regurgitation.  Diagnostic transesophageal echocardiography.  2D and color Doppler. Birthdate:  Patient birthdate: 06/22/1934.  Age:  Patient is 89 yr old.  Sex:  Gender:  male.    BMI: 22.9 kg/m^2.  Blood pressure: 121/62  Patient status:  Inpatient.  Study date:  Study date: 11/22/2015. Study time: 10:48 AM.  Location:  Endoscopy.  -------------------------------------------------------------------  ------------------------------------------------------------------- Left ventricle:  The cavity size was normal. There was mild concentric hypertrophy. Systolic function was normal. The estimated ejection fraction was in the range of 60% to 65%. Wall motion was normal; there were no regional wall motion abnormalities.  ------------------------------------------------------------------- Aortic valve:   Structurally normal valve. Trileaflet. Cusp separation was normal.  No evidence of vegetation.  Doppler:  There was no regurgitation.  ------------------------------------------------------------------- Aorta:  The aorta was normal, not dilated, and non-diseased.  ------------------------------------------------------------------- Mitral valve:  Myxomatous degeneration of the valve with thickening of the AMVL and mild prolapse. There is a flail posterior chord and P2 (pan leaflet prolapse) with severe centro-posteriorly directed mitral regurgitation.  ------------------------------------------------------------------- Left atrium:  The atrium was dilated.  No evidence of thrombus in the atrial cavity or appendage.  ------------------------------------------------------------------- Atrial septum:  No defect or patent foramen ovale was identified.  ------------------------------------------------------------------- Right ventricle:  The cavity size was normal. Wall thickness was normal. Systolic function was  normal.  ------------------------------------------------------------------- Pulmonic valve:    Structurally normal valve.   Cusp separation was normal.  No evidence of vegetation.  ------------------------------------------------------------------- Tricuspid valve:   Doppler:  There was mild regurgitation.  ------------------------------------------------------------------- Right atrium:  The atrium was dilated.  ------------------------------------------------------------------- Pericardium:  There was no pericardial effusion.  ------------------------------------------------------------------- Post procedure conclusions Ascending Aorta:  - The aorta was normal, not dilated, and non-diseased.  ------------------------------------------------------------------- Measurements  Mitral valve                    Value Mitral regurg VTI, PISA         130   cm Mitral ERO, PISA                0.88  cm^2 Mitral regurg volume, PISA      114   ml  Legend: (L)  and  (H)  mark values outside specified reference range.  ------------------------------------------------------------------- Prepared and Electronically Authenticated by  Vinie Maxcy MD 2016-11-28T17:33:44  MONITORS  LONG TERM MONITOR (3-14 DAYS) 01/08/2019  Narrative A ZIO monitor was performed for 14 days beginning 01/08/2019.  Study was performed to evaluate arrhythmia in the setting of stroke.  The rhythm throughout is sinus minimum average and maximum heart rates of 4971 and 117 bpm.  There was one triggered event associated with sinus rhythm there were no symptomatic events.  Ventricular ectopy is rare with isolated PVCs couplet and triplet.  Supraventricular ectopy is rare there are no episodes of atrial fibrillation or flutter.  There are no bradycardic events of pauses greater than 3 seconds or high-grade AV or sinus block.   Conclusion unremarkable 14-day extended monitor with no atrial fibrillation  identified as etiology of stroke       ______________________________________________________________________________________________      EKG Interpretation Date/Time:  Wednesday August 06 2024 08:09:03 EDT Ventricular Rate:  69 PR Interval:  240 QRS Duration:  90 QT Interval:  412 QTC Calculation: 441 R Axis:   54  Text Interpretation: Sinus rhythm with sinus arrhythmia with 1st degree A-V block When compared with ECG of 08-Aug-2023 07:59, No significant change was found Confirmed by Monetta Rogue ([id number]) on 08/06/2024 8:10:39 AM   Recent Labs: 02/20/2024: BUN 20; Creatinine, Ser 1.01; Potassium 4.6; Sodium 133  Recent Lipid Panel    Component Value Date/Time  CHOL 131 10/21/2019 1434   TRIG 47 10/21/2019 1434   HDL 75 10/21/2019 1434   CHOLHDL 1.7 10/21/2019 1434   CHOLHDL 2.1 11/20/2015 0030   VLDL 7 11/20/2015 0030   LDLCALC 45 10/21/2019 1434    Physical Exam:    VS:  BP 110/64   Pulse 68   Ht 5' 10 (1.778 m)   Wt 154 lb 6.4 oz (70 kg)   SpO2 99%   BMI 22.15 kg/m     Wt Readings from Last 3 Encounters:  08/06/24 154 lb 6.4 oz (70 kg)  02/20/24 157 lb 12.8 oz (71.6 kg)  08/08/23 157 lb 12.8 oz (71.6 kg)     GEN: Appears his age well nourished, well developed in no acute distress HEENT: Normal NECK: No JVD; No carotid bruits LYMPHATICS: No lymphadenopathy CARDIAC: RRR, no murmurs, rubs, gallops RESPIRATORY:  Clear to auscultation without rales, wheezing or rhonchi  ABDOMEN: Soft, non-tender, non-distended MUSCULOSKELETAL:  No edema; No deformity  SKIN: Warm and dry NEUROLOGIC:  Alert and oriented x 3 PSYCHIATRIC:  Normal affect    Signed, Redell Leiter, MD  08/06/2024 8:10 AM    [facility]

## 2024-08-06 ENCOUNTER — Encounter: Payer: Self-pay | Admitting: Cardiology

## 2024-08-06 ENCOUNTER — Ambulatory Visit: Attending: Cardiology | Admitting: Cardiology

## 2024-08-06 ENCOUNTER — Encounter: Payer: Self-pay | Admitting: Physical Therapy

## 2024-08-06 ENCOUNTER — Ambulatory Visit: Admitting: Physical Therapy

## 2024-08-06 VITALS — BP 110/64 | HR 68 | Ht 70.0 in | Wt 154.4 lb

## 2024-08-06 DIAGNOSIS — I25118 Atherosclerotic heart disease of native coronary artery with other forms of angina pectoris: Secondary | ICD-10-CM

## 2024-08-06 DIAGNOSIS — R2681 Unsteadiness on feet: Secondary | ICD-10-CM | POA: Diagnosis not present

## 2024-08-06 DIAGNOSIS — Z951 Presence of aortocoronary bypass graft: Secondary | ICD-10-CM | POA: Diagnosis not present

## 2024-08-06 DIAGNOSIS — R001 Bradycardia, unspecified: Secondary | ICD-10-CM

## 2024-08-06 DIAGNOSIS — Z9889 Other specified postprocedural states: Secondary | ICD-10-CM | POA: Diagnosis not present

## 2024-08-06 DIAGNOSIS — R293 Abnormal posture: Secondary | ICD-10-CM

## 2024-08-06 DIAGNOSIS — E782 Mixed hyperlipidemia: Secondary | ICD-10-CM

## 2024-08-06 NOTE — Patient Instructions (Signed)
 Medication Instructions:  Your physician recommends that you continue on your current medications as directed. Please refer to the Current Medication list given to you today.  *If you need a refill on your cardiac medications before your next appointment, please call your pharmacy*  Lab Work: None If you have labs (blood work) drawn today and your tests are completely normal, you will receive your results only by: MyChart Message (if you have MyChart) OR A paper copy in the mail If you have any lab test that is abnormal or we need to change your treatment, we will call you to review the results.  Testing/Procedures: None  Follow-Up: At [facility], you and your health needs are our priority.  As part of our continuing mission to provide you with exceptional heart care, our providers are all part of one team.  This team includes your primary Cardiologist (physician) and Advanced Practice Providers or APPs (Physician Assistants and Nurse Practitioners) who all work together to provide you with the care you need, when you need it.  Your next appointment:   9 month(s)  Provider:   Zoe Hinds, MD    We recommend signing up for the patient portal called "MyChart".  Sign up information is provided on this After Visit Summary.  MyChart is used to connect with patients for Virtual Visits (Telemedicine).  Patients are able to view lab/test results, encounter notes, upcoming appointments, etc.  Non-urgent messages can be sent to your provider as well.   To learn more about what you can do with MyChart, go to [email or web address].   Other Instructions None

## 2024-08-06 NOTE — Therapy (Signed)
 OUTPATIENT PHYSICAL THERAPY NEURO TREATMENT - RECERTIFICATION   Patient Name: Jack James MRN: [id number] DOB:01-04-1934, 89 y.o., male Today's Date: 08/06/2024   PCP: Windy Coy, MD REFERRING PROVIDER: Windy Coy, MD  END OF SESSION:  PT End of Session - 08/06/24 1030     Visit Number 7    Number of Visits 11   7 + 4   Date for PT Re-Evaluation 09/19/24   pushed out due to scheduling delay - pt unavailable x1 week in POC   Authorization Type [employer] MEDICARE    PT Start Time 1025    PT Stop Time 1113    PT Time Calculation (min) 48 min    Equipment Utilized During Treatment Gait belt    Activity Tolerance Patient tolerated treatment well    Behavior During Therapy [facility] for tasks assessed/performed          Past Medical History:  Diagnosis Date   Acute on chronic diastolic heart failure (HCC) 11/19/2015   Ataxia 03/12/2024   Atrial fibrillation (HCC) 01/20/2016   postoperative    Atrial fibrillation and flutter (HCC) 01/20/2016   postoperative   Bilateral sensorineural hearing loss 09/19/2023   CAD (coronary artery disease), native coronary artery 08/04/2010   Cath 2011 with severe proximal LAD stenosis treated with 2.75 x 24 mm ion stent postdilated to 3.0 mm by Dr. Wonda  Catheterization 2016 shows 40% ostial LAD, patent stent, 50% mid LAD, 60% circumflex, and 40% RCA stenosis with severe mitral regurgitation.    Chest pain 05/02/2019   Chronic diastolic CHF (congestive heart failure) (HCC)    Coronary artery disease    a. 2011: DES to proximal LAD   Coronary artery disease involving coronary bypass graft of native heart 08/04/2010   Cath 2011 with severe proximal LAD stenosis treated with 2.75 x 24 mm ion stent postdilated to 3.0 mm by Dr. Wonda  Catheterization 2016 shows 40% ostial LAD, patent stent, 50% mid LAD, 60% circumflex, and 40% RCA stenosis with severe mitral regurgitation.   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   Other constipation 05/28/2020   Pleural effusion, bilateral 11/18/2015   S/P CABG x 3 11/25/2015   LIMA to LAD, SVG to RCA, SVG to OM2, EVH via right thigh    S/P mitral valve repair 11/25/2015   Complex valvuloplasty including quadrangular resection of posterior leaflet, artificial Gore-tex neochord placement x10 and 32 mm Sorin Memo 3D Rechord ring annuloplasty   Skin cancer    Stroke (HCC)    Unstable angina (HCC) 11/19/2015   Past Surgical History:  Procedure Laterality Date   CARDIAC CATHETERIZATION N/A 11/19/2015   Procedure: Left Heart Cath and Coronary Angiography;  Surgeon: Lonni JONETTA Cash, MD;  Location[facility] INVASIVE CV LAB;  Service: Cardiovascular;  Laterality: N/A;   CATARACT EXTRACTION, BILATERAL  2017   CLIPPING OF ATRIAL APPENDAGE  11/25/2015   Procedure: CLIPPING OF ATRIAL APPENDAGE;  Surgeon: Sudie VEAR Laine, MD;  Location: [facility] OR;  Service: Open  Heart Surgery;;   CORONARY ARTERY BYPASS GRAFT N/A 11/25/2015   Procedure: CORONARY ARTERY BYPASS GRAFTING (CABG) TIMES THREE USING LEFT INTERNAL MAMMARY ARTERY AND RIGHT SAPHENOUS LEG VEIN HARVESTED ENDOSCOPICALLY;  Surgeon: Sudie VEAR Laine, MD;  Location: [facility] OR;  Service: Open Heart Surgery;  Laterality: N/A;   CORONARY STENT PLACEMENT  2011   DES to proximal LAD   LEFT HEART CATH AND CORS/GRAFTS ANGIOGRAPHY N/A 05/02/2019   Procedure: LEFT HEART CATH AND CORS/GRAFTS ANGIOGRAPHY;  Surgeon: Claudene Victory ORN, MD;  Location: [facility] INVASIVE  CV LAB;  Service: Cardiovascular;  Laterality: N/A;   LITHOTRIPSY     MITRAL VALVE REPAIR N/A 11/25/2015   Procedure: MITRAL VALVE REPAIR (MVR);  Surgeon: Sudie VEAR Laine, MD;  Location: [facility] OR;  Service: Open Heart Surgery;  Laterality: N/A;   SKIN CANCER EXCISION     TEE WITHOUT CARDIOVERSION N/A 11/22/2015   Procedure: TRANSESOPHAGEAL ECHOCARDIOGRAM (TEE);  Surgeon: Vinie JAYSON Maxcy, MD;  Location: [facility] ENDOSCOPY;  Service: Cardiovascular;  Laterality: N/A;   TEE WITHOUT CARDIOVERSION N/A 11/25/2015   Procedure: TRANSESOPHAGEAL ECHOCARDIOGRAM (TEE);  Surgeon: Sudie VEAR Laine, MD;  Location: [facility] OR;  Service: Open Heart Surgery;  Laterality: N/A;   Patient Active Problem List   Diagnosis Date Noted   Ataxia 03/12/2024   Bilateral sensorineural hearing loss 09/19/2023   Imbalance 09/19/2023   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   History of colonic polyps 04/19/2022   Coronary artery disease    HLD (hyperlipidemia)    Mitral valve prolapse    Skin cancer    Stroke [facility])    Headache disorder 08/18/2020   Gastroesophageal reflux disease without esophagitis 05/28/2020   Other constipation 05/28/2020   Increased MCV 04/28/2020   History of CVA (cerebrovascular accident) 01/19/2020   Encounter for antineoplastic chemotherapy 12/28/2019   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   Chest pain 05/02/2019   Atrial fibrillation and flutter (HCC) 01/20/2016   Atrial fibrillation (HCC) 01/20/2016   S/P mitral valve repair + CABG x3 11/25/2015   S/P CABG x 3 11/25/2015   Unstable angina (HCC) 11/19/2015   MVP (mitral valve prolapse) 11/19/2015   Chronic diastolic CHF (congestive heart failure) (HCC) 11/19/2015   Acute on chronic diastolic heart failure (HCC) 11/19/2015   Mitral regurgitation 11/19/2015   Hyperlipidemia    Pleural effusion, bilateral 11/18/2015   Coronary artery disease involving coronary bypass graft of native heart 08/04/2010   CAD (coronary artery disease), native coronary  artery 08/04/2010    ONSET DATE: 05/01/2024  REFERRING DIAG: R27.0 (ICD-10-CM) - Ataxia, unspecified  THERAPY DIAG:  Unsteadiness on feet - Plan: PT plan of care cert/re-cert  Abnormal posture - Plan: PT plan of care cert/re-cert  Rationale for Evaluation and Treatment: Rehabilitation  SUBJECTIVE:  SUBJECTIVE STATEMENT: No falls, no further stumbles recently.  He is having no pain.    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 No  There were no vitals filed for this visit.  PRECAUTIONS: Fall  FALLS: Has patient fallen in last 6 months? No  LIVING ENVIRONMENT: Lives with: lives with his son  Lives in: House/apartment Stairs: no stairs, one level  Has following equipment at home: Single point cane, Walker - 2 wheeled, and does not use them   PLOF: Independent Son helps with yardwork, just driving in town   PATIENT GOALS: Wants to see what can be worked on for his balance. Improve his posture and mobility in general   OBJECTIVE:  Note: Objective measures were completed at Evaluation unless otherwise noted.  DIAGNOSTIC FINDINGS: MRI brain 12/2021:  IMPRESSION:  1. Mild enhancement in the right orbital fat is stable but with less T2 signal, compatible with prior radiation therapy. 2. Encephalomalacia at the site of the previous left cerebellar mass. No evidence of residual or recurrent mass. No abnormal enhancement. 3. Stable small vessel disease changes. Unenhancing subcortical T2 hyperintensity in the right  frontal lobe is nonspecific but stable. Attention on follow-up. 4. Probable tiny developmental vascular anomaly and right midbrain is stable. No other significant interval changes.  MRI brain 07/10/2022:   IMPRESSION: 1. Stable postsurgical changes left posterior fossa 2. Stable enhancement right orbit 3. Stable foci abnormal white matter signal elsewhere likely small vessel disease 4. 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   COGNITION: Overall cognitive status: Within functional limits for tasks assessed   SENSATION: WFL  COORDINATION: RAM: slight dysmetria with LLE  Heel to shin: WNL    POSTURE: rounded shoulders, forward head, increased thoracic kyphosis, and flexed trunk   LOWER EXTREMITY ROM:    Slightly decr knee extension AROM   LOWER EXTREMITY MMT:    MMT Right Eval Left Eval  Hip flexion 5 5  Hip extension    Hip abduction    Hip adduction    Hip internal rotation    Hip external rotation    Knee flexion 5 5  Knee extension 5 5  Ankle dorsiflexion 5 5  Ankle plantarflexion    Ankle inversion    Ankle eversion    (Blank rows = not tested)   TRANSFERS: Sit to stand: Modified independence  Assistive device utilized: None     Stand to sit: Modified independence  Assistive device utilized: None      GAIT: Findings: Gait Characteristics: step through pattern, decreased stride length, decreased trunk rotation, and trunk flexed, Distance walked: Clinic distances, Assistive device utilized:None, Level of assistance: SBA, and Comments: pt reporting feeling more off balance when turning quickly or having to change directions   FUNCTIONAL TESTS:  5 times sit to stand: 9.4 seconds with no UE support  10 meter walk test: 12.9 seconds with no AD = 2.54 ft/sec    M-CTSIB  Condition 1: Firm Surface, EO 30 Sec, Normal Sway  Condition 2: Firm Surface, EC 30 Sec, Normal Sway  Condition 3: Foam Surface, EO 30 Sec, Normal Sway  Condition 4: Foam Surface,  EC 30 Sec, Mild Sway    [facility] PT Assessment - 08/06/24  1043       Functional Gait  Assessment   Gait assessed  Yes    Gait Level Surface Walks 20 ft in less than 7 sec but greater than 5.5 sec, uses assistive device, slower speed, mild gait deviations, or deviates 6-10 in outside of the 12 in walkway width.    Change in Gait Speed Able to smoothly change walking speed without loss of balance or gait deviation. Deviate no more than 6 in outside of the 12 in walkway width.    Gait with Horizontal Head Turns Performs head turns smoothly with no change in gait. Deviates no more than 6 in outside 12 in walkway width    Gait with Vertical Head Turns Performs head turns with no change in gait. Deviates no more than 6 in outside 12 in walkway width.    Gait and Pivot Turn Pivot turns safely in greater than 3 sec and stops with no loss of balance, or pivot turns safely within 3 sec and stops with mild imbalance, requires small steps to catch balance.    Step Over Obstacle Is able to step over 2 stacked shoe boxes taped together (9 in total height) without changing gait speed. No evidence of imbalance.    Gait with Narrow Base of Support Ambulates 4-7 steps.   4 consecutively   Gait with Eyes Closed Cannot walk 20 ft without assistance, severe gait deviations or imbalance, deviates greater than 15 in outside 12 in walkway width or will not attempt task.    Ambulating Backwards Walks 20 ft, uses assistive device, slower speed, mild gait deviations, deviates 6-10 in outside 12 in walkway width.    Steps Alternating feet, must use rail.    Total Score 21    FGA comment: 21/30 = moderate fall risk                                                                                                                                    TREATMENT DATE: 08/06/24 ABC Scale (of note son helps pt answers questions):  55.625% FGA:  [facility] PT Assessment - 08/06/24 1043       Functional Gait  Assessment   Gait  assessed  Yes    Gait Level Surface Walks 20 ft in less than 7 sec but greater than 5.5 sec, uses assistive device, slower speed, mild gait deviations, or deviates 6-10 in outside of the 12 in walkway width.    Change in Gait Speed Able to smoothly change walking speed without loss of balance or gait deviation. Deviate no more than 6 in outside of the 12 in walkway width.    Gait with Horizontal Head Turns Performs head turns smoothly with no change in gait. Deviates no more than 6 in outside 12 in walkway width    Gait with Vertical Head Turns Performs head turns with no change in gait. Deviates no more than 6 in  outside 12 in walkway width.    Gait and Pivot Turn Pivot turns safely in greater than 3 sec and stops with no loss of balance, or pivot turns safely within 3 sec and stops with mild imbalance, requires small steps to catch balance.    Step Over Obstacle Is able to step over 2 stacked shoe boxes taped together (9 in total height) without changing gait speed. No evidence of imbalance.    Gait with Narrow Base of Support Ambulates 4-7 steps.   4 consecutively   Gait with Eyes Closed Cannot walk 20 ft without assistance, severe gait deviations or imbalance, deviates greater than 15 in outside 12 in walkway width or will not attempt task.    Ambulating Backwards Walks 20 ft, uses assistive device, slower speed, mild gait deviations, deviates 6-10 in outside 12 in walkway width.    Steps Alternating feet, must use rail.    Total Score 21    FGA comment: 21/30 = moderate fall risk         RAMP:  Level of Assistance: SBA Assistive device utilized: Single point cane Ramp Comments: Some staggering on turn with cane on platform, good sequencing for ascent/descent.  CURB:  Level of Assistance: SBA Assistive device utilized: Single point cane Curb Comments: Pt demonstrates improved confidence with step up and good turning.  GAIT: Gait pattern: step through pattern, decreased arm swing-  Left, and trunk flexed Distance walked: 345 ft Assistive device utilized: Single point cane Level of assistance: Complete Independence Comments: Pt reluctant to use cane at first, return demo of sequencing options and he is able to carryover for duration of task.  Discussed carryover to outdoor access and safety and use of trekking pole vs cane per pt preference.  Limited to indoor track this visit due to rain.  PATIENT EDUCATION: Education details: Continue HEP.  Do not walk with hands behind back due to increased fall risk.  Process for recert today and scheduling additional visits.  Working with cane and trekking pole for improved outdoor management - pt unsure he will use it, but open to options. Person educated: Patient and Child(ren) Education method: Medical illustrator Education comprehension: verbalized understanding, returned demonstration, and needs further education  HOME EXERCISE PROGRAM:  Access Code: [email or web address] URL: [email or web address] Date: 07/02/2024 Prepared by: Sheffield Senate  Exercises - Standing Balance with Eyes Closed on Foam  - 1-2 x daily - 5 x weekly - 3 sets - 30 hold - Romberg Stance on Foam Pad with Head Rotation  - 1-2 x daily - 5 x weekly - 2 sets - 10 reps - Tandem Walking with Counter Support  - 1-2 x daily - 5 x weekly - 3 sets - Standing Marching  - 1-2 x daily - 5 x weekly - 3 sets - Walking with Head Rotation  - 1-2 x daily - 5 x weekly - 3 sets - Scapular retraction with resistance  - 1 x daily - 5 x weekly - 2 sets - 10 reps - Seated Shoulder Diagonal with Resistance  - 1 x daily - 5 x weekly - 1-2 sets - 10 reps  GOALS: Goals reviewed with patient? Yes  SHORT TERM GOALS: Target date: 07/16/2024  Pt will be independent with initial HEP for posture, gait, balance in order to build upon functional gains made in therapy. Baseline:  IND and Compliant (7/23) Goal status: MET  2.  Pt will improve FGA to at least a 20/30 in  order to demo  decr fall risk. Baseline: 17/30; 17/30 (7/23) Goal status: NOT MET  3.  Pt will improve gait speed with no AD vs. LRAD to at least 2.8 ft/sec in order to demo improved community mobility.  Baseline: 12.9 seconds with no AD = 2.54 ft/sec; 2.99 ft/sec no AD SBA (7/23) Goal status: MET  4.  ABC scale to be assessed with LTG written.   Baseline:63.75% confident  Goal status: MET   LONG TERM GOALS: Target date: 08/13/2024  Pt will be independent with final HEP for posture, gait, balance in order to build upon functional gains made in therapy. Baseline: IND and compliant, reports ongoing challenge w/ postural exercises, but balance portion needs advancement (8/13) Goal status: PARTIALLY MET  2.  Pt will improve FGA to at least a 20/30 in order to demo decr fall risk. Baseline: 17/30; 21/30 (8/13) Goal status: MET  3.  Pt will ambulate at least 500' over outdoor unlevel surfaces with LRAD with mod I in order to demo improved community mobility.  Baseline: 345' SBA SPC indoor ramp/curb/floor (8/13) Goal status: PARTIALLY MET  4.  Pt will improve ABC to at least 70% in order to demo improved confidence with balance.  Baseline: 63.75% confident; 55.625% (8/13) Goal status: NOT MET  GOALS (at 1/86 re-cert): Goals reviewed with patient? Yes  SHORT TERM GOALS = LONG TERM GOALS: Target date: 09/12/2024  Pt will be independent with advanced and finalized HEP for posture, gait, balance in order to build upon functional gains made in therapy. Baseline: IND and compliant, reports ongoing challenge w/ postural exercises, but balance portion needs advancement (8/13) Goal status: INITIAL  2.  Pt will improve FGA to at least a 25/30 in order to demo decr fall risk. Baseline: 17/30; 21/30 (8/13) Goal status: REVISED  3.  Pt will ambulate at least 500' over outdoor unlevel surfaces with LRAD with mod I in order to demo improved community mobility.  Baseline: 345' SBA SPC indoor  ramp/curb/floor (8/13) Goal status: INITIAL  4.  Pt will improve ABC to at least 70% in order to demo improved confidence with balance.  Baseline: 63.75% confident; 55.625% (8/13) Goal status: INITIAL  ASSESSMENT:  CLINICAL IMPRESSION: Assessed LTGs this visit with pt's confidence having significantly regressed possibly due to therapy exposing more deficits that pt anticipated vs prior awareness and insight.  He is able to use a [facility] w/ little instruction over indoor surfaces, but may benefit from further instruction for outdoor navigation if weather allows.  His FGA score improved from 17 to 21/30 pushing him into the moderate vs high fall risk category.  He has potential to continue to decrease fall risk and PT aim to recover confidence in his balance now that his safety awareness has increased.  Continue per POC.    OBJECTIVE IMPAIRMENTS: Abnormal gait, decreased activity tolerance, decreased balance, decreased coordination, decreased endurance, difficulty walking, impaired flexibility, and postural dysfunction.   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   REHAB POTENTIAL: Good  CLINICAL DECISION MAKING: Stable/uncomplicated  EVALUATION COMPLEXITY: Low  PLAN:  PT FREQUENCY: 1x/week  PT DURATION: 8 weeks + 4 weeks  PLANNED INTERVENTIONS: 97164- PT Re-evaluation, 97110-Therapeutic exercises, 97530- Therapeutic activity, [id number]- Neuromuscular re-education, 97535- Self Care, [id number]- Manual therapy, [id number]  Gait training, Patient/Family education, Balance training, Stair training, Vestibular training, and DME instructions  PLAN FOR NEXT SESSION: EC, narrow BOS, SLS, repeat resisted gait. Trial a cane/trekking pole for unlevel surfaces outdoors?  Squig pulls from floor and mirror - add blue mat.  Blaze pods w/ floor reach?  Advance balance portion of HEP!    Daved KATHEE Bull, PT, DPT 08/06/2024, 11:32 AM

## 2024-08-20 ENCOUNTER — Encounter: Payer: Self-pay | Admitting: Physical Therapy

## 2024-08-20 ENCOUNTER — Ambulatory Visit: Admitting: Physical Therapy

## 2024-08-20 DIAGNOSIS — R2681 Unsteadiness on feet: Secondary | ICD-10-CM | POA: Diagnosis not present

## 2024-08-20 DIAGNOSIS — R293 Abnormal posture: Secondary | ICD-10-CM

## 2024-08-20 NOTE — Therapy (Signed)
 OUTPATIENT PHYSICAL THERAPY NEURO TREATMENT   Patient Name: Jack James MRN: [id number] DOB:Mar 30, 1934, 89 y.o., male Today's Date: 08/20/2024   PCP: Windy Coy, MD REFERRING PROVIDER: Windy Coy, MD  END OF SESSION:  PT End of Session - 08/20/24 0856     Visit Number 8    Number of Visits 11   7 + 4   Date for PT Re-Evaluation 09/19/24   pushed out due to scheduling delay - pt unavailable x1 week in POC   Authorization Type [employer]    PT Start Time [phone]   PT with pt prior   PT Stop Time 0944    PT Time Calculation (min) 50 min    Equipment Utilized During Treatment Gait belt    Activity Tolerance Patient tolerated treatment well    Behavior During Therapy [facility] for tasks assessed/performed          Past Medical History:  Diagnosis Date   Acute on chronic diastolic heart failure (HCC) 11/19/2015   Ataxia 03/12/2024   Atrial fibrillation (HCC) 01/20/2016   postoperative    Atrial fibrillation and flutter (HCC) 01/20/2016   postoperative   Bilateral sensorineural hearing loss 09/19/2023   CAD (coronary artery disease), native coronary artery 08/04/2010   Cath 2011 with severe proximal LAD stenosis treated with 2.75 x 24 mm ion stent postdilated to 3.0 mm by Dr. Wonda  Catheterization 2016 shows 40% ostial LAD, patent stent, 50% mid LAD, 60% circumflex, and 40% RCA stenosis with severe mitral regurgitation.    Chest pain 05/02/2019   Chronic diastolic CHF (congestive heart failure) (HCC)    Coronary artery disease    a. 2011: DES to proximal LAD   Coronary artery disease involving coronary bypass graft of native heart 08/04/2010   Cath 2011 with severe proximal LAD stenosis treated with 2.75 x 24 mm ion stent postdilated to 3.0 mm by Dr. Wonda  Catheterization 2016 shows 40% ostial LAD, patent stent, 50% mid LAD, 60% circumflex, and 40% RCA stenosis with severe mitral regurgitation.   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   Other constipation 05/28/2020   Pleural effusion, bilateral 11/18/2015   S/P CABG x 3 11/25/2015   LIMA to LAD, SVG to RCA, SVG to OM2, EVH via right thigh    S/P mitral valve repair 11/25/2015   Complex valvuloplasty including quadrangular resection of posterior leaflet, artificial Gore-tex neochord placement x10 and 32 mm Sorin Memo 3D Rechord ring annuloplasty   Skin cancer    Stroke (HCC)    Unstable angina (HCC) 11/19/2015   Past Surgical History:  Procedure Laterality Date   CARDIAC CATHETERIZATION N/A 11/19/2015   Procedure: Left Heart Cath and Coronary Angiography;  Surgeon: Lonni JONETTA Cash, MD;  Location: [facility] INVASIVE CV LAB;  Service: Cardiovascular;  Laterality: N/A;   CATARACT EXTRACTION, BILATERAL  2017   CLIPPING OF ATRIAL APPENDAGE  11/25/2015   Procedure: CLIPPING OF ATRIAL APPENDAGE;  Surgeon: Sudie VEAR Laine, MD;  Location: [facility] OR;  Service: Open Heart Surgery;;   CORONARY ARTERY BYPASS GRAFT N/A 11/25/2015   Procedure: CORONARY ARTERY BYPASS GRAFTING (CABG) TIMES THREE USING LEFT INTERNAL MAMMARY ARTERY AND RIGHT SAPHENOUS LEG VEIN HARVESTED ENDOSCOPICALLY;  Surgeon: Sudie VEAR Laine, MD;  Location: [facility] OR;  Service: Open Heart Surgery;  Laterality: N/A;   CORONARY STENT PLACEMENT  2011   DES to proximal LAD   LEFT HEART CATH AND CORS/GRAFTS ANGIOGRAPHY N/A 05/02/2019   Procedure: LEFT HEART CATH AND CORS/GRAFTS ANGIOGRAPHY;  Surgeon: Claudene Victory ORN, MD;  Location: [facility] INVASIVE  CV LAB;  Service: Cardiovascular;  Laterality: N/A;   LITHOTRIPSY     MITRAL VALVE REPAIR N/A 11/25/2015   Procedure: MITRAL VALVE REPAIR (MVR);  Surgeon: Sudie VEAR Laine, MD;  Location: [facility] OR;  Service: Open Heart Surgery;  Laterality: N/A;   SKIN CANCER EXCISION     TEE WITHOUT CARDIOVERSION N/A 11/22/2015   Procedure: TRANSESOPHAGEAL ECHOCARDIOGRAM (TEE);  Surgeon: Vinie JAYSON Maxcy, MD;  Location: [facility] ENDOSCOPY;  Service: Cardiovascular;  Laterality: N/A;   TEE WITHOUT CARDIOVERSION N/A 11/25/2015   Procedure: TRANSESOPHAGEAL ECHOCARDIOGRAM (TEE);  Surgeon: Sudie VEAR Laine, MD;  Location: [facility] OR;  Service: Open Heart Surgery;  Laterality: N/A;   Patient Active Problem List   Diagnosis Date Noted   Ataxia 03/12/2024   Bilateral sensorineural hearing loss 09/19/2023   Imbalance 09/19/2023   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   History of colonic polyps 04/19/2022   Coronary artery disease    HLD (hyperlipidemia)    Mitral valve prolapse    Skin cancer    Stroke [facility])    Headache disorder 08/18/2020   Gastroesophageal reflux disease without esophagitis 05/28/2020   Other constipation 05/28/2020   Increased MCV 04/28/2020   History of CVA (cerebrovascular accident) 01/19/2020   Encounter for antineoplastic chemotherapy 12/28/2019   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   Chest pain 05/02/2019   Atrial fibrillation and flutter (HCC) 01/20/2016   Atrial fibrillation (HCC) 01/20/2016   S/P mitral valve repair + CABG x3 11/25/2015   S/P CABG x 3 11/25/2015   Unstable angina (HCC) 11/19/2015   MVP (mitral valve prolapse) 11/19/2015   Chronic diastolic CHF (congestive heart failure) (HCC) 11/19/2015   Acute on chronic diastolic heart failure (HCC) 11/19/2015   Mitral regurgitation 11/19/2015   Hyperlipidemia    Pleural effusion, bilateral 11/18/2015   Coronary artery disease involving coronary bypass graft of native heart 08/04/2010   CAD (coronary artery disease), native coronary  artery 08/04/2010    ONSET DATE: 05/01/2024  REFERRING DIAG: R27.0 (ICD-10-CM) - Ataxia, unspecified  THERAPY DIAG:  Unsteadiness on feet  Abnormal posture  Rationale for Evaluation and Treatment: Rehabilitation  SUBJECTIVE:  SUBJECTIVE STATEMENT: No falls, no further stumbles recently.  He is having no pain.  Son inquires about printing evaluation and if pt HEP should be updated.  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 No  There were no vitals filed for this visit.  PRECAUTIONS: Fall  FALLS: Has patient fallen in last 6 months? No  LIVING ENVIRONMENT: Lives with: lives with his son  Lives in: House/apartment Stairs: no stairs, one level  Has following equipment at home: Single point cane, Walker - 2 wheeled, and does not use them   PLOF: Independent Son helps with yardwork, just driving in town   PATIENT GOALS: Wants to see what can be worked on for his balance. Improve his posture and mobility in general   OBJECTIVE:  Note: Objective measures were completed at Evaluation unless otherwise noted.  DIAGNOSTIC FINDINGS: MRI brain 12/2021:  IMPRESSION:  1. Mild enhancement in the right orbital fat is stable but with less T2 signal, compatible with prior radiation therapy. 2. Encephalomalacia at the site of the previous left cerebellar mass. No evidence of residual or recurrent mass. No abnormal enhancement. 3. Stable small vessel disease changes. Unenhancing subcortical T2 hyperintensity in the right  frontal lobe is nonspecific but stable. Attention on follow-up. 4. Probable tiny developmental vascular anomaly and right midbrain is stable. No other significant interval changes.  MRI brain 07/10/2022:   IMPRESSION: 1. Stable postsurgical changes left posterior fossa 2. Stable enhancement right orbit 3. Stable foci abnormal white matter signal elsewhere likely small vessel disease 4. 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   COGNITION: Overall cognitive status: Within functional limits for tasks assessed   SENSATION: WFL  COORDINATION: RAM: slight dysmetria with LLE  Heel to shin: WNL    POSTURE: rounded shoulders, forward head, increased thoracic kyphosis, and flexed trunk   LOWER EXTREMITY ROM:    Slightly decr knee extension AROM   LOWER EXTREMITY MMT:    MMT Right Eval Left Eval  Hip flexion 5 5  Hip extension    Hip abduction    Hip adduction    Hip internal rotation    Hip external rotation    Knee flexion 5 5  Knee extension 5 5  Ankle dorsiflexion 5 5  Ankle plantarflexion    Ankle inversion    Ankle eversion    (Blank rows = not tested)   TRANSFERS: Sit to stand: Modified independence  Assistive device utilized: None     Stand to sit: Modified independence  Assistive device utilized: None      GAIT: Findings: Gait Characteristics: step through pattern, decreased stride length, decreased trunk rotation, and trunk flexed, Distance walked: Clinic distances, Assistive device utilized:None, Level of assistance: SBA, and Comments: pt reporting feeling more off balance when turning quickly or having to change directions   FUNCTIONAL TESTS:  5 times sit to stand: 9.4 seconds with no UE support  10 meter walk test: 12.9 seconds with no AD = 2.54 ft/sec    M-CTSIB  Condition 1: Firm Surface, EO 30 Sec, Normal Sway  Condition 2: Firm Surface, EC 30 Sec, Normal Sway  Condition 3: Foam Surface, EO 30 Sec, Normal Sway  Condition 4: Foam Surface,  EC 30  Sec, Mild Sway                                                                                                                                TREATMENT DATE: 08/20/24 Progressed this HEP: Access Code: [email or web address] URL: [email or web address] Date: 07/02/2024 Prepared by: Sheffield Senate  Exercises - Standing Balance with Eyes Closed on Foam  - 1-2 x daily - 5 x weekly - 3 sets - 30 hold - Romberg Stance on Foam Pad with Head Rotation  - 1-2 x daily - 5 x weekly - 2 sets - 10 reps - Tandem Walking with Counter Support  - 1-2 x daily - 5 x weekly - 3 sets - Standing Marching  - 1-2 x daily - 5 x weekly - 3 sets - Walking with Head Rotation  - 1-2 x daily - 5 x weekly - 3 sets - Scapular retraction with resistance  - 1 x daily - 5 x weekly - 2 sets - 10 reps - Seated Shoulder Diagonal with Resistance  - 1 x daily - 5 x weekly - 1-2 sets - 10 reps  To this (provided and added green band and progressions away from UE support - see MedBridge notes; wrote exercises on each band for pt per request): Access Code: [email or web address] URL: [email or web address] Date: 08/20/2024 Prepared by: Daved Bull  Exercises - Standing Balance with Eyes Closed on Foam  - 1-2 x daily - 5 x weekly - 3 sets - 30 hold - Romberg Stance on Foam Pad with Head Rotation  - 1-2 x daily - 5 x weekly - 2 sets - 10 reps - Tandem Walking with Counter Support  - 1-2 x daily - 5 x weekly - 3 sets - Walking with Head Rotation  - 1-2 x daily - 5 x weekly - 3 sets - Scapular retraction with resistance  - 1 x daily - 5 x weekly - 2 sets - 10 reps - Seated Shoulder Diagonal with Resistance  - 1 x daily - 5 x weekly - 1-2 sets - 10 reps - Corner Balance Feet Together With Eyes Closed  - 1 x daily - 5 x weekly - 1 sets - 3 reps - 30 seconds hold - Standing Tandem Balance with Counter Support  - 1 x daily - 5 x weekly - 3 sets - 10 reps - Marching with Resistance  - 1 x daily - 5 x weekly - 3 sets -  10 reps - Side Stepping with Resistance at Thighs and Counter Support  - 1 x daily - 5 x weekly - 3 sets - 10 reps - Forward Backward Monster Walk with Band at Thighs and Counter Support  - 1 x daily - 5 x weekly - 3 sets - 10 reps  PATIENT EDUCATION: Education details: Continue HEP as updated today.  Informed them of printing from MyChart which they have tried and requesting through pt records. Working with cane  and trekking pole for improved outdoor management - pt unsure he will use it, but open to options. Person educated: Patient and Child(ren) Education method: Medical illustrator Education comprehension: verbalized understanding, returned demonstration, and needs further education  HOME EXERCISE PROGRAM: Access Code: [email or web address] URL: [email or web address] Date: 08/20/2024 Prepared by: Daved Bull  Exercises - Standing Balance with Eyes Closed on Foam  - 1-2 x daily - 5 x weekly - 3 sets - 30 hold - Romberg Stance on Foam Pad with Head Rotation  - 1-2 x daily - 5 x weekly - 2 sets - 10 reps - Tandem Walking with Counter Support  - 1-2 x daily - 5 x weekly - 3 sets - Walking with Head Rotation  - 1-2 x daily - 5 x weekly - 3 sets - Scapular retraction with resistance  - 1 x daily - 5 x weekly - 2 sets - 10 reps - Seated Shoulder Diagonal with Resistance  - 1 x daily - 5 x weekly - 1-2 sets - 10 reps - Corner Balance Feet Together With Eyes Closed  - 1 x daily - 5 x weekly - 1 sets - 3 reps - 30 seconds hold - Standing Tandem Balance with Counter Support  - 1 x daily - 5 x weekly - 3 sets - 10 reps - Marching with Resistance  - 1 x daily - 5 x weekly - 3 sets - 10 reps - Side Stepping with Resistance at Thighs and Counter Support  - 1 x daily - 5 x weekly - 3 sets - 10 reps - Forward Backward Monster Walk with Band at Thighs and Counter Support  - 1 x daily - 5 x weekly - 3 sets - 10 reps  GOALS: Goals reviewed with patient? Yes  SHORT TERM GOALS: Target  date: 07/16/2024  Pt will be independent with initial HEP for posture, gait, balance in order to build upon functional gains made in therapy. Baseline:  IND and Compliant (7/23) Goal status: MET  2.  Pt will improve FGA to at least a 20/30 in order to demo decr fall risk. Baseline: 17/30; 17/30 (7/23) Goal status: NOT MET  3.  Pt will improve gait speed with no AD vs. LRAD to at least 2.8 ft/sec in order to demo improved community mobility.  Baseline: 12.9 seconds with no AD = 2.54 ft/sec; 2.99 ft/sec no AD SBA (7/23) Goal status: MET  4.  ABC scale to be assessed with LTG written.   Baseline:63.75% confident  Goal status: MET   LONG TERM GOALS: Target date: 08/13/2024  Pt will be independent with final HEP for posture, gait, balance in order to build upon functional gains made in therapy. Baseline: IND and compliant, reports ongoing challenge w/ postural exercises, but balance portion needs advancement (8/13) Goal status: PARTIALLY MET  2.  Pt will improve FGA to at least a 20/30 in order to demo decr fall risk. Baseline: 17/30; 21/30 (8/13) Goal status: MET  3.  Pt will ambulate at least 500' over outdoor unlevel surfaces with LRAD with mod I in order to demo improved community mobility.  Baseline: 345' SBA SPC indoor ramp/curb/floor (8/13) Goal status: PARTIALLY MET  4.  Pt will improve ABC to at least 70% in order to demo improved confidence with balance.  Baseline: 63.75% confident; 55.625% (8/13) Goal status: NOT MET  GOALS (at 1/86 re-cert): Goals reviewed with patient? Yes  SHORT TERM GOALS = LONG TERM GOALS: Target date: 09/12/2024  Pt will  be independent with advanced and finalized HEP for posture, gait, balance in order to build upon functional gains made in therapy. Baseline: IND and compliant, reports ongoing challenge w/ postural exercises, but balance portion needs advancement (8/13) Goal status: INITIAL  2.  Pt will improve FGA to at least a 25/30 in order  to demo decr fall risk. Baseline: 17/30; 21/30 (8/13) Goal status: REVISED  3.  Pt will ambulate at least 500' over outdoor unlevel surfaces with LRAD with mod I in order to demo improved community mobility.  Baseline: 345' SBA SPC indoor ramp/curb/floor (8/13) Goal status: INITIAL  4.  Pt will improve ABC to at least 70% in order to demo improved confidence with balance.  Baseline: 63.75% confident; 55.625% (8/13) Goal status: INITIAL  ASSESSMENT:  CLINICAL IMPRESSION: Emphasis of skilled PT session today on reviewing and advancing existing HEP to increase strength and balance challenges.  Modified existing exercises to incorporate higher resistance band and increased ROM.  Added tasks focused on progressing static stability with vestibular reliance and hip engagement to dynamic tasks.  His posture continues to create a posterior bias of which correction is difficult for him to maintain, but he continues to work hard to correct flexible components of this deficit.  Continue per POC.   OBJECTIVE IMPAIRMENTS: Abnormal gait, decreased activity tolerance, decreased balance, decreased coordination, decreased endurance, difficulty walking, impaired flexibility, and postural dysfunction.   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   REHAB POTENTIAL: Good  CLINICAL DECISION MAKING: Stable/uncomplicated  EVALUATION COMPLEXITY: Low  PLAN:  PT FREQUENCY: 1x/week  PT DURATION: 8 weeks + 4 weeks  PLANNED  INTERVENTIONS: 97164- PT Re-evaluation, 97110-Therapeutic exercises, 97530- Therapeutic activity, 97112- Neuromuscular re-education, 97535- Self Care, [id number]- Manual therapy, [phone]- Gait training, Patient/Family education, Balance training, Stair training, Vestibular training, and DME instructions  PLAN FOR NEXT SESSION: EC, narrow BOS, SLS, repeat resisted gait. Trial a cane/trekking pole for unlevel surfaces outdoors? Blaze pods w/ floor reach?    Daved KATHEE Bull, PT, DPT 08/20/2024, 12:01 PM

## 2024-08-20 NOTE — Patient Instructions (Signed)
 Access Code: [email or web address] URL: [email or web address] Date: 08/20/2024 Prepared by: Daved Bull  Exercises - Standing Balance with Eyes Closed on Foam  - 1-2 x daily - 5 x weekly - 3 sets - 30 hold - Romberg Stance on Foam Pad with Head Rotation  - 1-2 x daily - 5 x weekly - 2 sets - 10 reps - Tandem Walking with Counter Support  - 1-2 x daily - 5 x weekly - 3 sets - Walking with Head Rotation  - 1-2 x daily - 5 x weekly - 3 sets - Scapular retraction with resistance  - 1 x daily - 5 x weekly - 2 sets - 10 reps - Seated Shoulder Diagonal with Resistance  - 1 x daily - 5 x weekly - 1-2 sets - 10 reps - Corner Balance Feet Together With Eyes Closed  - 1 x daily - 5 x weekly - 1 sets - 3 reps - 30 seconds hold - Standing Tandem Balance with Counter Support  - 1 x daily - 5 x weekly - 3 sets - 10 reps - Marching with Resistance  - 1 x daily - 5 x weekly - 3 sets - 10 reps - Side Stepping with Resistance at Thighs and Counter Support  - 1 x daily - 5 x weekly - 3 sets - 10 reps - Forward Backward Monster Walk with Band at Thighs and Counter Support  - 1 x daily - 5 x weekly - 3 sets - 10 reps

## 2024-08-27 ENCOUNTER — Encounter: Payer: Self-pay | Admitting: Physical Therapy

## 2024-08-27 ENCOUNTER — Ambulatory Visit: Attending: Family Medicine | Admitting: Physical Therapy

## 2024-08-27 DIAGNOSIS — R2681 Unsteadiness on feet: Secondary | ICD-10-CM | POA: Diagnosis present

## 2024-08-27 DIAGNOSIS — R293 Abnormal posture: Secondary | ICD-10-CM | POA: Insufficient documentation

## 2024-08-27 NOTE — Therapy (Signed)
 OUTPATIENT PHYSICAL THERAPY NEURO TREATMENT   Patient Name: JIHAAD BRUSCHI MRN: [id number] DOB:1934/04/18, 89 y.o., male Today's Date: 08/27/2024   PCP: Windy Coy, MD REFERRING PROVIDER: Windy Coy, MD  END OF SESSION:  PT End of Session - 08/27/24 0803     Visit Number 9    Number of Visits 11   7 + 4   Date for PT Re-Evaluation 09/19/24   pushed out due to scheduling delay - pt unavailable x1 week in POC   Authorization Type [employer]ARE    PT Start Time 0801    PT Stop Time [phone]    PT Time Calculation (min) 46 min    Equipment Utilized During Treatment Gait belt    Activity Tolerance Patient tolerated treatment well    Behavior During Therapy [facility] for tasks assessed/performed          Past Medical History:  Diagnosis Date   Acute on chronic diastolic heart failure (HCC) 11/19/2015   Ataxia 03/12/2024   Atrial fibrillation (HCC) 01/20/2016   postoperative    Atrial fibrillation and flutter (HCC) 01/20/2016   postoperative   Bilateral sensorineural hearing loss 09/19/2023   CAD (coronary artery disease), native coronary artery 08/04/2010   Cath 2011 with severe proximal LAD stenosis treated with 2.75 x 24 mm ion stent postdilated to 3.0 mm by Dr. Wonda  Catheterization 2016 shows 40% ostial LAD, patent stent, 50% mid LAD, 60% circumflex, and 40% RCA stenosis with severe mitral regurgitation.    Chest pain 05/02/2019   Chronic diastolic CHF (congestive heart failure) (HCC)    Coronary artery disease    a. 2011: DES to proximal LAD   Coronary artery disease involving coronary bypass graft of native heart 08/04/2010   Cath 2011 with severe proximal LAD stenosis treated with 2.75 x 24 mm ion stent postdilated to 3.0 mm by Dr. Wonda  Catheterization 2016 shows 40% ostial LAD, patent stent, 50% mid LAD, 60% circumflex, and 40% RCA stenosis with severe mitral regurgitation.   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   Other constipation 05/28/2020   Pleural effusion, bilateral 11/18/2015   S/P CABG x 3 11/25/2015   LIMA to LAD, SVG to RCA, SVG to OM2, EVH via right thigh    S/P mitral valve repair 11/25/2015   Complex valvuloplasty including quadrangular resection of posterior leaflet, artificial Gore-tex neochord placement x10 and 32 mm Sorin Memo 3D Rechord ring annuloplasty   Skin cancer    Stroke (HCC)    Unstable angina (HCC) 11/19/2015   Past Surgical History:  Procedure Laterality Date   CARDIAC CATHETERIZATION N/A 11/19/2015   Procedure: Left Heart Cath and Coronary Angiography;  Surgeon: Lonni JONETTA Cash, MD;  Location: [facility] INVASIVE CV LAB;  Service: Cardiovascular;  Laterality: N/A;   CATARACT EXTRACTION, BILATERAL  2017   CLIPPING OF ATRIAL APPENDAGE  11/25/2015   Procedure: CLIPPING OF ATRIAL APPENDAGE;  Surgeon: Sudie VEAR Laine, MD;  Location: [facility] OR;  Service: Open Heart Surgery;;  CORONARY ARTERY BYPASS GRAFT N/A 11/25/2015   Procedure: CORONARY ARTERY BYPASS GRAFTING (CABG) TIMES THREE USING LEFT INTERNAL MAMMARY ARTERY AND RIGHT SAPHENOUS LEG VEIN HARVESTED ENDOSCOPICALLY;  Surgeon: Sudie VEAR Laine, MD;  Location: [facility] OR;  Service: Open Heart Surgery;  Laterality: N/A;   CORONARY STENT PLACEMENT  2011   DES to proximal LAD   LEFT HEART CATH AND CORS/GRAFTS ANGIOGRAPHY N/A 05/02/2019   Procedure: LEFT HEART CATH AND CORS/GRAFTS ANGIOGRAPHY;  Surgeon: Claudene Victory ORN, MD;  Location: [facility] INVASIVE CV LAB;  Service:  Cardiovascular;  Laterality: N/A;   LITHOTRIPSY     MITRAL VALVE REPAIR N/A 11/25/2015   Procedure: MITRAL VALVE REPAIR (MVR);  Surgeon: Sudie VEAR Laine, MD;  Location[facility] OR;  Service: Open Heart Surgery;  Laterality: N/A;   SKIN CANCER EXCISION     TEE WITHOUT CARDIOVERSION N/A 11/22/2015   Procedure: TRANSESOPHAGEAL ECHOCARDIOGRAM (TEE);  Surgeon: Vinie JAYSON Maxcy, MD;  Location: [facility];  Service: Cardiovascular;  Laterality: N/A;   TEE WITHOUT CARDIOVERSION N/A 11/25/2015   Procedure: TRANSESOPHAGEAL ECHOCARDIOGRAM (TEE);  Surgeon: Sudie VEAR Laine, MD;  Location: [facility] OR;  Service: Open Heart Surgery;  Laterality: N/A;   Patient Active Problem List   Diagnosis Date Noted   Ataxia 03/12/2024   Bilateral sensorineural hearing loss 09/19/2023   Imbalance 09/19/2023   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   History of colonic polyps 04/19/2022   Coronary artery disease    HLD (hyperlipidemia)    Mitral valve prolapse    Skin cancer    Stroke [facility])    Headache disorder 08/18/2020   Gastroesophageal reflux disease without esophagitis 05/28/2020   Other constipation 05/28/2020   Increased MCV 04/28/2020   History of CVA (cerebrovascular accident) 01/19/2020   Encounter for antineoplastic chemotherapy 12/28/2019   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   Chest pain 05/02/2019   Atrial fibrillation and flutter (HCC) 01/20/2016   Atrial fibrillation (HCC) 01/20/2016   S/P mitral valve repair + CABG x3 11/25/2015   S/P CABG x 3 11/25/2015   Unstable angina (HCC) 11/19/2015   MVP (mitral valve prolapse) 11/19/2015   Chronic diastolic CHF (congestive heart failure) (HCC) 11/19/2015   Acute on chronic diastolic heart failure (HCC) 11/19/2015   Mitral regurgitation 11/19/2015   Hyperlipidemia    Pleural effusion, bilateral 11/18/2015   Coronary artery disease involving coronary bypass graft of native heart 08/04/2010   CAD (coronary artery disease), native coronary artery 08/04/2010     ONSET DATE: 05/01/2024  REFERRING DIAG: R27.0 (ICD-10-CM) - Ataxia, unspecified  THERAPY DIAG:  Unsteadiness on feet  Abnormal posture  Rationale for Evaluation and Treatment: Rehabilitation  SUBJECTIVE:  SUBJECTIVE STATEMENT: No falls, no further stumbles recently.  He is having no pain.  Son states pt has some questions about HEP - pt reports he has questions about tandem holds vs reps and a few others.  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 No  There were no vitals filed for this visit.  PRECAUTIONS: Fall  FALLS: Has patient fallen in last 6 months? No  LIVING ENVIRONMENT: Lives with: lives with his son  Lives in: House/apartment Stairs: no stairs, one level  Has following equipment at home: Single point cane, Walker - 2 wheeled, and does not use them   PLOF: Independent Son helps with yardwork, just driving in town   PATIENT GOALS: Wants to see what can be worked on for his balance. Improve his posture and mobility in general   OBJECTIVE:  Note: Objective measures were completed at Evaluation unless otherwise noted.  DIAGNOSTIC FINDINGS: MRI brain 12/2021:  IMPRESSION:  1. Mild enhancement in the right orbital fat is stable but with less T2 signal, compatible with prior radiation therapy. 2. Encephalomalacia at the site of the previous left cerebellar mass. No evidence of residual or recurrent mass. No abnormal enhancement. 3. Stable small vessel disease changes. Unenhancing subcortical  T2 hyperintensity in the right frontal lobe is nonspecific but stable. Attention on follow-up. 4. Probable tiny developmental vascular anomaly and right midbrain is stable. No other significant interval changes.  MRI brain 07/10/2022:   IMPRESSION: 1. Stable postsurgical changes left posterior fossa 2. Stable enhancement right orbit 3. Stable foci abnormal white matter signal elsewhere likely small vessel disease 4. 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   COGNITION: Overall cognitive status: Within functional limits for tasks assessed   SENSATION: WFL  COORDINATION: RAM: slight dysmetria with LLE  Heel to shin: WNL    POSTURE: rounded shoulders, forward head, increased thoracic kyphosis, and flexed trunk   LOWER EXTREMITY ROM:    Slightly decr knee extension AROM   LOWER EXTREMITY MMT:    MMT Right Eval Left Eval  Hip flexion 5 5  Hip extension    Hip abduction    Hip adduction    Hip internal rotation    Hip external rotation    Knee flexion 5 5  Knee extension 5 5  Ankle dorsiflexion 5 5  Ankle plantarflexion    Ankle inversion    Ankle eversion    (Blank rows = not tested)   TRANSFERS: Sit to stand: Modified independence  Assistive device utilized: None     Stand to sit: Modified independence  Assistive device utilized: None      GAIT: Findings: Gait Characteristics: step through pattern, decreased stride length, decreased trunk rotation, and trunk flexed, Distance walked: Clinic distances, Assistive device utilized:None, Level of assistance: SBA, and Comments: pt reporting feeling more off balance when turning quickly or having to change directions   FUNCTIONAL TESTS:  5 times sit to stand: 9.4 seconds with no UE support  10 meter walk test: 12.9 seconds with no AD = 2.54 ft/sec    M-CTSIB  Condition 1: Firm Surface, EO 30 Sec, Normal Sway  Condition 2: Firm Surface, EC 30 Sec, Normal Sway  Condition 3: Foam Surface, EO 30  Sec, Normal  Sway  Condition 4: Foam Surface, EC 30 Sec, Mild Sway                                                                                                                                TREATMENT DATE: 08/27/24 -Time spent adjusting and explaining portions of HEP - adjusted reps/holds (see HEP section below). -SciFit using BUE/BLE support in multi-peaks mode up to level 5.0 over 8 minutes for global strengthening and endurance, cued for workload/pace equivalent 6-7/10. RAMP:  Level of Assistance: SBA and CGA Assistive device utilized: Single point cane and trekking pole Ramp Comments: Attempted on sidewalk outside w/ better management w/ SPC.   CURB:  Level of Assistance: SBA and CGA Assistive device utilized: Single point cane and trekking pole Curb Comments: He has single posterior LOB requiring CGA to recover when not placing foot completely onto sidewalk step up and removing AD from support surface mid-push.  He was able to correct this when demonstrating step up w/ use of SPC on second attempt.  GAIT: Gait pattern: step to pattern, step through pattern, decreased hip/knee flexion- Right, decreased hip/knee flexion- Left, trunk flexed, and narrow BOS Distance walked: 400 ft Assistive device utilized: Single point cane and trekking pole Level of assistance: SBA and CGA Comments: Pt ambulates over unlevel sidewalk and grass.  He has good stability w/ both devices reporting preference w/ trekking pole.  Discussed benefit of using in yard for sloped surfaces and endurance.  He outpaces both devices when walking on level surfaces or unlevel sidewalk so recommending only in select settings like long and very unlevel terrain.  Had him practice more consistent sequencing pattern of device and maintaining larger stride w/ improvement.   PATIENT EDUCATION: Education details: Continue HEP as updated today.  See above for edu on SPC vs trekking pole. Person educated: Patient and  Child(ren) Education method: Medical illustrator Education comprehension: verbalized understanding, returned demonstration, and needs further education  HOME EXERCISE PROGRAM: Access Code: [email or web address] URL: [email or web address] Date: 08/27/2024 Prepared by: Daved Bull  Exercises - Standing Balance with Eyes Closed on Foam  - 1-2 x daily - 5 x weekly - 3 sets - 30 seconds hold - Romberg Stance on Foam Pad with Head Rotation  - 1-2 x daily - 5 x weekly - 2 sets - 10 reps - Tandem Walking with Counter Support  - 1-2 x daily - 5 x weekly - 3 sets - Walking with Head Rotation  - 1-2 x daily - 5 x weekly - 3 sets - Scapular retraction with resistance  - 1 x daily - 5 x weekly - 2 sets - 10 reps - Seated Shoulder Diagonal with Resistance  - 1 x daily - 5 x weekly - 1-2 sets - 10 reps - Corner Balance Feet Together With Eyes Closed  - 1 x daily - 5 x weekly - 1 sets - 3 reps - 30 seconds  hold - Standing Tandem Balance with Counter Support  - 1 x daily - 5 x weekly - 1 sets - 3 reps - 30 seconds hold - Marching with Resistance  - 1 x daily - 5 x weekly - 3 sets - 10 reps - Side Stepping with Resistance at Thighs and Counter Support  - 1 x daily - 5 x weekly - 3 sets - Forward Backward Monster Walk with Band at Thighs and Counter Support  - 1 x daily - 5 x weekly - 3 sets  GOALS: Goals reviewed with patient? Yes  SHORT TERM GOALS: Target date: 07/16/2024  Pt will be independent with initial HEP for posture, gait, balance in order to build upon functional gains made in therapy. Baseline:  IND and Compliant (7/23) Goal status: MET  2.  Pt will improve FGA to at least a 20/30 in order to demo decr fall risk. Baseline: 17/30; 17/30 (7/23) Goal status: NOT MET  3.  Pt will improve gait speed with no AD vs. LRAD to at least 2.8 ft/sec in order to demo improved community mobility.  Baseline: 12.9 seconds with no AD = 2.54 ft/sec; 2.99 ft/sec no AD SBA (7/23) Goal  status: MET  4.  ABC scale to be assessed with LTG written.   Baseline:63.75% confident  Goal status: MET   LONG TERM GOALS: Target date: 08/13/2024  Pt will be independent with final HEP for posture, gait, balance in order to build upon functional gains made in therapy. Baseline: IND and compliant, reports ongoing challenge w/ postural exercises, but balance portion needs advancement (8/13) Goal status: PARTIALLY MET  2.  Pt will improve FGA to at least a 20/30 in order to demo decr fall risk. Baseline: 17/30; 21/30 (8/13) Goal status: MET  3.  Pt will ambulate at least 500' over outdoor unlevel surfaces with LRAD with mod I in order to demo improved community mobility.  Baseline: 345' SBA SPC indoor ramp/curb/floor (8/13) Goal status: PARTIALLY MET  4.  Pt will improve ABC to at least 70% in order to demo improved confidence with balance.  Baseline: 63.75% confident; 55.625% (8/13) Goal status: NOT MET  GOALS (at 1/86 re-cert): Goals reviewed with patient? Yes  SHORT TERM GOALS = LONG TERM GOALS: Target date: 09/12/2024  Pt will be independent with advanced and finalized HEP for posture, gait, balance in order to build upon functional gains made in therapy. Baseline: IND and compliant, reports ongoing challenge w/ postural exercises, but balance portion needs advancement (8/13) Goal status: INITIAL  2.  Pt will improve FGA to at least a 25/30 in order to demo decr fall risk. Baseline: 17/30; 21/30 (8/13) Goal status: REVISED  3.  Pt will ambulate at least 500' over outdoor unlevel surfaces with LRAD with mod I in order to demo improved community mobility.  Baseline: 345' SBA SPC indoor ramp/curb/floor (8/13) Goal status: INITIAL  4.  Pt will improve ABC to at least 70% in order to demo improved confidence with balance.  Baseline: 63.75% confident; 55.625% (8/13) Goal status: INITIAL  ASSESSMENT:  CLINICAL IMPRESSION: Primary focus of skilled PT session today on  addressing AD use over outdoor unlevel, particularly grassy, surfaces as pt and son have reported previously that getting outside is hard for him due to balance.  Pt is not open to trekking pole option, but is open to [facility] for limited uses.  Discussed locating and bringing his cane to future PT session to further assess use and benefit as he  feels clinic [facility] is structured differently.  He continues to benefit from skilled PT to address high level dynamic balance strategies and AD use as needed.  Continue per POC.   OBJECTIVE IMPAIRMENTS: Abnormal gait, decreased activity tolerance, decreased balance, decreased coordination, decreased endurance, difficulty walking, impaired flexibility, and postural dysfunction.   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   REHAB POTENTIAL: Good  CLINICAL DECISION MAKING: Stable/uncomplicated  EVALUATION COMPLEXITY: Low  PLAN:  PT FREQUENCY: 1x/week  PT DURATION: 8 weeks + 4 weeks  PLANNED INTERVENTIONS: 97164- PT Re-evaluation, 97110-Therapeutic exercises, 97530- Therapeutic activity, 97112- Neuromuscular re-education, 97535- Self Care, [id number]- Manual therapy, [phone]- Gait training, Patient/Family education, Balance training, Stair training, Vestibular training, and DME instructions  PLAN FOR NEXT SESSION: EC, narrow BOS, SLS, repeat resisted gait. Did he bring his SPC?  Blaze pods w/ floor reach?  Daved KATHEE Bull, PT, DPT 08/27/2024, 12:01 PM

## 2024-09-03 ENCOUNTER — Ambulatory Visit: Admitting: Physical Therapy

## 2024-09-03 ENCOUNTER — Encounter: Payer: Self-pay | Admitting: Physical Therapy

## 2024-09-03 DIAGNOSIS — R2681 Unsteadiness on feet: Secondary | ICD-10-CM

## 2024-09-03 DIAGNOSIS — R293 Abnormal posture: Secondary | ICD-10-CM

## 2024-09-03 NOTE — Therapy (Signed)
 OUTPATIENT PHYSICAL THERAPY NEURO TREATMENT/10th VISIT PN   Patient Name: Jack James MRN: [id number] DOB:December 10, 1934, 89 y.o., male Today's Date: 09/03/2024   PCP: Windy Coy, MD REFERRING PROVIDER: Windy Coy, MD  10th Visit Physical Therapy Progress Note  Dates of Reporting Period: 06/18/24 to 09/03/24   END OF SESSION:  PT End of Session - 09/03/24 1314     Visit Number 10    Number of Visits 11   7 + 4   Date for PT Re-Evaluation 09/19/24   pushed out due to scheduling delay - pt unavailable x1 week in POC   Authorization Type [employer]ARE    PT Start Time 1313    PT Stop Time 1356    PT Time Calculation (min) 43 min    Equipment Utilized During Treatment Gait belt    Activity Tolerance Patient tolerated treatment well    Behavior During Therapy [facility] for tasks assessed/performed           Past Medical History:  Diagnosis Date   Acute on chronic diastolic heart failure (HCC) 11/19/2015   Ataxia 03/12/2024   Atrial fibrillation (HCC) 01/20/2016   postoperative    Atrial fibrillation and flutter (HCC) 01/20/2016   postoperative   Bilateral sensorineural hearing loss 09/19/2023   CAD (coronary artery disease), native coronary artery 08/04/2010   Cath 2011 with severe proximal LAD stenosis treated with 2.75 x 24 mm ion stent postdilated to 3.0 mm by Dr. Wonda  Catheterization 2016 shows 40% ostial LAD, patent stent, 50% mid LAD, 60% circumflex, and 40% RCA stenosis with severe mitral regurgitation.    Chest pain 05/02/2019   Chronic diastolic CHF (congestive heart failure) (HCC)    Coronary artery disease    a. 2011: DES to proximal LAD   Coronary artery disease involving coronary bypass graft of native heart 08/04/2010   Cath 2011 with severe proximal LAD stenosis treated with 2.75 x 24 mm ion stent postdilated to 3.0 mm by Dr. Wonda  Catheterization 2016 shows 40% ostial LAD, patent stent, 50% mid LAD, 60% circumflex, and 40% RCA  stenosis with severe mitral regurgitation.   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   Other constipation 05/28/2020   Pleural effusion, bilateral 11/18/2015   S/P CABG x 3 11/25/2015   LIMA to LAD, SVG to RCA, SVG to OM2, EVH via right thigh    S/P mitral valve repair 11/25/2015   Complex valvuloplasty including quadrangular resection of posterior leaflet, artificial Gore-tex neochord placement x10 and 32 mm Sorin Memo 3D Rechord ring annuloplasty   Skin cancer    Stroke (HCC)    Unstable angina (HCC) 11/19/2015   Past Surgical History:  Procedure Laterality Date   CARDIAC CATHETERIZATION N/A 11/19/2015   Procedure: Left Heart Cath and Coronary Angiography;  Surgeon: Lonni JONETTA Cash, MD;  Location: [facility] INVASIVE CV LAB;  Service: Cardiovascular;  Laterality: N/A;   CATARACT EXTRACTION, BILATERAL  2017   CLIPPING OF ATRIAL APPENDAGE  11/25/2015   Procedure:  CLIPPING OF ATRIAL APPENDAGE;  Surgeon: Sudie VEAR Laine, MD;  Location: [facility] OR;  Service: Open Heart Surgery;;   CORONARY ARTERY BYPASS GRAFT N/A 11/25/2015   Procedure: CORONARY ARTERY BYPASS GRAFTING (CABG) TIMES THREE USING LEFT INTERNAL MAMMARY ARTERY AND RIGHT SAPHENOUS LEG VEIN HARVESTED ENDOSCOPICALLY;  Surgeon: Sudie VEAR Laine, MD;  Location: [facility] OR;  Service: Open Heart Surgery;  Laterality: N/A;   CORONARY STENT PLACEMENT  2011   DES to proximal LAD   LEFT HEART CATH AND CORS/GRAFTS ANGIOGRAPHY N/A 05/02/2019    Procedure: LEFT HEART CATH AND CORS/GRAFTS ANGIOGRAPHY;  Surgeon: Claudene Victory ORN, MD;  Location: [facility] INVASIVE CV LAB;  Service: Cardiovascular;  Laterality: N/A;   LITHOTRIPSY     MITRAL VALVE REPAIR N/A 11/25/2015   Procedure: MITRAL VALVE REPAIR (MVR);  Surgeon: Sudie VEAR Laine, MD;  Location[facility] OR;  Service: Open Heart Surgery;  Laterality: N/A;   SKIN CANCER EXCISION     TEE WITHOUT CARDIOVERSION N/A 11/22/2015   Procedure: TRANSESOPHAGEAL ECHOCARDIOGRAM (TEE);  Surgeon: Vinie JAYSON Maxcy, MD;  Location: [facility] ENDOSCOPY;  Service: Cardiovascular;  Laterality: N/A;   TEE WITHOUT CARDIOVERSION N/A 11/25/2015   Procedure: TRANSESOPHAGEAL ECHOCARDIOGRAM (TEE);  Surgeon: Sudie VEAR Laine, MD;  Location:  [facility] OR;  Service: Open Heart Surgery;  Laterality: N/A;   Patient Active Problem List   Diagnosis Date Noted   Ataxia 03/12/2024   Bilateral sensorineural hearing loss 09/19/2023   Imbalance 09/19/2023   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   History of colonic polyps 04/19/2022   Coronary artery disease    HLD (hyperlipidemia)    Mitral valve prolapse    Skin cancer    Stroke [facility])    Headache disorder 08/18/2020   Gastroesophageal reflux disease without esophagitis 05/28/2020   Other constipation 05/28/2020   Increased MCV 04/28/2020   History of CVA (cerebrovascular accident) 01/19/2020   Encounter for antineoplastic chemotherapy 12/28/2019   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   Chest pain 05/02/2019   Atrial fibrillation and flutter (HCC) 01/20/2016   Atrial fibrillation (HCC) 01/20/2016   S/P mitral valve repair + CABG x3 11/25/2015   S/P CABG x 3 11/25/2015   Unstable angina (HCC) 11/19/2015   MVP (mitral valve prolapse) 11/19/2015   Chronic diastolic CHF (congestive heart failure) (HCC) 11/19/2015   Acute on chronic diastolic heart failure (HCC) 11/19/2015   Mitral regurgitation 11/19/2015   Hyperlipidemia    Pleural effusion, bilateral 11/18/2015   Coronary artery disease  involving coronary bypass graft of native heart 08/04/2010   CAD (coronary artery disease), native coronary artery 08/04/2010    ONSET DATE: 05/01/2024  REFERRING DIAG: R27.0 (ICD-10-CM) - Ataxia, unspecified  THERAPY DIAG:  Unsteadiness on feet  Abnormal posture  Rationale for Evaluation and Treatment: Rehabilitation  SUBJECTIVE:  SUBJECTIVE STATEMENT: Brought in his cane from home. Has been working on his HEP, no further questions at this time. No falls.   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 No  There were no vitals filed for this visit.  PRECAUTIONS: Fall  FALLS: Has patient fallen in last 6 months? No  LIVING ENVIRONMENT: Lives with: lives with his son  Lives in: House/apartment Stairs: no stairs, one level  Has following equipment at home: Single point cane, Walker - 2 wheeled, and does not use them   PLOF: Independent Son helps with yardwork, just driving in town   PATIENT GOALS: Wants to see what can be worked on for his balance. Improve his posture and mobility in general   OBJECTIVE:  Note: Objective measures were completed at Evaluation unless otherwise noted.  DIAGNOSTIC FINDINGS: MRI brain 12/2021:  IMPRESSION:  1. Mild enhancement in the right orbital fat is stable but with less T2 signal, compatible with prior radiation therapy. 2. Encephalomalacia at the site of the previous left cerebellar mass. No evidence of residual or recurrent mass. No abnormal enhancement. 3. Stable small  vessel disease changes. Unenhancing subcortical T2 hyperintensity in the right frontal lobe is nonspecific but stable. Attention on follow-up. 4. Probable tiny developmental vascular anomaly and right midbrain is stable. No other significant interval changes.  MRI brain 07/10/2022:   IMPRESSION: 1. Stable postsurgical changes left posterior fossa 2. Stable enhancement right orbit 3. Stable foci abnormal white matter signal elsewhere likely small vessel disease 4. 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   COGNITION: Overall cognitive status: Within functional limits for tasks assessed   SENSATION: WFL  COORDINATION: RAM: slight dysmetria with LLE  Heel to shin: WNL    POSTURE: rounded shoulders, forward head, increased thoracic kyphosis, and flexed trunk   LOWER EXTREMITY ROM:    Slightly decr knee extension AROM   LOWER EXTREMITY MMT:    MMT Right Eval Left Eval  Hip flexion 5 5  Hip extension    Hip abduction    Hip adduction    Hip internal rotation    Hip external rotation    Knee flexion 5 5  Knee extension 5 5  Ankle dorsiflexion 5 5  Ankle plantarflexion    Ankle inversion    Ankle eversion    (Blank rows = not tested)   TRANSFERS: Sit to stand: Modified independence  Assistive device utilized: None     Stand to sit: Modified independence  Assistive device utilized: None      GAIT: Findings: Gait Characteristics: step through pattern, decreased stride length, decreased trunk rotation, and trunk flexed, Distance walked: Clinic distances, Assistive device utilized:None, Level of assistance: SBA, and Comments: pt reporting feeling more off balance when turning quickly or having to change directions   FUNCTIONAL TESTS:  5 times sit to stand: 9.4 seconds with no UE support  10 meter walk test: 12.9 seconds with no AD = 2.54 ft/sec    M-CTSIB  Condition 1: Firm Surface, EO 30 Sec, Normal Sway  Condition 2: Firm Surface, EC 30 Sec, Normal Sway   Condition 3: Foam Surface, EO 30 Sec, Normal Sway  Condition 4: Foam Surface, EC 30 Sec, Mild Sway  TREATMENT DATE: 09/03/24  Therapeutic Activity:  Discussed POC going forwards - will plan to D/C at next session and importance of continuing to perform HEP to maintain gains made in therapy    GAIT: Gait pattern: step to pattern, step through pattern, decreased hip/knee flexion- Right, decreased hip/knee flexion- Left, trunk flexed, and narrow BOS Distance walked: ~800' on grass and pavement Assistive device utilized: Single point cane Level of assistance: Modified independence and SBA Comments: pt brought in his own [facility] and was at proper height. Practiced outside over paved and grass surfaces with pt able to demo good sequencing pattern and able to ambulate primarily with mod I. Discussed using over outdoor surfaces esp the grass with pt in agreement with this    CURB:  Level of Assistance: SBA  Assistive device utilized: Single point cane  Curb Comments: reviewed proper technique for ascending/descending, pt demonstrating proper technique with no LOB   NMR:  On air ex: Sit to stand while holding 4# medicine ball, raising it overhead for posture and then tossing to floor and catching it, 2 x 10 reps, pt initially taking a few tries to get correct amplitude of toss to be able to catch it  Pt reporting RPE as 6-7/10 Seated rest break between each set  On blue foam beam next to ballet bar: Alternating lateral step overs over smaller orange obstacle, 15 reps each side, initially with fingertip > no UE support, intermittent min A due to losing balance posteriorly or pt grabbing onto bar With wide BOS- reaching across body superiorly to grab Squigz outside of BOS and then placing on opposite side in superior/lateral direction, 10 reps each side, intermittent LOB  anteriorly   PATIENT EDUCATION: Education details: plan to D/C from PT at next session, continue HEP  Person educated: Patient and Child(ren) Education method: Medical illustrator Education comprehension: verbalized understanding, returned demonstration, and needs further education  HOME EXERCISE PROGRAM: Access Code: [email or web address] URL: [email or web address] Date: 08/27/2024 Prepared by: Daved Bull  Exercises - Standing Balance with Eyes Closed on Foam  - 1-2 x daily - 5 x weekly - 3 sets - 30 seconds hold - Romberg Stance on Foam Pad with Head Rotation  - 1-2 x daily - 5 x weekly - 2 sets - 10 reps - Tandem Walking with Counter Support  - 1-2 x daily - 5 x weekly - 3 sets - Walking with Head Rotation  - 1-2 x daily - 5 x weekly - 3 sets - Scapular retraction with resistance  - 1 x daily - 5 x weekly - 2 sets - 10 reps - Seated Shoulder Diagonal with Resistance  - 1 x daily - 5 x weekly - 1-2 sets - 10 reps - Corner Balance Feet Together With Eyes Closed  - 1 x daily - 5 x weekly - 1 sets - 3 reps - 30 seconds hold - Standing Tandem Balance with Counter Support  - 1 x daily - 5 x weekly - 1 sets - 3 reps - 30 seconds hold - Marching with Resistance  - 1 x daily - 5 x weekly - 3 sets - 10 reps - Side Stepping with Resistance at Thighs and Counter Support  - 1 x daily - 5 x weekly - 3 sets - Forward Backward Monster Walk with Band at Thighs and Counter Support  - 1 x daily - 5 x weekly - 3 sets  GOALS: Goals reviewed with patient? Yes  SHORT TERM GOALS: Target date: 07/16/2024  Pt will be independent with initial HEP for posture, gait, balance in order to build upon functional gains made in therapy. Baseline:  IND and Compliant (7/23) Goal status: MET  2.  Pt will improve FGA to at least a 20/30 in order to demo decr fall risk. Baseline: 17/30; 17/30 (7/23) Goal status: NOT MET  3.  Pt will improve gait speed with no AD vs. LRAD to at least 2.8 ft/sec in  order to demo improved community mobility.  Baseline: 12.9 seconds with no AD = 2.54 ft/sec; 2.99 ft/sec no AD SBA (7/23) Goal status: MET  4.  ABC scale to be assessed with LTG written.   Baseline:63.75% confident  Goal status: MET   LONG TERM GOALS: Target date: 08/13/2024  Pt will be independent with final HEP for posture, gait, balance in order to build upon functional gains made in therapy. Baseline: IND and compliant, reports ongoing challenge w/ postural exercises, but balance portion needs advancement (8/13) Goal status: PARTIALLY MET  2.  Pt will improve FGA to at least a 20/30 in order to demo decr fall risk. Baseline: 17/30; 21/30 (8/13) Goal status: MET  3.  Pt will ambulate at least 500' over outdoor unlevel surfaces with LRAD with mod I in order to demo improved community mobility.  Baseline: 345' SBA SPC indoor ramp/curb/floor (8/13) Goal status: PARTIALLY MET  4.  Pt will improve ABC to at least 70% in order to demo improved confidence with balance.  Baseline: 63.75% confident; 55.625% (8/13) Goal status: NOT MET  GOALS (at 1/86 re-cert): Goals reviewed with patient? Yes  SHORT TERM GOALS = LONG TERM GOALS: Target date: 09/12/2024  Pt will be independent with advanced and finalized HEP for posture, gait, balance in order to build upon functional gains made in therapy. Baseline: IND and compliant, reports ongoing challenge w/ postural exercises, but balance portion needs advancement (8/13) Goal status: INITIAL  2.  Pt will improve FGA to at least a 25/30 in order to demo decr fall risk. Baseline: 17/30; 21/30 (8/13) Goal status: REVISED  3.  Pt will ambulate at least 500' over outdoor unlevel surfaces with LRAD with mod I in order to demo improved community mobility.  Baseline: 345' SBA SPC indoor ramp/curb/floor (8/13)  Met on 9/10 on outdoor surfaces with SPC  Goal status: MET  4.  Pt will improve ABC to at least 70% in order to demo improved confidence  with balance.  Baseline: 63.75% confident; 55.625% (8/13) Goal status: INITIAL  ASSESSMENT:  CLINICAL IMPRESSION: 10th visit PN: Pt brought in his SPC from home and practiced over outdoor surfaces like grass and pavement with pt able to ambulate with mod I and no issues. Reviewed a curb with SPC with supervision with pt able to teach back correctly. Discussed SPC would continue to be beneficial over outdoor unlevel surfaces for extra stability. Pt has met LTG #3. Remainder of session focused on balance strategies on unlevel surfaces with decr UE support. Plan to D/C at next session due to progress in PT with pt in agreement with plan.    OBJECTIVE IMPAIRMENTS: Abnormal gait, decreased activity tolerance, decreased balance, decreased coordination, decreased endurance, difficulty walking, impaired flexibility, and postural dysfunction.   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   REHAB POTENTIAL: Good  CLINICAL DECISION MAKING: Stable/uncomplicated  EVALUATION COMPLEXITY: Low  PLAN:  PT FREQUENCY: 1x/week  PT DURATION: 8 weeks + 4 weeks  PLANNED INTERVENTIONS: 97164- PT Re-evaluation, 97110-Therapeutic exercises, 97530- Therapeutic activity, 97112- Neuromuscular re-education, 97535- Self Care, [id number]- Manual therapy, [phone]- Gait training, Patient/Family education, Balance training, Stair training, Vestibular training, and DME instructions  PLAN FOR NEXT SESSION: CHECK GOALS AND D/C  EC, narrow BOS, SLS,  repeat resisted gait. Did he bring his SPC?  Blaze pods w/ floor reach?  [place] LOISE Senate, PT, DPT 09/03/2024, 2:02 PM

## 2024-09-10 ENCOUNTER — Ambulatory Visit: Admitting: Physical Therapy

## 2024-09-10 ENCOUNTER — Encounter: Payer: Self-pay | Admitting: Physical Therapy

## 2024-09-10 VITALS — BP 110/50 | HR 57

## 2024-09-10 DIAGNOSIS — R2681 Unsteadiness on feet: Secondary | ICD-10-CM | POA: Diagnosis not present

## 2024-09-10 DIAGNOSIS — R293 Abnormal posture: Secondary | ICD-10-CM

## 2024-09-10 NOTE — Therapy (Signed)
 OUTPATIENT PHYSICAL THERAPY NEURO TREATMENT - DISCHARGE SUMMARY   Patient Name: Jack James MRN: [id number] DOB:1934/10/07, 89 y.o., male Today's Date: 09/10/2024   PCP: Windy Coy, MD REFERRING PROVIDER: Windy Coy, MD  PHYSICAL THERAPY DISCHARGE SUMMARY  Visits from Start of Care: 11  Current functional level related to goals / functional outcomes: See clinical impression statement.   Remaining deficits: Kyphotic posture and forward head   Education / Equipment: D/C plan for today and progress towards goals.   Patient agrees to discharge. Patient goals were partially met. Patient is being discharged due to maximized rehab potential.    END OF SESSION:  PT End of Session - 09/10/24 0849     Visit Number 11    Number of Visits 11   7 + 4   Date for PT Re-Evaluation 09/19/24   pushed out due to scheduling delay - pt unavailable x1 week in POC   Authorization Type [employer]ARE    PT Start Time 0845    PT Stop Time 0932    PT Time Calculation (min) 47 min    Equipment Utilized During Treatment Gait belt    Activity Tolerance Patient tolerated treatment well    Behavior During Therapy [facility] for tasks assessed/performed           Past Medical History:  Diagnosis Date   Acute on chronic diastolic heart failure (HCC) 11/19/2015   Ataxia 03/12/2024   Atrial fibrillation (HCC) 01/20/2016   postoperative    Atrial fibrillation and flutter (HCC) 01/20/2016   postoperative   Bilateral sensorineural hearing loss 09/19/2023   CAD (coronary artery disease), native coronary artery 08/04/2010   Cath 2011 with severe proximal LAD stenosis treated with 2.75 x 24 mm ion stent postdilated to 3.0 mm by Dr. Wonda  Catheterization 2016 shows 40% ostial LAD, patent stent, 50% mid LAD, 60% circumflex, and 40% RCA stenosis with severe mitral regurgitation.    Chest pain 05/02/2019   Chronic diastolic CHF (congestive heart failure) (HCC)    Coronary artery  disease    a. 2011: DES to proximal LAD   Coronary artery disease involving coronary bypass graft of native heart 08/04/2010   Cath 2011 with severe proximal LAD stenosis treated with 2.75 x 24 mm ion stent postdilated to 3.0 mm by Dr. Wonda  Catheterization 2016 shows 40% ostial LAD, patent stent, 50% mid LAD, 60% circumflex, and 40% RCA stenosis with severe mitral regurgitation.   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   Other constipation 05/28/2020   Pleural effusion, bilateral 11/18/2015   S/P CABG x 3 11/25/2015   LIMA to LAD, SVG to RCA, SVG to OM2, EVH via right thigh    S/P mitral valve repair 11/25/2015   Complex valvuloplasty including quadrangular resection of posterior leaflet, artificial Gore-tex neochord placement x10 and 32 mm Sorin Memo 3D Rechord ring annuloplasty   Skin cancer    Stroke (HCC)    Unstable angina (HCC) 11/19/2015   Past Surgical History:  Procedure Laterality Date   CARDIAC CATHETERIZATION N/A 11/19/2015   Procedure: Left Heart Cath and Coronary Angiography;  Surgeon: Lonni JONETTA Cash, MD;  Location: [facility] INVASIVE CV LAB;  Service: Cardiovascular;  Laterality: N/A;   CATARACT EXTRACTION, BILATERAL  2017   CLIPPING OF ATRIAL APPENDAGE  11/25/2015   Procedure: CLIPPING OF ATRIAL APPENDAGE;  Surgeon: Sudie VEAR Laine, MD;  Location: [facility] OR;  Service: Open Heart Surgery;;   CORONARY ARTERY BYPASS GRAFT N/A 11/25/2015   Procedure: CORONARY ARTERY  BYPASS GRAFTING (CABG) TIMES THREE USING LEFT INTERNAL MAMMARY ARTERY AND RIGHT SAPHENOUS LEG VEIN HARVESTED ENDOSCOPICALLY;  Surgeon: Sudie VEAR Laine, MD;  Location: [facility] OR;  Service: Open Heart Surgery;  Laterality: N/A;   CORONARY STENT PLACEMENT  2011   DES to proximal LAD   LEFT HEART CATH AND CORS/GRAFTS ANGIOGRAPHY N/A 05/02/2019   Procedure: LEFT HEART CATH AND CORS/GRAFTS ANGIOGRAPHY;  Surgeon: Claudene Victory ORN, MD;  Location: [facility] INVASIVE CV LAB;  Service: Cardiovascular;  Laterality: N/A;   LITHOTRIPSY     MITRAL VALVE REPAIR N/A 11/25/2015   Procedure: MITRAL VALVE REPAIR (MVR);  Surgeon: Sudie VEAR Laine, MD;  Location: [facility] OR;  Service: Open Heart Surgery;  Laterality: N/A;   SKIN CANCER EXCISION     TEE WITHOUT CARDIOVERSION N/A 11/22/2015   Procedure: TRANSESOPHAGEAL ECHOCARDIOGRAM (TEE);  Surgeon: Vinie JAYSON Maxcy, MD;  Location: [facility] ENDOSCOPY;  Service: Cardiovascular;  Laterality: N/A;   TEE WITHOUT CARDIOVERSION N/A 11/25/2015   Procedure: TRANSESOPHAGEAL ECHOCARDIOGRAM (TEE);  Surgeon: Sudie VEAR Laine, MD;  Location: [facility] OR;  Service: Open Heart Surgery;  Laterality: N/A;   Patient Active Problem List   Diagnosis Date Noted   Ataxia 03/12/2024   Bilateral sensorineural hearing loss 09/19/2023   Imbalance 09/19/2023   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   History of colonic polyps 04/19/2022   Coronary artery disease    HLD (hyperlipidemia)    Mitral valve prolapse    Skin cancer    Stroke [facility])    Headache disorder 08/18/2020   Gastroesophageal reflux disease without esophagitis 05/28/2020   Other constipation 05/28/2020   Increased MCV 04/28/2020   History of CVA (cerebrovascular accident) 01/19/2020   Encounter for antineoplastic chemotherapy 12/28/2019   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   Chest pain 05/02/2019   Atrial fibrillation and flutter (HCC) 01/20/2016   Atrial fibrillation (HCC) 01/20/2016   S/P mitral valve repair + CABG x3 11/25/2015   S/P CABG x 3  11/25/2015   Unstable angina (HCC) 11/19/2015   MVP (mitral valve prolapse) 11/19/2015   Chronic diastolic CHF (congestive heart failure) (HCC) 11/19/2015   Acute on chronic diastolic heart failure (HCC) 11/19/2015   Mitral regurgitation 11/19/2015   Hyperlipidemia    Pleural effusion, bilateral 11/18/2015   Coronary artery disease involving coronary bypass graft of native heart 08/04/2010   CAD (coronary artery disease), native coronary artery 08/04/2010    ONSET DATE: 05/01/2024  REFERRING DIAG: R27.0 (ICD-10-CM) - Ataxia, unspecified  THERAPY DIAG:  Unsteadiness on feet  Abnormal posture  Rationale for Evaluation and Treatment: Rehabilitation  SUBJECTIVE:  SUBJECTIVE STATEMENT: Has not felt he needed the cane recently.  Has been walking more at home and doing his HEP daily.  No falls.   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 No  Vitals:   09/10/24 0857  BP: (!) 110/50  Pulse: (!) 57    PRECAUTIONS: Fall  FALLS: Has patient fallen in last 6 months? No  LIVING ENVIRONMENT: Lives with: lives with his son  Lives in: House/apartment Stairs: no stairs, one level  Has following equipment at home: Single point cane, Walker - 2 wheeled, and does not use them   PLOF: Independent Son helps with yardwork, just driving in town   PATIENT GOALS: Wants to see what can be worked on for his balance. Improve his posture and mobility in general   OBJECTIVE:  Note: Objective measures were completed at Evaluation  unless otherwise noted.  DIAGNOSTIC FINDINGS: MRI brain 12/2021:  IMPRESSION:  1. Mild enhancement in the right orbital fat is stable but with less T2 signal, compatible with prior radiation therapy. 2. Encephalomalacia at the site of the previous left cerebellar mass. No evidence of residual or recurrent mass. No abnormal enhancement. 3. Stable small vessel disease changes. Unenhancing subcortical T2 hyperintensity in the right frontal lobe is nonspecific but stable. Attention on follow-up. 4. Probable tiny developmental vascular anomaly and right midbrain is stable. No other significant interval changes.  MRI brain 07/10/2022:   IMPRESSION: 1. Stable postsurgical changes left posterior fossa 2. Stable enhancement right orbit 3. Stable foci abnormal white matter signal elsewhere likely small vessel disease 4. 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   COGNITION: Overall cognitive status: Within functional limits for tasks assessed   SENSATION: WFL  COORDINATION: RAM: slight dysmetria with LLE  Heel to shin: WNL    POSTURE: rounded shoulders, forward head, increased thoracic kyphosis, and flexed trunk   LOWER EXTREMITY ROM:    Slightly decr knee extension AROM   LOWER EXTREMITY MMT:    MMT Right Eval Left Eval  Hip flexion 5 5  Hip extension    Hip abduction    Hip adduction    Hip internal rotation    Hip external rotation    Knee flexion 5 5  Knee extension 5 5  Ankle dorsiflexion 5 5  Ankle plantarflexion    Ankle inversion    Ankle eversion    (Blank rows = not tested)   TRANSFERS: Sit to stand: Modified independence  Assistive device utilized: None     Stand to sit: Modified independence  Assistive device utilized: None      GAIT: Findings: Gait Characteristics: step through pattern, decreased stride length, decreased trunk rotation, and trunk flexed, Distance walked: Clinic distances, Assistive device utilized:None, Level of assistance: SBA,  and Comments: pt reporting feeling more off balance when turning quickly or having to change directions   FUNCTIONAL TESTS:  5 times sit to stand: 9.4 seconds with no UE support  10 meter walk test: 12.9 seconds with no AD = 2.54 ft/sec    M-CTSIB  Condition 1: Firm Surface, EO 30 Sec, Normal Sway  Condition 2: Firm Surface, EC 30 Sec, Normal Sway  Condition 3: Foam Surface, EO 30 Sec, Normal Sway  Condition 4: Foam Surface, EC 30 Sec, Mild Sway    [facility] PT Assessment - 09/10/24 0905       Functional Gait  Assessment   Gait assessed  Yes    Gait Level Surface Walks 20 ft in less than 7 sec but greater than 5.5 sec, uses assistive device, slower speed, mild gait deviations, or deviates 6-10 in outside of the 12 in walkway width.    Change in Gait Speed Able to smoothly change walking speed without loss of balance or gait deviation. Deviate no more than 6 in outside of the 12 in walkway width.    Gait with Horizontal Head Turns Performs head turns smoothly with slight change in gait velocity (eg, minor disruption to smooth gait path), deviates 6-10 in outside 12 in walkway width, or uses an assistive device.    Gait with Vertical Head Turns Performs head turns with no change in gait. Deviates no more than 6 in outside 12 in walkway width.    Gait and Pivot Turn Pivot turns safely in greater than 3 sec and stops with no loss of balance, or pivot turns safely within 3 sec and stops with mild imbalance, requires small steps to catch balance.    Step Over Obstacle Is able to step over 2 stacked shoe boxes taped together (9 in total height) without changing gait speed. No evidence of imbalance.    Gait with Narrow Base of Support Ambulates 4-7 steps.   6   Gait with Eyes Closed Walks 20 ft, uses assistive device, slower speed, mild gait deviations, deviates 6-10 in outside 12 in walkway width. Ambulates 20 ft in less than 9 sec but greater than 7 sec.    Ambulating Backwards Walks 20 ft, uses  assistive device, slower speed, mild gait deviations, deviates 6-10 in outside 12 in walkway width.    Steps Alternating feet, no rail.    Total Score 23    FGA comment: 23/30 = moderate fall risk           L BP in sitting prior to interventions: Vitals:   09/10/24 0857  BP: (!) 110/50  Pulse: (!) 57                                                                                                                   TREATMENT DATE: 09/10/24 Therapeutic Activity/Self Care:  Verbally reviewed HEP and answered questions related to similarity of static balance tasks. BP assessed as above - pt encouraged to log at home w/ his cuff as he is concerned about recent low readings at home 90s/50s. ABC scale:  1200/16 = 75%; son assists w/ pt answers FGA:    [facility] PT Assessment - 09/10/24 0905       Functional Gait  Assessment   Gait assessed  Yes    Gait Level Surface Walks 20 ft in less than 7 sec but greater than 5.5 sec, uses assistive device, slower speed, mild gait deviations, or deviates 6-10 in outside of  the 12 in walkway width.    Change in Gait Speed Able to smoothly change walking speed without loss of balance or gait deviation. Deviate no more than 6 in outside of the 12 in walkway width.    Gait with Horizontal Head Turns Performs head turns smoothly with slight change in gait velocity (eg, minor disruption to smooth gait path), deviates 6-10 in outside 12 in walkway width, or uses an assistive device.    Gait with Vertical Head Turns Performs head turns with no change in gait. Deviates no more than 6 in outside 12 in walkway width.    Gait and Pivot Turn Pivot turns safely in greater than 3 sec and stops with no loss of balance, or pivot turns safely within 3 sec and stops with mild imbalance, requires small steps to catch balance.    Step Over Obstacle Is able to step over 2 stacked shoe boxes taped together (9 in total height) without changing gait speed. No evidence of imbalance.     Gait with Narrow Base of Support Ambulates 4-7 steps.   6   Gait with Eyes Closed Walks 20 ft, uses assistive device, slower speed, mild gait deviations, deviates 6-10 in outside 12 in walkway width. Ambulates 20 ft in less than 9 sec but greater than 7 sec.    Ambulating Backwards Walks 20 ft, uses assistive device, slower speed, mild gait deviations, deviates 6-10 in outside 12 in walkway width.    Steps Alternating feet, no rail.    Total Score 23    FGA comment: 23/30 = moderate fall risk         -SciFit x8 minutes in multi-peaks mode up to level 7.0 w/ BUE/BLE support for dynamic HIIT style workout and endurance training.  PATIENT EDUCATION: Education details: D/C plan for today and progress towards goals. Person educated: Patient and Child(ren) Education method: Medical illustrator Education comprehension: verbalized understanding, returned demonstration, and needs further education  HOME EXERCISE PROGRAM: Access Code: [email or web address] URL: [email or web address] Date: 08/27/2024 Prepared by: Daved Bull  Exercises - Standing Balance with Eyes Closed on Foam  - 1-2 x daily - 5 x weekly - 3 sets - 30 seconds hold - Romberg Stance on Foam Pad with Head Rotation  - 1-2 x daily - 5 x weekly - 2 sets - 10 reps - Tandem Walking with Counter Support  - 1-2 x daily - 5 x weekly - 3 sets - Walking with Head Rotation  - 1-2 x daily - 5 x weekly - 3 sets - Scapular retraction with resistance  - 1 x daily - 5 x weekly - 2 sets - 10 reps - Seated Shoulder Diagonal with Resistance  - 1 x daily - 5 x weekly - 1-2 sets - 10 reps - Corner Balance Feet Together With Eyes Closed  - 1 x daily - 5 x weekly - 1 sets - 3 reps - 30 seconds hold - Standing Tandem Balance with Counter Support  - 1 x daily - 5 x weekly - 1 sets - 3 reps - 30 seconds hold - Marching with Resistance  - 1 x daily - 5 x weekly - 3 sets - 10 reps - Side Stepping with Resistance at Thighs and Counter  Support  - 1 x daily - 5 x weekly - 3 sets - Forward Backward Monster Walk with Band at Thighs and Counter Support  - 1 x daily - 5 x weekly - 3 sets  GOALS: Goals reviewed  with patient? Yes  SHORT TERM GOALS: Target date: 07/16/2024  Pt will be independent with initial HEP for posture, gait, balance in order to build upon functional gains made in therapy. Baseline:  IND and Compliant (7/23) Goal status: MET  2.  Pt will improve FGA to at least a 20/30 in order to demo decr fall risk. Baseline: 17/30; 17/30 (7/23) Goal status: NOT MET  3.  Pt will improve gait speed with no AD vs. LRAD to at least 2.8 ft/sec in order to demo improved community mobility.  Baseline: 12.9 seconds with no AD = 2.54 ft/sec; 2.99 ft/sec no AD SBA (7/23) Goal status: MET  4.  ABC scale to be assessed with LTG written.   Baseline:63.75% confident  Goal status: MET   LONG TERM GOALS: Target date: 08/13/2024  Pt will be independent with final HEP for posture, gait, balance in order to build upon functional gains made in therapy. Baseline: IND and compliant, reports ongoing challenge w/ postural exercises, but balance portion needs advancement (8/13) Goal status: PARTIALLY MET  2.  Pt will improve FGA to at least a 20/30 in order to demo decr fall risk. Baseline: 17/30; 21/30 (8/13) Goal status: MET  3.  Pt will ambulate at least 500' over outdoor unlevel surfaces with LRAD with mod I in order to demo improved community mobility.  Baseline: 345' SBA SPC indoor ramp/curb/floor (8/13) Goal status: PARTIALLY MET  4.  Pt will improve ABC to at least 70% in order to demo improved confidence with balance.  Baseline: 63.75% confident; 55.625% (8/13) Goal status: NOT MET  GOALS (at 1/86 re-cert): Goals reviewed with patient? Yes  SHORT TERM GOALS = LONG TERM GOALS: Target date: 09/12/2024  Pt will be independent with advanced and finalized HEP for posture, gait, balance in order to build upon functional  gains made in therapy. Baseline: IND and compliant, reports ongoing challenge w/ postural exercises, but balance portion needs advancement (8/13); IND and compliant (9/17) Goal status: MET  2.  Pt will improve FGA to at least a 25/30 in order to demo decr fall risk. Baseline: 17/30; 21/30 (8/13); 23/30 (9/17) Goal status: PARTIALLY MET  3.  Pt will ambulate at least 500' over outdoor unlevel surfaces with LRAD with mod I in order to demo improved community mobility.  Baseline: 345' SBA SPC indoor ramp/curb/floor (8/13)  Met on 9/10 on outdoor surfaces with SPC  Goal status: MET  4.  Pt will improve ABC to at least 70% in order to demo improved confidence with balance.  Baseline: 63.75% confident; 55.625% (8/13); 75% (9/17) Goal status: MET  ASSESSMENT:  CLINICAL IMPRESSION: Completed LTG assessment this visit in preparation for discharge as pt has made excellent progress towards goals and maintains home routine capable of sustaining progress.  He has some fixed kyphotic posture, but has portions of HEP established to support flexible components of posture for improved balance and gait mechanics.  His dynamic stability has made slow and steady improvement to 23/30 from prior 17/30.  His confidence dipped at mid-term assessment but rebounded today to 75% on ABC scale.  He has been trained on appropriate cane use as needed.  He is in agreement to plan for discharge at this time.    OBJECTIVE IMPAIRMENTS: Abnormal gait, decreased activity tolerance, decreased balance, decreased coordination, decreased endurance, difficulty walking, impaired flexibility, and postural dysfunction.   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   REHAB POTENTIAL: Good  CLINICAL DECISION MAKING: Stable/uncomplicated  EVALUATION COMPLEXITY: Low  PLAN:  PT FREQUENCY: 1x/week  PT DURATION: 8 weeks + 4 weeks  PLANNED INTERVENTIONS: 97164- PT Re-evaluation, 97110-Therapeutic exercises, 97530- Therapeutic activity, [id number]- Neuromuscular re-education, 97535- Self Care, [id number]- Manual therapy, [phone]- Gait training, Patient/Family education, Balance training, Stair training, Vestibular training, and DME instructions  PLAN FOR NEXT SESSION: N/A  Daved KATHEE Bull, PT, DPT 09/10/2024, 9:37 AM

## 2024-09-15 ENCOUNTER — Telehealth: Payer: Self-pay | Admitting: Neurology

## 2024-09-15 NOTE — Telephone Encounter (Signed)
 MYC canc

## 2024-09-17 ENCOUNTER — Institutional Professional Consult (permissible substitution): Admitting: Neurology

## 2024-09-17 ENCOUNTER — Ambulatory Visit
Admission: RE | Admit: 2024-09-17 | Discharge: 2024-09-17 | Disposition: A | Discharge status: Home / Self Care | Source: Ambulatory Visit | Attending: Family Medicine | Admitting: Family Medicine

## 2024-09-17 ENCOUNTER — Other Ambulatory Visit: Payer: Self-pay | Admitting: Family Medicine

## 2024-09-17 DIAGNOSIS — M810 Age-related osteoporosis without current pathological fracture: Secondary | ICD-10-CM

## 2024-09-17 DIAGNOSIS — M40209 Unspecified kyphosis, site unspecified: Secondary | ICD-10-CM

## 2024-09-18 ENCOUNTER — Telehealth: Payer: Self-pay | Admitting: Cardiology

## 2024-09-18 NOTE — Telephone Encounter (Signed)
 Pt c/o medication issue:  1. Name of Medication: Calcium  carbonate 600mg    2. How are you currently taking this medication (dosage and times per day)? Takes 1 tablet 2 times daily  3. Are you having a reaction (difficulty breathing--STAT)? No  4. What is your medication issue? Pt is wanting to know if it's okay for him to take this dietary supplement due to him heart complications. Please advise

## 2024-09-19 NOTE — Telephone Encounter (Signed)
 Called the patient and informed him that Dr. Monetta was ok with him starting the calcium  carbonate supplement. Patient verbalized understanding and had no further questions at this time.

## 2024-09-22 ENCOUNTER — Other Ambulatory Visit: Payer: Self-pay | Admitting: Cardiology

## 2024-10-01 ENCOUNTER — Telehealth: Payer: Self-pay | Admitting: Neurology

## 2024-10-01 ENCOUNTER — Encounter: Payer: Self-pay | Admitting: Neurology

## 2024-10-01 NOTE — Telephone Encounter (Signed)
 LVM and sent mychart msg informing pt of need to reschedule 01/07/25 appt - MD departure

## 2025-01-01 NOTE — Progress Notes (Signed)
 "  Initial neurology clinic note  Reason for Evaluation: Consultation requested by Jack Coy, MD for an opinion regarding ataxia. My final recommendations will be communicated back to the requesting physician by way of shared medical record or letter to requesting physician via [place]  mail.  HPI: This is [name], a 89 y.o. 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 The patient is accompanied by his son.  Patient thinks symptoms started at least a couple of years ago. He noticed his walking was off and had imbalance. He mentions walking and turning to look at something and losing his balance. He thinks this has progressively gotten worse. Over the last couple of years he has also noticed a forward posture, he relates to kyphosis, that have contributed to symptoms. He denies any significant falls. He walks with no assistance.  He has been to neuro PT for balance and gait. He is not sure if it has helped, but son and PT think it may have at least stabilized him. Son states that when he normally walks, he does not see much imbalance until he turns his head when walking.  Per PCP notes, he has had this for several years probably secondary to cerebellar metastases associated with a right intraocular lymphoma. Patient follows with [facility] and is in remission. PCP felt he had symptoms when turning his head and wondered about vestibular dysfunction. He was seen by [facility] vestibular clinic in 02/2024 and did not feel he had vestibular dysfunction. PCP wanted to get him in the PT to do [place]. PT evaluated him and did not think he needed this.  Patient denies any freezing or slow movement. He denies tremors. He denies any difficulty with smell. He denies any concern for  REM sleep disorder. He denies any concerns for fevers, night sweats, or weight loss.  Of note, patient is on Plavix  for old strokes (midbrain).  Right intraocular lymphoma hx: Dx in 2020 Radiation to right eye with chemotherapy (MTR chemo regimen is a combination therapy using Methotrexate, Temozolomide, and Rituximab) Noted to have metastasis in left cerebellum  EtOH use: 1 glass of wine daily or every other day  Restrictive diet? No Family history of neurologic disease? no   MEDICATIONS:  Outpatient Encounter Medications as of 01/14/2025  Medication Sig   acebutolol  (SECTRAL ) 200 MG capsule TAKE 1 CAPSULE BY MOUTH EVERY  OTHER DAY   acetaminophen  (TYLENOL ) 500 MG tablet Take 500 mg by mouth daily as needed for pain.   atorvastatin  (LIPITOR) 20 MG tablet Take 20 mg by mouth daily.   amoxicillin (AMOXIL) 500 MG capsule Take 2,000 mg by mouth See admin instructions. Take 2000 mg 1 hour prior to dental work (Patient not taking: Reported on 01/14/2025)   calcium  carbonate (TUMS EX) 750 MG chewable tablet Chew 1,500 mg by mouth 3 (three) times daily as needed for indigestion.   cholecalciferol  (VITAMIN D ) 1000 UNITS tablet Take 1,000 Units by mouth 3 (three) times daily.   clopidogrel  (PLAVIX ) 75 MG tablet Take 75 mg by mouth daily.    Cyanocobalamin (B-12 PO) Place 1 tablet under the tongue daily.   famotidine  (PEPCID ) 10 MG tablet Take 10 mg by mouth as needed for heartburn or indigestion.   fluocinonide cream (LIDEX) 0.05 % Apply 1 Application topically 2 (  two) times daily.   isosorbide  mononitrate (IMDUR ) 30 MG 24 hr tablet TAKE 1 TABLET BY MOUTH DAILY   nitroGLYCERIN  (NITROSTAT ) 0.4 MG SL tablet Take 1 tablet 10-15 minutes before walking in the morning. (Patient taking differently: Place 0.4 mg under the tongue every 5 (five) minutes as needed for chest pain.)   polyethylene glycol powder (GLYCOLAX/MIRALAX) 17 GM/SCOOP powder Take 17 g by mouth as needed for constipation.   ranolazine   (RANEXA ) 500 MG 12 hr tablet TAKE 1 TABLET BY MOUTH TWICE  DAILY   SENEXON-S 8.6-50 MG tablet Take 2 tablets by mouth 2 (two) times daily as needed for constipation.   triamcinolone  cream (KENALOG ) 0.1 % Apply 1 application topically in the morning and at bedtime.   No facility-administered encounter medications on file as of 01/14/2025.    PAST MEDICAL HISTORY: Past Medical History:  Diagnosis Date   Acute on chronic diastolic heart failure (HCC) 11/19/2015   Ataxia 03/12/2024   Atrial fibrillation (HCC) 01/20/2016   postoperative    Atrial fibrillation and flutter (HCC) 01/20/2016   postoperative   Bilateral sensorineural hearing loss 09/19/2023   CAD (coronary artery disease), native coronary artery 08/04/2010   Cath 2011 with severe proximal LAD stenosis treated with 2.75 x 24 mm ion stent postdilated to 3.0 mm by Dr. Wonda  Catheterization 2016 shows 40% ostial LAD, patent stent, 50% mid LAD, 60% circumflex, and 40% RCA stenosis with severe mitral regurgitation.    Chest pain 05/02/2019   Chronic diastolic CHF (congestive heart failure) (HCC)    Coronary artery disease    a. 2011: DES to proximal LAD   Coronary artery disease involving coronary bypass graft of native heart 08/04/2010   Cath 2011 with severe proximal LAD stenosis treated with 2.75 x 24 mm ion stent postdilated to 3.0 mm by Dr. Wonda  Catheterization 2016 shows 40% ostial LAD, patent stent, 50% mid LAD, 60% circumflex, and 40% RCA stenosis with severe mitral regurgitation.   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   Other constipation 05/28/2020   Pleural effusion, bilateral 11/18/2015   S/P CABG x 3 11/25/2015   LIMA to LAD, SVG to RCA, SVG to OM2, EVH via right thigh    S/P mitral valve repair 11/25/2015   Complex valvuloplasty including quadrangular resection of posterior leaflet, artificial Gore-tex neochord placement x10 and 32 mm Sorin Memo 3D Rechord ring annuloplasty   Skin cancer    Stroke (HCC)    Unstable angina (HCC) 11/19/2015    PAST SURGICAL HISTORY: Past Surgical History:  Procedure Laterality Date   CARDIAC CATHETERIZATION N/A 11/19/2015   Procedure: Left Heart Cath and Coronary Angiography;  Surgeon: Lonni JONETTA Cash, MD;  [facility] INVASIVE CV LAB;  Service: Cardiovascular;  Laterality: N/A;   CATARACT EXTRACTION, BILATERAL  2017   CLIPPING OF ATRIAL APPENDAGE  11/25/2015   Procedure: CLIPPING OF ATRIAL APPENDAGE;  Surgeon: Sudie VEAR Laine, MD;  Location: [facility] OR;  Service: Open Heart Surgery;;   CORONARY ARTERY  BYPASS GRAFT N/A 11/25/2015   Procedure: CORONARY ARTERY BYPASS GRAFTING (CABG) TIMES THREE USING LEFT INTERNAL MAMMARY ARTERY AND RIGHT SAPHENOUS LEG VEIN HARVESTED ENDOSCOPICALLY;  Surgeon: Sudie VEAR Laine, MD;  Location: [facility] OR;  Service: Open Heart Surgery;  Laterality: N/A;   CORONARY STENT PLACEMENT  2011   DES to proximal LAD   LEFT HEART CATH AND CORS/GRAFTS ANGIOGRAPHY N/A 05/02/2019   Procedure: LEFT HEART CATH AND CORS/GRAFTS ANGIOGRAPHY;  Surgeon: Claudene Victory ORN, MD;  Location: [facility] INVASIVE CV LAB;  Service: Cardiovascular;  Laterality: N/A;   LITHOTRIPSY     MITRAL VALVE REPAIR N/A 11/25/2015   Procedure: MITRAL VALVE REPAIR (MVR);  Surgeon: Sudie VEAR Laine, MD;  Location[facility];  Service: Open Heart Surgery;  Laterality: N/A;   SKIN CANCER EXCISION     TEE WITHOUT CARDIOVERSION N/A 11/22/2015   Procedure: TRANSESOPHAGEAL ECHOCARDIOGRAM (TEE);   Surgeon: Vinie JAYSON Maxcy, MD;  Location: [facility] ENDOSCOPY;  Service: Cardiovascular;  Laterality: N/A;   TEE WITHOUT CARDIOVERSION N/A 11/25/2015   Procedure: TRANSESOPHAGEAL ECHOCARDIOGRAM (TEE);  Surgeon: Sudie VEAR Laine, MD;  Location: [facility] OR;  Service: Open Heart Surgery;  Laterality: N/A;    ALLERGIES: Allergies[1]  FAMILY HISTORY: Family History  Problem Relation Age of Onset   Heart failure Mother    Stroke Sister    Lung cancer Brother     SOCIAL HISTORY: Social History[2] Social History   Social History Narrative   Lives at home alone part time son live with him   Right-handed   Caffeine: 2 mugs of decaf coffee per day, green tea     OBJECTIVE: PHYSICAL EXAM: BP (!) 154/74   Pulse 64   Ht 5' 10 (1.778 m)   Wt 158 lb (71.7 kg)   SpO2 98%   BMI 22.67 kg/m   General: General appearance: Awake and alert. No distress. Cooperative with exam.  Skin: No obvious rash or jaundice. HEENT: Atraumatic. Anicteric. Lungs: Non-labored breathing on room air  Heart: Regular Extremities: No edema. No obvious deformity.  Psych: Affect appropriate.  Neurological: Mental Status: Alert. Speech fluent. No pseudobulbar affect Cranial Nerves: CNII: No RAPD. Visual fields grossly intact. CNIII, IV, VI: PERRL. No nystagmus. EOMI. CN V: Facial sensation intact bilaterally to fine touch. CN VII: Facial muscles symmetric and strong. No ptosis at rest. CN VIII: Hearing grossly intact bilaterally. CN IX: No hypophonia. CN X: Palate elevates symmetrically. CN XI: Full strength shoulder shrug bilaterally. CN XII: Tongue protrusion full and midline. No atrophy or fasciculations. No significant dysarthria Motor: Tone is normal. No fasciculations in any extremities. No atrophy.  Individual muscle group testing (MRC grade out of 5):  Movement     Neck flexion 5    Neck extension 5     Right Left   Shoulder abduction 5 5   Elbow flexion 5 5   Elbow extension 5 5   Finger  abduction - FDI 5 5   Finger abduction - ADM 5 5   Finger extension 5 5   Finger distal flexion - 2/3 5 5    Finger distal flexion - 4/5 5 5    Thumb flexion - FPL 5 5   Thumb abduction - APB 5 5    Hip flexion 5 5   Hip extension 5 5   Hip adduction 5 5   Hip abduction 5 5   Knee extension 5 5   Knee flexion 5 5   Dorsiflexion 5 5   Plantarflexion 5 5  Reflexes:  Right Left   Bicep 2-3+ 2-3+   Tricep 2-3+ 2-3+   BrRad 2-3+ 2-3+   Knee 2-3+ 2-3+ Cross adductors bilaterally  Ankle 0 0    Pathological Reflexes: Babinski: mute response bilaterally Hoffman: absent bilaterally Troemner: absent bilaterally Sensation: Pinprick: Intact in all extremities Vibration: Intact in all extremities Proprioception: Intact in bilateral great toes Coordination: Intact finger-to- nose-finger on right, mild dysmetria on left. RAM with dysdiadochokinesia on left, normal on right Romberg with sway. Gait: Able to rise from chair with arms crossed unassisted. Narrowed based gait. Imbalance, particularly with turns. Poor tandem walking  Lab and Test Review: Internal labs: CSF studies (09/17/2019): Opening pressure 11 cm of water Cytology negative 1 RBC, 2 WBC, 39 protein, 52 glucose CSF culture negative  External labs: 09/03/24: Lipid panel: tChol 121, LDL 35, TG 42 Vit D wnl TSH wnl HbA1c: 5.3 CBC significant for MCV 100 CMP unremarkable  05/26/20: B12: low at 236  Imaging/Procedures: MRI brain and orbits (12/06/2016): IMPRESSION: 1. 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 2. Otherwise normal MRI of the brain and orbits for patient age. No other findings to explain patient's symptoms identified.  CTA head and neck (02/09/2017): IMPRESSION: Unremarkable CTA of the head neck for age. There is mild atherosclerosis without stenosis or ulceration.  MRI brain and orbits w/wo contrast (09/08/2019): Per my read: hyperintensity in left  cerebellum.  Radiology read: IMPRESSION: 1. Normal MRI appearance of the orbits. No acute or focal enhancement or mass lesion. 2. Normal MRI the brain for age.  MRI orbits (12/16/2019 - external): IMPRESSION:  1. Large enhancing mass in the left cerebellum concerning for progressive  lymphoma.  2. Normal MRI of the orbits.   MRI brain w/wo contrast (03/15/20 - external): IMPRESSION:  1. At the site of previously identified left cerebellar mass, there is at  most minimal, punctate enhancement remaining. T2 FLAIR hyperintensity of  the region is slightly improved.  2. Stable enhancing 3.5 mm structure in the right cerebral peduncle as  compared with initial study of 12/16/2019. Given appearance and associated  susceptibility artifact, this likely relates to a small vascular anomaly.  Continued follow-up however should be considered.   MRI brain w/wo contrast (07/10/22): IMPRESSION:  1. Stable postsurgical changes left posterior fossa  2. Stable enhancement right orbit  3. Stable foci abnormal white matter signal elsewhere likely small vessel  disease  4. 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   MRI orbits w/wo contrast (07/10/24 - external): IMPRESSION:  1. No orbital enhancement to suggest recurrent disease.  2.  Stable posttreatment changes in the left cerebellum. No new sites of  disease.   Thoracic xray (09/17/24): IMPRESSION: Mild kyphosis with mild multilevel degenerative disc disease.  MRI orbits w/wo contrast (01/06/25); FINDINGS:  Globes: Normal.  Lenses: Bilateral aphakia.  Optic Nerves: Normal. No abnormal enhancement.  Intraconal Fat: Normal.  Extraocular Muscles: Normal.  Intraorbital Vasculature: Normal.  Lacrimal Glands: Normal. No abnormal enhancement.   Calvarium, scalp: No abnormalities are noted.  Dura; Ventricles, cisterns and sulci; Brain parenchyma: Left middle cranial  fossa arachnoid cyst is unchanged. Signal changes in the periventricular   and subcortical white matter consistent with mild chronic microangiopathy.  No acute infarct or hemorrhage. Encephalomalacia and signal changes of the left anterolateral cerebellar hemisphere are stable consistent with  posttreatment related changes. There is no abnormal enhancement.  Extra-axial fluid collections: None noted.   Sinuses: Visualized portions are unremarkable.  Intracranial vasculature: No abnormalities noted.   IMPRESSION: Stable finding involving the left cerebellum without evidence for disease progression. No acute findings.   External vestibular testing (03/12/24):   ASSESSMENT: Jack James is a 89 y.o. male who presents for evaluation of imbalance. 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 His neurological examination is pertinent for dysmetria and dysdiadochokinesia on the left. He also has gait ataxia particularly with turning. Available diagnostic data is significant for normal vestibular testing and MRI brain with left cerebellar lesion that has been stable since 2020. 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 There is no evidence of sensory ataxia. Patient does have possible hyperreflexia, so cervical stenosis causing myelopathy is another possible contributing etiology. A vitamin deficiency that mimics myelopathy is also possible. I will get labs and MRI cervical spine to further evaluate for a treatable cause of his symptoms.  PLAN: -Blood work: B1, B12, folate, MMA, copper, vit E -MRI cervical spine wo contrast -Continue PT  -Return to clinic as needed  The impression above as well as the plan as outlined below were extensively discussed with the patient (in the company of son) who voiced understanding. All questions were answered to their  satisfaction.  The patient was counseled on pertinent fall precautions per the printed material provided today, and as noted under the Patient Instructions section below.  When available, results of the above investigations and possible further recommendations will be communicated to the patient via telephone/MyChart. Patient to call office if not contacted after expected testing turnaround time.   Total time spent reviewing records, interview, history/exam, documentation, and coordination of care on day of encounter:  70 min   Thank you for allowing me to participate in patient's care.  If I can answer any additional questions, I would be pleased to do so.  Venetia Potters, MD   CC: Jack Coy, MD [address] [place] [address]  CC: Referring provider: Windy Coy, MD [address] [place],  [place] [address]     [1]  Allergies Allergen Reactions   Sulfa Antibiotics Rash   Sulfacetamide Sodium Rash    Per Pt Per Pt  [2]  Social History Tobacco Use   Smoking status: Never   Smokeless tobacco: Never  Vaping Use   Vaping status: Never Used  Substance Use Topics   Alcohol use: Yes    Alcohol/week: 1.0 standard drink of alcohol    Types: 1 Glasses of wine per week    Comment: 1 glass of wine 3 - 4 times per week    Drug use: No   "

## 2025-01-07 ENCOUNTER — Institutional Professional Consult (permissible substitution): Admitting: Neurology

## 2025-01-14 ENCOUNTER — Ambulatory Visit: Admitting: Neurology

## 2025-01-14 ENCOUNTER — Encounter: Payer: Self-pay | Admitting: Neurology

## 2025-01-14 ENCOUNTER — Other Ambulatory Visit

## 2025-01-14 VITALS — BP 140/72 | HR 64 | Ht 70.0 in | Wt 158.0 lb

## 2025-01-14 DIAGNOSIS — C7931 Secondary malignant neoplasm of brain: Secondary | ICD-10-CM | POA: Diagnosis not present

## 2025-01-14 DIAGNOSIS — R270 Ataxia, unspecified: Secondary | ICD-10-CM | POA: Diagnosis not present

## 2025-01-14 DIAGNOSIS — R292 Abnormal reflex: Secondary | ICD-10-CM | POA: Diagnosis not present

## 2025-01-14 NOTE — Patient Instructions (Addendum)
 I saw you today for imbalance. I think this is due to the cerebellar mass from your intraocular lymphoma, but I want to check for treatable causes that could be contributing to symptoms. -Blood work today -MRI of your cervical spine. A referral to [facility]) has been placed for your MRI someone will contact you directly to schedule your appt. They are located at [address] [facility]. Please contact them directly by calling [phone] with any questions regarding your referral.  If you do not hear from someone to schedule in 1-2 weeks, please let me know.  I will be in touch when I have those results to determine what if anything needs to be done.  Continue PT for as long as you can.  The physicians and staff at [facility] are committed to providing excellent care. You may receive a survey requesting feedback about your experience at our office. We strive to receive very good responses to the survey questions. If you feel that your experience would prevent you from giving the office a very good  response, please contact our office to try to remedy the situation. We may be reached at [phone]. Thank you for taking the time out of your busy day to complete the survey.  Venetia Potters, MD [facility]  Preventing Falls at [facility] are common, often dreaded events in the lives of older people. Aside from the obvious injuries and even death that may result, fall can cause wide-ranging consequences including loss of independence, mental decline, decreased activity and mobility. Younger people are also at risk of falling, especially those with chronic illnesses and fatigue.  Ways to reduce risk for falling Examine diet and medications. Warm foods and alcohol dilate blood vessels, which can lead to dizziness when standing. Sleep aids, antidepressants and pain medications can also increase the likelihood of a fall.  Get a vision exam. Poor vision, cataracts and  glaucoma increase the chances of falling.  Check foot gear. Shoes should fit snugly and have a sturdy, nonskid sole and a broad, low heel  Participate in a physician-approved exercise program to build and maintain muscle strength and improve balance and coordination. Programs that use ankle weights or stretch bands are excellent for muscle-strengthening. Water aerobics programs and low-impact Tai Chi programs have also been shown to improve balance and coordination.  Increase vitamin D  intake. Vitamin D  improves muscle strength and increases the amount of calcium  the body is able to absorb and deposit in bones.  How to prevent falls from common hazards Floors - Remove all loose wires, cords, and throw rugs. Minimize clutter. Make sure rugs are anchored and smooth. Keep furniture in its usual place.  Chairs -- Use chairs with straight backs, armrests and firm seats. Add firm cushions to existing pieces to add height.  Bathroom - Install grab bars and non-skid tape in the tub or shower. Use a bathtub transfer bench or a shower chair with a back support Use an elevated toilet seat and/or safety rails to assist standing from a low surface. Do not use towel racks or bathroom tissue holders to help you stand.  Lighting - Make sure halls, stairways, and entrances are well-lit. Install a night light in your bathroom or hallway. Make sure there is a light switch at the top and bottom of the staircase. Turn lights on if you get up in the middle of the night. Make sure lamps or light switches are within reach of the bed if you  have to get up during the night.  Kitchen - Install non-skid rubber mats near the sink and stove. Clean spills immediately. Store frequently used utensils, pots, pans between waist and eye level. This helps prevent reaching and bending. Sit when getting things out of lower cupboards.  Living room/ Bedrooms - Place furniture with wide spaces in between, giving enough room to move  around. Establish a route through the living room that gives you something to hold onto as you walk.  Stairs - Make sure treads, rails, and rugs are secure. Install a rail on both sides of the stairs. If stairs are a threat, it might be helpful to arrange most of your activities on the lower level to reduce the number of times you must climb the stairs.  Entrances and doorways - Install metal handles on the walls adjacent to the doorknobs of all doors to make it more secure as you travel through the doorway.  Tips for maintaining balance Keep at least one hand free at all times. Try using a backpack or fanny pack to hold things rather than carrying them in your hands. Never carry objects in both hands when walking as this interferes with keeping your balance.  Attempt to swing both arms from front to back while walking. This might require a conscious effort if Parkinson's disease has diminished your movement. It will, however, help you to maintain balance and posture, and reduce fatigue.  Consciously lift your feet off of the ground when walking. Shuffling and dragging of the feet is a common culprit in losing your balance.  When trying to navigate turns, use a U technique of facing forward and making a wide turn, rather than pivoting sharply.  Try to stand with your feet shoulder-length apart. When your feet are close together for any length of time, you increase your risk of losing your balance and falling.  Do one thing at a time. Don't try to walk and accomplish another task, such as reading or looking around. The decrease in your automatic reflexes complicates motor function, so the less distraction, the better.  Do not wear rubber or gripping soled shoes, they might catch on the floor and cause tripping.  Move slowly when changing positions. Use deliberate, concentrated movements and, if needed, use a grab bar or walking aid. Count 15 seconds between each movement. For example, when  rising from a seated position, wait 15 seconds after standing to begin walking.  If balance is a continuous problem, you might want to consider a walking aid such as a cane, walking stick, or walker. Once you've mastered walking with help, you might be ready to try it on your own again.

## 2025-01-23 ENCOUNTER — Other Ambulatory Visit: Payer: Self-pay | Admitting: Cardiology

## 2025-01-23 ENCOUNTER — Ambulatory Visit: Payer: Self-pay | Admitting: Neurology

## 2025-01-23 LAB — COPPER, SERUM: Copper: 78 ug/dL (ref 70–175)

## 2025-01-23 LAB — VITAMIN B12: Vitamin B-12: >2000 pg/mL — ABNORMAL HIGH (ref 200–1100)

## 2025-01-23 LAB — METHYLMALONIC ACID, SERUM

## 2025-01-23 LAB — FOLATE: Folate: >24 ng/mL

## 2025-01-23 LAB — VITAMIN B1: Vitamin B1 (Thiamine): 27 nmol/L (ref 8–30)

## 2025-01-23 LAB — VITAMIN E
Gamma-Tocopherol (Vit E): 1.3 mg/L
Vitamin E (Alpha Tocopherol): 7.1 mg/L (ref 5.7–19.9)

## 2025-01-27 ENCOUNTER — Telehealth: Payer: Self-pay | Admitting: Neurology

## 2025-01-28 ENCOUNTER — Telehealth: Payer: Self-pay

## 2025-01-28 NOTE — Telephone Encounter (Signed)
 Called pt and he will be coming to redo the MMA order due to a error in the lab. I let him know that I spoke with Dr. Leigh and his labs were good to reassure him again.( Accept the B12 is high) he understood.

## 2025-01-28 NOTE — Telephone Encounter (Signed)
 Called and talked to Mr Bensman x2. First day 12/27/2024 reported labs per Dr. Leigh and he still questioned the results and so I reported it to Dr. Leigh and he confirmed results had been seen and he reported the results again to me. On 01/28/2025 [facility] called and reported the error on the MMA lab and that pt would need to repeat it. I called and reported the Labs report and he understood and will be in next week to have labs repeated.

## 2025-02-18 ENCOUNTER — Other Ambulatory Visit

## 2025-05-13 ENCOUNTER — Ambulatory Visit: Admitting: Cardiology

## 2025-05-20 ENCOUNTER — Ambulatory Visit: Admitting: Cardiology
